# Patient Record
Sex: Female | Born: 1950 | Race: White | Marital: Married | Smoking: Never smoker
Health system: Southern US, Community
[De-identification: ages and names within clinical notes are randomized; demographics above are authoritative.]

## PROBLEM LIST (undated history)

## (undated) DIAGNOSIS — K5790 Diverticulosis of intestine, part unspecified, without perforation or abscess without bleeding: Secondary | ICD-10-CM

## (undated) DIAGNOSIS — E782 Mixed hyperlipidemia: Secondary | ICD-10-CM

## (undated) DIAGNOSIS — M810 Age-related osteoporosis without current pathological fracture: Secondary | ICD-10-CM

## (undated) DIAGNOSIS — F419 Anxiety disorder, unspecified: Secondary | ICD-10-CM

## (undated) DIAGNOSIS — K219 Gastro-esophageal reflux disease without esophagitis: Secondary | ICD-10-CM

## (undated) DIAGNOSIS — E559 Vitamin D deficiency, unspecified: Secondary | ICD-10-CM

## (undated) DIAGNOSIS — T7840XA Allergy, unspecified, initial encounter: Secondary | ICD-10-CM

## (undated) DIAGNOSIS — E039 Hypothyroidism, unspecified: Secondary | ICD-10-CM

## (undated) DIAGNOSIS — R011 Cardiac murmur, unspecified: Secondary | ICD-10-CM

## (undated) DIAGNOSIS — Z8719 Personal history of other diseases of the digestive system: Secondary | ICD-10-CM

## (undated) HISTORY — DX: Vitamin D deficiency, unspecified: E55.9

## (undated) HISTORY — DX: Gastro-esophageal reflux disease without esophagitis: K21.9

## (undated) HISTORY — DX: Cardiac murmur, unspecified: R01.1

## (undated) HISTORY — DX: Age-related osteoporosis without current pathological fracture: M81.0

## (undated) HISTORY — PX: TONSILLECTOMY: SUR1361

## (undated) HISTORY — DX: Mixed hyperlipidemia: E78.2

## (undated) HISTORY — DX: Anxiety disorder, unspecified: F41.9

## (undated) HISTORY — DX: Diverticulosis of intestine, part unspecified, without perforation or abscess without bleeding: K57.90

## (undated) HISTORY — DX: Allergy, unspecified, initial encounter: T78.40XA

## (undated) HISTORY — PX: EYE SURGERY: SHX253

## (undated) HISTORY — PX: BREAST BIOPSY: SHX20

## (undated) HISTORY — DX: Hypothyroidism, unspecified: E03.9

## (undated) HISTORY — DX: Personal history of other diseases of the digestive system: Z87.19

---
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
## 1998-08-28 DIAGNOSIS — E039 Hypothyroidism, unspecified: Secondary | ICD-10-CM

## 1998-08-28 HISTORY — DX: Hypothyroidism, unspecified: E03.9

## 2016-07-10 ENCOUNTER — Ambulatory Visit (INDEPENDENT_AMBULATORY_CARE_PROVIDER_SITE_OTHER): Payer: 59 | Admitting: Internal Medicine

## 2016-07-10 ENCOUNTER — Encounter: Payer: Self-pay | Admitting: Internal Medicine

## 2016-07-10 VITALS — BP 110/68 | HR 71 | Temp 97.5°F | Resp 16 | Ht 60.5 in | Wt 107.8 lb

## 2016-07-10 DIAGNOSIS — E559 Vitamin D deficiency, unspecified: Secondary | ICD-10-CM | POA: Diagnosis not present

## 2016-07-10 DIAGNOSIS — Z79899 Other long term (current) drug therapy: Secondary | ICD-10-CM

## 2016-07-10 DIAGNOSIS — R5383 Other fatigue: Secondary | ICD-10-CM

## 2016-07-10 DIAGNOSIS — E782 Mixed hyperlipidemia: Secondary | ICD-10-CM | POA: Diagnosis not present

## 2016-07-10 DIAGNOSIS — Z131 Encounter for screening for diabetes mellitus: Secondary | ICD-10-CM

## 2016-07-10 DIAGNOSIS — R030 Elevated blood-pressure reading, without diagnosis of hypertension: Secondary | ICD-10-CM

## 2016-07-10 DIAGNOSIS — E039 Hypothyroidism, unspecified: Secondary | ICD-10-CM | POA: Diagnosis not present

## 2016-07-10 DIAGNOSIS — Z0001 Encounter for general adult medical examination with abnormal findings: Secondary | ICD-10-CM

## 2016-07-10 NOTE — Patient Instructions (Signed)

## 2016-07-10 NOTE — Progress Notes (Signed)
[facility] Unk Pinto, M.D.    Isabel Tyler. Silverio Lay, P.A.-C      Starlyn Skeans, P.A.-C  [facility]                [address] [place], [place] [id number] Telephone [phone] Telefax [phone]  Preventative Visit & Comprehensive Evaluation &  Examination     This very nice 65 y.o. MWF presents to establish for evaluation & management of multiple medical co-morbidities. Patient has recently move her from [place] to be close to a daughter and new granddaughter. She has been on Thyroid replacement since circa 2000. She also relates she has been on Lexapro about 15 years and desires to continue it. She also relates hx/o treatment several times for Diverticulitis since 2012 with the last episode 6-12 months ago.      Patient denies k/o elevated BP and patient denies any cardiac symptoms as chest pain, palpitations, shortness of breath, dizziness or ankle swelling. She relates dx/o "Mitral Valve Prolapse" since 1981 and has had 2 nuclear stress tests negative in the past with the most recent in 2015. Today's BP is 110/68 at goal.     Patient does relate hx/o elevated lipid parameters, but reports no treatment was recommended other than diet.      Patient denies k/o elevated blood sugar and patient denies reactive hypoglycemic symptoms, visual blurring, diabetic polys, or paresthesias.        Finally, patient denies k/o ever having her Vitamin D level checked. Outpatient Encounter Prescriptions as of 07/10/2016  Medication Sig  . aspirin 81 MG Chew by mouth daily.  Marland Kitchen LEXAPRO 20 MG tablet Take 20 mg by mouth daily.  Marland Kitchen FLAXSEED OIL  Take 1,000 mg by mouth.  . levothyroxine  50 MCG tablet Take 50 mcg by mouth daily before breakfast.  . Omega-3  FISH OIL 1000 MG  Take by mouth.   Allergies - none PMH (+) Hypothyroidism, Hyperlipidemia, Seasonal Allergic Rhinitis  No past surgical history on file.    Social History  Substance Use Topics  . Smoking status: Never Smoker  . Smokeless tobacco: Not on file  . Alcohol use Not on file    ROS Constitutional: Denies fever, chills, weight loss/gain, headaches, insomnia,  night sweats, and change in appetite. Does c/o fatigue. Eyes: Denies redness, blurred vision, diplopia, discharge, itchy, watery eyes.  ENT: Denies discharge, congestion, post nasal drip, epistaxis, sore throat, earache, hearing loss, dental pain, Tinnitus, Vertigo, Sinus pain, snoring.  Cardio: Denies chest pain, palpitations, irregular heartbeat, syncope, dyspnea, diaphoresis, orthopnea, PND, claudication, edema Respiratory: denies cough, dyspnea, DOE, pleurisy, hoarseness, laryngitis, wheezing.  Gastrointestinal: Denies dysphagia, heartburn, reflux, water brash, pain, cramps, nausea, vomiting, bloating, diarrhea, constipation, hematemesis, melena, hematochezia, jaundice, hemorrhoids Genitourinary: Denies dysuria, frequency, urgency, nocturia, hesitancy, discharge, hematuria, flank pain Breast: Breast lumps, nipple discharge, bleeding.  Musculoskeletal: Denies arthralgia, myalgia, stiffness, Jt. Swelling, pain, limp, and strain/sprain. Denies falls. Skin: Denies puritis, rash, hives, warts, acne, eczema, changing in skin lesion Neuro: No weakness, tremor, incoordination, spasms, paresthesia, pain Psychiatric: Denies confusion, memory loss, sensory loss. Denies Depression. Endocrine: Denies change in weight, skin, hair change, nocturia, and paresthesia, diabetic polys, visual blurring, hyper / hypo glycemic episodes.  Heme/Lymph: No excessive bleeding, bruising, enlarged lymph nodes.  Physical Exam  BP 110/68   Pulse 71   Temp 97.5 F (36.4 C)   Resp  16   Ht 5' 0.5" (1.537 m)   Wt 107 lb 12.8 oz (48.9 kg)   SpO2 95%   BMI 20.71 kg/m   General Appearance: Well nourished and in no apparent distress.  Eyes: PERRLA, EOMs, conjunctiva no swelling or erythema, normal  fundi and vessels. Sinuses: No frontal/maxillary tenderness ENT/Mouth: EACs patent / TMs  nl. Nares clear without erythema, swelling, mucoid exudates. Oral hygiene is good. No erythema, swelling, or exudate. Tongue normal, non-obstructing. Tonsils not swollen or erythematous. Hearing normal.  Neck: Supple, thyroid normal. No bruits, nodes or JVD. Respiratory: Respiratory effort normal.  BS equal and clear bilateral without rales, rhonci, wheezing or stridor. Cardio: Heart sounds are normal with regular rate and rhythm and no murmurs, rubs or gallops. Peripheral pulses are normal and equal bilaterally without edema. No aortic or femoral bruits. Chest: symmetric with normal excursions and percussion. Lymphatics: Non tender without lymphadenopathy.  Musculoskeletal: Full ROM all peripheral extremities, joint stability, 5/5 strength, and normal gait. Skin: Warm and dry without rashes, lesions, cyanosis, clubbing or  ecchymosis.  Neuro: Cranial nerves intact, reflexes equal bilaterally. Normal muscle tone, no cerebellar symptoms. Sensation intact.  Pysch: Alert and oriented X 3, normal affect, Insight and Judgment appropriate.   Assessment and Plan  1.  Preventative Screening Examination  - Urinalysis, Routine w reflex microscopic (not at [facility]) - Iron and TIBC - Vitamin B12 - CBC with Differential/Platelet - BASIC METABOLIC PANEL WITH GFR - Hepatic function panel - Magnesium - Lipid panel - TSH - Hemoglobin A1c - Insulin, random - VITAMIN D 25 Hydroxy   2. Elevated BP, screening  - TSH  3. Mixed hyperlipidemia  - Lipid panel - TSH  4. Diabetes mellitus screening  - Hemoglobin A1c - Insulin, random  5. Vitamin D deficiency  - VITAMIN D 25 Hydroxy   6. Medication management  - Urinalysis, Routine w reflex microscopic - CBC with Differential/Platelet - BASIC METABOLIC PANEL WITH GFR - Hepatic function panel - Magnesium  7. Fatigue  - Iron and TIBC - Vitamin B12 -  CBC with Differential/Platelet  8. Acquired hypothyroidism  - TSH - ROV 3 months       Continue prudent diet as discussed, weight control, BP monitoring, regular exercise, and medications. Discussed med's effects and SE's. Screening labs and tests as requested with regular follow-up as recommended. Over 40 minutes of exam, counseling, chart review and high complex critical decision making was performed.

## 2016-07-11 ENCOUNTER — Other Ambulatory Visit: Payer: Self-pay | Admitting: Internal Medicine

## 2016-07-11 DIAGNOSIS — N3000 Acute cystitis without hematuria: Secondary | ICD-10-CM

## 2016-07-11 LAB — BASIC METABOLIC PANEL WITH GFR
BUN: 21 mg/dL (ref 7–25)
CALCIUM: 9.6 mg/dL (ref 8.6–10.4)
CO2: 28 mmol/L (ref 20–31)
Chloride: 99 mmol/L (ref 98–110)
Creat: 0.78 mg/dL (ref 0.50–0.99)
GFR, EST NON AFRICAN AMERICAN: 81 mL/min (ref >=60)
Glucose, Bld: 85 mg/dL (ref 65–99)
POTASSIUM: 4.5 mmol/L (ref 3.5–5.3)
Sodium: 136 mmol/L (ref 135–146)

## 2016-07-11 LAB — URINALYSIS, MICROSCOPIC ONLY
Bacteria, UA: NONE SEEN [HPF]
CRYSTALS: NONE SEEN [HPF]
Casts: NONE SEEN [LPF]
Yeast: NONE SEEN [HPF]

## 2016-07-11 LAB — URINALYSIS, ROUTINE W REFLEX MICROSCOPIC
Bilirubin Urine: NEGATIVE
GLUCOSE, UA: NEGATIVE
Nitrite: NEGATIVE
PH: 6 (ref 5.0–8.0)
Protein, ur: NEGATIVE
SPECIFIC GRAVITY, URINE: 1.019 (ref 1.001–1.035)

## 2016-07-11 LAB — CBC WITH DIFFERENTIAL/PLATELET
BASOS PCT: 1 %
Basophils Absolute: 69 cells/uL (ref 0–200)
Eosinophils Absolute: 69 cells/uL (ref 15–500)
Eosinophils Relative: 1 %
HCT: 38.7 % (ref 35.0–45.0)
Hemoglobin: 12.9 g/dL (ref 11.7–15.5)
Lymphocytes Relative: 35 %
Lymphs Abs: 2415 cells/uL (ref 850–3900)
MCH: 32.3 pg (ref 27.0–33.0)
MCHC: 33.3 g/dL (ref 32.0–36.0)
MCV: 96.8 fL (ref 80.0–100.0)
MONOS PCT: 5 %
MPV: 9.1 fL (ref 7.5–12.5)
Monocytes Absolute: 345 cells/uL (ref 200–950)
NEUTROS PCT: 58 %
Neutro Abs: 4002 cells/uL (ref 1500–7800)
PLATELETS: 259 10*3/uL (ref 140–400)
RBC: 4 MIL/uL (ref 3.80–5.10)
RDW: 13 % (ref 11.0–15.0)
WBC: 6.9 10*3/uL (ref 3.8–10.8)

## 2016-07-11 LAB — HEPATIC FUNCTION PANEL
ALBUMIN: 4.3 g/dL (ref 3.6–5.1)
ALT: 18 U/L (ref 6–29)
AST: 22 U/L (ref 10–35)
Alkaline Phosphatase: 62 U/L (ref 33–130)
BILIRUBIN INDIRECT: 0.4 mg/dL (ref 0.2–1.2)
BILIRUBIN TOTAL: 0.5 mg/dL (ref 0.2–1.2)
Bilirubin, Direct: 0.1 mg/dL (ref <=0.2)
TOTAL PROTEIN: 7.1 g/dL (ref 6.1–8.1)

## 2016-07-11 LAB — VITAMIN B12: VITAMIN B 12: 1209 pg/mL — AB (ref 200–1100)

## 2016-07-11 LAB — HEMOGLOBIN A1C
HEMOGLOBIN A1C: 5.1 % (ref <5.7)
MEAN PLASMA GLUCOSE: 100 mg/dL

## 2016-07-11 LAB — IRON AND TIBC
%SAT: 34 % (ref 11–50)
Iron: 101 ug/dL (ref 45–160)
TIBC: 300 ug/dL (ref 250–450)
UIBC: 199 ug/dL (ref 125–400)

## 2016-07-11 LAB — LIPID PANEL
CHOLESTEROL: 241 mg/dL — AB (ref <200)
HDL: 83 mg/dL (ref >50)
LDL Cholesterol: 143 mg/dL — ABNORMAL HIGH (ref <100)
TRIGLYCERIDES: 74 mg/dL (ref <150)
Total CHOL/HDL Ratio: 2.9 Ratio (ref <5.0)
VLDL: 15 mg/dL (ref <30)

## 2016-07-11 LAB — TSH: TSH: 2.41 mIU/L

## 2016-07-11 LAB — INSULIN, RANDOM: INSULIN: 1.8 u[IU]/mL — AB (ref 2.0–19.6)

## 2016-07-11 LAB — VITAMIN D 25 HYDROXY (VIT D DEFICIENCY, FRACTURES): Vit D, 25-Hydroxy: 25 ng/mL — ABNORMAL LOW (ref 30–100)

## 2016-07-11 LAB — MAGNESIUM: MAGNESIUM: 2.1 mg/dL (ref 1.5–2.5)

## 2016-07-11 MED ORDER — CIPROFLOXACIN HCL 250 MG PO TABS: ORAL_TABLET | ORAL | 0 refills | Status: DC

## 2016-07-17 ENCOUNTER — Other Ambulatory Visit: Payer: Self-pay | Admitting: Internal Medicine

## 2016-07-17 MED ORDER — SULFAMETHOXAZOLE-TRIMETHOPRIM 800-160 MG PO TABS: 1.0000 | ORAL_TABLET | Freq: Two times a day (BID) | ORAL | 0 refills | Status: DC

## 2016-08-11 ENCOUNTER — Other Ambulatory Visit: Payer: 59

## 2016-08-11 DIAGNOSIS — N3000 Acute cystitis without hematuria: Secondary | ICD-10-CM

## 2016-08-11 LAB — URINALYSIS, ROUTINE W REFLEX MICROSCOPIC
Bilirubin Urine: NEGATIVE
Glucose, UA: NEGATIVE
Hgb urine dipstick: NEGATIVE
KETONES UR: NEGATIVE
LEUKOCYTES UA: NEGATIVE
NITRITE: NEGATIVE
PH: 7.5 (ref 5.0–8.0)
Protein, ur: NEGATIVE
SPECIFIC GRAVITY, URINE: 1.013 (ref 1.001–1.035)

## 2016-08-12 LAB — URINE CULTURE: ORGANISM ID, BACTERIA: NO GROWTH

## 2016-09-04 ENCOUNTER — Telehealth: Payer: Self-pay | Admitting: *Deleted

## 2016-09-04 ENCOUNTER — Other Ambulatory Visit: Payer: Self-pay | Admitting: Internal Medicine

## 2016-09-04 ENCOUNTER — Other Ambulatory Visit: Payer: Self-pay | Admitting: *Deleted

## 2016-09-04 MED ORDER — AMOXICILLIN 250 MG PO CAPS: ORAL_CAPSULE | ORAL | 0 refills | Status: DC

## 2016-09-04 MED ORDER — LEVOTHYROXINE SODIUM 50 MCG PO TABS: 50.0000 ug | ORAL_TABLET | Freq: Every day | ORAL | 1 refills | Status: DC

## 2016-09-04 NOTE — Telephone Encounter (Signed)
Patient called and reported she is having blood in her urine since last PM and has painful urination. Patient requested an antibiotic.  Per Dr Melford Aase, an RX for Amoxil has been sent to [facility] and patient was advised to take Vitamin C 1000 mg BID to acidify her urine.  Patiwnt was advised to call back to schedule a NV for a UA and C/s urine, if symptoms do not improve.

## 2016-09-08 ENCOUNTER — Other Ambulatory Visit: Payer: Self-pay | Admitting: *Deleted

## 2016-09-08 ENCOUNTER — Other Ambulatory Visit: Payer: No Typology Code available for payment source

## 2016-09-08 DIAGNOSIS — R300 Dysuria: Secondary | ICD-10-CM

## 2016-09-08 MED ORDER — LEVOTHYROXINE SODIUM 75 MCG PO TABS: 75.0000 ug | ORAL_TABLET | Freq: Every day | ORAL | 0 refills | Status: DC

## 2016-09-09 LAB — URINALYSIS, ROUTINE W REFLEX MICROSCOPIC
Bilirubin Urine: NEGATIVE
Glucose, UA: NEGATIVE
Ketones, ur: NEGATIVE
NITRITE: NEGATIVE
PH: 7 (ref 5.0–8.0)
Protein, ur: NEGATIVE
SPECIFIC GRAVITY, URINE: 1.006 (ref 1.001–1.035)

## 2016-09-09 LAB — URINALYSIS, MICROSCOPIC ONLY
Bacteria, UA: NONE SEEN [HPF]
CASTS: NONE SEEN [LPF]
Crystals: NONE SEEN [HPF]
Yeast: NONE SEEN [HPF]

## 2016-09-10 LAB — URINE CULTURE

## 2016-09-11 ENCOUNTER — Other Ambulatory Visit: Payer: Self-pay | Admitting: Internal Medicine

## 2016-09-11 DIAGNOSIS — R3982 Chronic bladder pain: Secondary | ICD-10-CM

## 2016-10-23 ENCOUNTER — Ambulatory Visit (INDEPENDENT_AMBULATORY_CARE_PROVIDER_SITE_OTHER): Payer: 59 | Admitting: Internal Medicine

## 2016-10-23 ENCOUNTER — Encounter: Payer: Self-pay | Admitting: Internal Medicine

## 2016-10-23 VITALS — BP 122/78 | HR 70 | Temp 97.9°F | Wt 113.0 lb

## 2016-10-23 DIAGNOSIS — F329 Major depressive disorder, single episode, unspecified: Secondary | ICD-10-CM

## 2016-10-23 DIAGNOSIS — E039 Hypothyroidism, unspecified: Secondary | ICD-10-CM | POA: Insufficient documentation

## 2016-10-23 DIAGNOSIS — F3341 Major depressive disorder, recurrent, in partial remission: Secondary | ICD-10-CM | POA: Insufficient documentation

## 2016-10-23 DIAGNOSIS — K5730 Diverticulosis of large intestine without perforation or abscess without bleeding: Secondary | ICD-10-CM | POA: Insufficient documentation

## 2016-10-23 DIAGNOSIS — E782 Mixed hyperlipidemia: Secondary | ICD-10-CM | POA: Insufficient documentation

## 2016-10-23 DIAGNOSIS — N3090 Cystitis, unspecified without hematuria: Secondary | ICD-10-CM

## 2016-10-23 DIAGNOSIS — Z79899 Other long term (current) drug therapy: Secondary | ICD-10-CM | POA: Diagnosis not present

## 2016-10-23 DIAGNOSIS — F3342 Major depressive disorder, recurrent, in full remission: Secondary | ICD-10-CM | POA: Insufficient documentation

## 2016-10-23 DIAGNOSIS — F32A Depression, unspecified: Secondary | ICD-10-CM

## 2016-10-23 LAB — BASIC METABOLIC PANEL WITH GFR
BUN: 14 mg/dL (ref 7–25)
CHLORIDE: 103 mmol/L (ref 98–110)
CO2: 28 mmol/L (ref 20–31)
Calcium: 9.5 mg/dL (ref 8.6–10.4)
Creat: 0.73 mg/dL (ref 0.50–0.99)
GFR, EST NON AFRICAN AMERICAN: 88 mL/min (ref >=60)
Glucose, Bld: 62 mg/dL — ABNORMAL LOW (ref 65–99)
POTASSIUM: 4.5 mmol/L (ref 3.5–5.3)
Sodium: 139 mmol/L (ref 135–146)

## 2016-10-23 LAB — LIPID PANEL
Cholesterol: 222 mg/dL — ABNORMAL HIGH (ref <200)
HDL: 77 mg/dL (ref >50)
LDL CALC: 133 mg/dL — AB (ref <100)
TRIGLYCERIDES: 59 mg/dL (ref <150)
Total CHOL/HDL Ratio: 2.9 Ratio (ref <5.0)
VLDL: 12 mg/dL (ref <30)

## 2016-10-23 LAB — CBC WITH DIFFERENTIAL/PLATELET
BASOS PCT: 1 %
Basophils Absolute: 43 cells/uL (ref 0–200)
EOS ABS: 86 {cells}/uL (ref 15–500)
Eosinophils Relative: 2 %
HCT: 40.5 % (ref 35.0–45.0)
Hemoglobin: 13.5 g/dL (ref 11.7–15.5)
LYMPHS PCT: 35 %
Lymphs Abs: 1505 cells/uL (ref 850–3900)
MCH: 31.8 pg (ref 27.0–33.0)
MCHC: 33.3 g/dL (ref 32.0–36.0)
MCV: 95.5 fL (ref 80.0–100.0)
MONOS PCT: 8 %
MPV: 9.5 fL (ref 7.5–12.5)
Monocytes Absolute: 344 cells/uL (ref 200–950)
NEUTROS ABS: 2322 {cells}/uL (ref 1500–7800)
Neutrophils Relative %: 54 %
PLATELETS: 256 10*3/uL (ref 140–400)
RBC: 4.24 MIL/uL (ref 3.80–5.10)
RDW: 13.2 % (ref 11.0–15.0)
WBC: 4.3 10*3/uL (ref 3.8–10.8)

## 2016-10-23 LAB — HEPATIC FUNCTION PANEL
ALBUMIN: 4.2 g/dL (ref 3.6–5.1)
ALK PHOS: 62 U/L (ref 33–130)
ALT: 19 U/L (ref 6–29)
AST: 21 U/L (ref 10–35)
Bilirubin, Direct: 0.1 mg/dL (ref <=0.2)
Indirect Bilirubin: 0.4 mg/dL (ref 0.2–1.2)
TOTAL PROTEIN: 7.2 g/dL (ref 6.1–8.1)
Total Bilirubin: 0.5 mg/dL (ref 0.2–1.2)

## 2016-10-23 LAB — TSH: TSH: 1.92 mIU/L

## 2016-10-23 NOTE — Progress Notes (Signed)
Assessment and Plan:  Hyperlipidemia -encouraged high fiber diet -cont exercise as tolerated  Recurrent Cystitis -seeing Dr. Mechele Collin -recheck for UTI -if no UTI can try 25 mg myrbetriq samples  Diverticulosis -not currently active  Hypothyroidism -TSH -cont levothyroxine  Continue diet and meds as discussed. Further disposition pending results of labs.  HPI 66 y.o. female  presents for 3 month follow up with hypertension, hyperlipidemia, prediabetes and vitamin D.   Her blood pressure has been controlled at home, today their BP is BP: 122/78.   She does workout. She denies chest pain, shortness of breath, dizziness.  She walks on a regular basis and she is taking care of her granddaughter.     She is not on cholesterol medication and denies myalgias. Her cholesterol is at goal. The cholesterol last visit was:   Lab Results  Component Value Date   CHOL 241 (H) 07/10/2016   HDL 83 07/10/2016   LDLCALC 143 (H) 07/10/2016   TRIG 74 07/10/2016   CHOLHDL 2.9 07/10/2016     She has been working on diet and exercise for prediabetes, and denies foot ulcerations, hyperglycemia, hypoglycemia , increased appetite, nausea, paresthesia of the feet, polydipsia, polyuria, visual disturbances, vomiting and weight loss. Last A1C in the office was:  Lab Results  Component Value Date   HGBA1C 5.1 07/10/2016    Patient is on Vitamin D supplement.  Lab Results  Component Value Date   VD25OH 25 (L) 07/10/2016     She reports that she has been having some pain with urination in January.  She reports that she was started on 1 antibiotic and she was switched to another antibiotic.  She reports that she did have some worsening and she started passing blood.  She reports that she was in agony.  She was eventually referred to Dr. Mechele Collin.  She did have a cystoscopy.  She was put on another antibioitic and then she had a recheck and she was told she still had a UTI.  She has not been checked since  then.  He told her that if she continues to have UTIs than she would likely need a prophylaxis daily.  She does not want to be on medications.  She reports that she has seen a urologist at [place] in the spring.  She had a normal workup for hematuria.   She reports that she feels tired a lot.  She is not sure whether this is due to her thyroid being off or whether it is due to lifestyle changes watching her granddaughter.  She has also recently moved down here.  She feels like she is sleeping okay.  She does sometimes have trouble falling asleep.  She has tried melatonin or valyrian root.  They did not help at all.  She does get hyper with benadryl.  She is trying to walk every day.  She feels like this helps a little bit.    She reports that she does get diverticulitis a lot.  She had a span of 2 years where she had 6 flares.  She reports that she did even go to [facility] without much help.  She has made dietary changes and has not had any flares recently.    Current Medications:  Current Outpatient Prescriptions on File Prior to Visit  Medication Sig Dispense Refill  . aspirin 81 MG chewable tablet Chew by mouth daily.    Marland Kitchen escitalopram (LEXAPRO) 20 MG tablet Take 20 mg by mouth daily.    Marland Kitchen  Flaxseed, Linseed, (FLAXSEED OIL PO) Take 1,000 mg by mouth.    . levothyroxine (SYNTHROID, LEVOTHROID) 50 MCG tablet Take 1 tablet (50 mcg total) by mouth daily before breakfast. 90 tablet 1  . levothyroxine (SYNTHROID, LEVOTHROID) 75 MCG tablet Take 1 tablet (75 mcg total) by mouth daily before breakfast. 90 tablet 0  . Omega-3 Fatty Acids (FISH OIL) 1000 MG CAPS Take by mouth.     No current facility-administered medications on file prior to visit.     Medical History:  Past Medical History:  Diagnosis Date  . Anxiety   . Diverticular disease   . Hyperlipidemia, mixed   . Hypothyroidism 2000  . Vitamin D deficiency     Allergies:  Allergies  Allergen Reactions  . Ciprofloxacin     Alleged  tendonitis     Review of Systems:  Review of Systems  Constitutional: Positive for malaise/fatigue. Negative for chills and fever.  HENT: Negative for congestion, ear pain and sore throat.   Eyes: Negative.   Respiratory: Negative for cough, shortness of breath and wheezing.   Cardiovascular: Negative for chest pain, palpitations and leg swelling.  Gastrointestinal: Negative for abdominal pain, blood in stool, constipation, diarrhea, heartburn and melena.  Genitourinary: Negative.   Musculoskeletal: Negative.   Skin: Negative.   Neurological: Negative for dizziness, sensory change, loss of consciousness and headaches.  Psychiatric/Behavioral: Negative for depression. The patient is nervous/anxious. The patient does not have insomnia.     Family history- Review and unchanged  Social history- Review and unchanged  Physical Exam: BP 122/78   Pulse 70   Temp 97.9 F (36.6 C)   Wt 113 lb (51.3 kg)   SpO2 99%   BMI 21.71 kg/m  Wt Readings from Last 3 Encounters:  10/23/16 113 lb (51.3 kg)  07/10/16 107 lb 12.8 oz (48.9 kg)    General Appearance: Well nourished well developed, in no apparent distress. Eyes: PERRLA, EOMs, conjunctiva no swelling or erythema ENT/Mouth: Ear canals normal without obstruction, swelling, erythma, discharge.  TMs normal bilaterally.  Oropharynx moist, clear, without exudate, or postoropharyngeal swelling. Neck: Supple, thyroid normal,no cervical adenopathy  Respiratory: Respiratory effort normal, Breath sounds clear A&P without rhonchi, wheeze, or rale.  No retractions, no accessory usage. Cardio: RRR with no MRGs. Brisk peripheral pulses without edema.  Abdomen: Soft, + BS,  Non tender, no guarding, rebound, hernias, masses. Musculoskeletal: Full ROM, 5/5 strength, Normal gait Skin: Warm, dry without rashes, lesions, ecchymosis.  Neuro: Awake and oriented X 3, Cranial nerves intact. Normal muscle tone, no cerebellar symptoms. Psych: Normal affect,  Insight and Judgment appropriate.    Starlyn Skeans, PA-C 1:00 PM [facility]

## 2016-10-23 NOTE — Patient Instructions (Signed)
Please try taking 1 tablet of myrbetriq daily with food to see if this will decrease your urinary urgency and frequency.  Please wait to start this until you hear back from [place] about your culture.  Please try to avoid caffeine and alcohol as this will increase bladder irritation.  Please try taking 1,000 mcg daily of vitamin B12 to see if this will help your energy levels.  You can take 1,000 mg of Vitamin C up to 3 times daily to help prevent UTIs.  Please increase leafy green veggies and fruits to help you lower cholesterol.

## 2016-10-24 LAB — URINE CULTURE

## 2016-10-24 LAB — URINALYSIS, ROUTINE W REFLEX MICROSCOPIC
BILIRUBIN URINE: NEGATIVE
Glucose, UA: NEGATIVE
HGB URINE DIPSTICK: NEGATIVE
KETONES UR: NEGATIVE
Leukocytes, UA: NEGATIVE
NITRITE: NEGATIVE
PH: 7 (ref 5.0–8.0)
Protein, ur: NEGATIVE
Specific Gravity, Urine: 1.009 (ref 1.001–1.035)

## 2016-10-25 ENCOUNTER — Ambulatory Visit: Payer: Self-pay | Admitting: Internal Medicine

## 2016-11-01 ENCOUNTER — Telehealth: Payer: Self-pay | Admitting: *Deleted

## 2016-11-01 MED ORDER — MIRABEGRON ER 50 MG PO TB24
50.0000 mg | ORAL_TABLET | Freq: Every day | ORAL | 0 refills | Status: DC
Start: 2016-11-01 — End: 2016-11-13

## 2016-11-01 NOTE — Telephone Encounter (Signed)
Patient called stating she has completed 9 days worth of the Myrbetriq Rx and it does not seem to be working very effectively.  Per Starlyn Skeans, PA-C, Pt was given 25 mg samples to start and since not working very effectively we will place 50 mg samples up front for pt to pick up and try.  Patient expressed understanding and I advised her to call us to let us know if the 50 mg are working well, or if we may need to try another therapy for symptoms.

## 2016-11-13 ENCOUNTER — Other Ambulatory Visit: Payer: Self-pay | Admitting: *Deleted

## 2016-11-13 MED ORDER — MIRABEGRON ER 50 MG PO TB24: 50.0000 mg | ORAL_TABLET | Freq: Every day | ORAL | 3 refills | Status: DC

## 2016-11-21 ENCOUNTER — Other Ambulatory Visit: Payer: Self-pay | Admitting: Internal Medicine

## 2016-11-21 DIAGNOSIS — Z1231 Encounter for screening mammogram for malignant neoplasm of breast: Secondary | ICD-10-CM

## 2016-12-05 ENCOUNTER — Emergency Department (HOSPITAL_COMMUNITY): Payer: BLUE CROSS/BLUE SHIELD

## 2016-12-05 ENCOUNTER — Encounter (HOSPITAL_COMMUNITY): Payer: Self-pay | Admitting: Emergency Medicine

## 2016-12-05 ENCOUNTER — Emergency Department (HOSPITAL_COMMUNITY)
Admission: EM | Admit: 2016-12-05 | Discharge: 2016-12-05 | Disposition: A | Discharge status: Home / Self Care | Payer: BLUE CROSS/BLUE SHIELD | Attending: Emergency Medicine | Admitting: Emergency Medicine

## 2016-12-05 DIAGNOSIS — Z7982 Long term (current) use of aspirin: Secondary | ICD-10-CM | POA: Insufficient documentation

## 2016-12-05 DIAGNOSIS — R072 Precordial pain: Secondary | ICD-10-CM | POA: Diagnosis not present

## 2016-12-05 DIAGNOSIS — Z79899 Other long term (current) drug therapy: Secondary | ICD-10-CM | POA: Diagnosis not present

## 2016-12-05 DIAGNOSIS — R1013 Epigastric pain: Secondary | ICD-10-CM

## 2016-12-05 DIAGNOSIS — E039 Hypothyroidism, unspecified: Secondary | ICD-10-CM | POA: Insufficient documentation

## 2016-12-05 LAB — URINALYSIS, ROUTINE W REFLEX MICROSCOPIC
BACTERIA UA: NONE SEEN
BILIRUBIN URINE: NEGATIVE
Glucose, UA: NEGATIVE mg/dL
KETONES UR: NEGATIVE mg/dL
Nitrite: NEGATIVE
Protein, ur: NEGATIVE mg/dL
SPECIFIC GRAVITY, URINE: 1.004 — AB (ref 1.005–1.030)
SQUAMOUS EPITHELIAL / LPF: NONE SEEN
pH: 8 (ref 5.0–8.0)

## 2016-12-05 LAB — CBC
HCT: 39.6 % (ref 36.0–46.0)
HEMOGLOBIN: 13.4 g/dL (ref 12.0–15.0)
MCH: 31.5 pg (ref 26.0–34.0)
MCHC: 33.8 g/dL (ref 30.0–36.0)
MCV: 93 fL (ref 78.0–100.0)
Platelets: 234 10*3/uL (ref 150–400)
RBC: 4.26 MIL/uL (ref 3.87–5.11)
RDW: 12.7 % (ref 11.5–15.5)
WBC: 3.9 10*3/uL — ABNORMAL LOW (ref 4.0–10.5)

## 2016-12-05 LAB — HEPATIC FUNCTION PANEL
ALT: 27 U/L (ref 14–54)
AST: 28 U/L (ref 15–41)
Albumin: 4.2 g/dL (ref 3.5–5.0)
Alkaline Phosphatase: 64 U/L (ref 38–126)
Bilirubin, Direct: <0.1 mg/dL — ABNORMAL LOW (ref 0.1–0.5)
Total Bilirubin: 0.9 mg/dL (ref 0.3–1.2)
Total Protein: 7.3 g/dL (ref 6.5–8.1)

## 2016-12-05 LAB — BASIC METABOLIC PANEL
ANION GAP: 5 (ref 5–15)
BUN: 17 mg/dL (ref 6–20)
CALCIUM: 9.3 mg/dL (ref 8.9–10.3)
CO2: 30 mmol/L (ref 22–32)
CREATININE: 0.68 mg/dL (ref 0.44–1.00)
Chloride: 102 mmol/L (ref 101–111)
GFR calc Af Amer: >60 mL/min (ref >60)
GFR calc non Af Amer: >60 mL/min (ref >60)
GLUCOSE: 77 mg/dL (ref 65–99)
Potassium: 4.2 mmol/L (ref 3.5–5.1)
Sodium: 137 mmol/L (ref 135–145)

## 2016-12-05 LAB — I-STAT TROPONIN, ED
TROPONIN I, POC: 0 ng/mL (ref 0.00–0.08)
Troponin i, poc: 0 ng/mL (ref 0.00–0.08)

## 2016-12-05 LAB — LIPASE, BLOOD: Lipase: 22 U/L (ref 11–51)

## 2016-12-05 MED ORDER — PANTOPRAZOLE SODIUM 20 MG PO TBEC
20.0000 mg | DELAYED_RELEASE_TABLET | Freq: Once | ORAL | Status: AC
  Administered 2016-12-05: 20 mg via ORAL
  Filled 2016-12-05: qty 1

## 2016-12-05 MED ORDER — GI COCKTAIL ~~LOC~~
30.0000 mL | Freq: Once | ORAL | Status: AC
Start: 2016-12-05 — End: 2016-12-05
  Administered 2016-12-05: 30 mL via ORAL
  Filled 2016-12-05: qty 30

## 2016-12-05 MED ORDER — OMEPRAZOLE 20 MG PO CPDR: 20.0000 mg | DELAYED_RELEASE_CAPSULE | Freq: Every day | ORAL | 0 refills | Status: DC

## 2016-12-05 NOTE — ED Triage Notes (Signed)
Patient c/o lower chest pain that started this morning when she woke up. Patient reports lots of belching then later on pain radiated down into abd.

## 2016-12-05 NOTE — ED Provider Notes (Signed)
[facility] DEPT Provider Note   CSN: [id number] Arrival date & time: 12/05/16  0908     History   Chief Complaint Chief Complaint  Patient presents with  . Chest Pain  . Abdominal Pain    HPI Isabel Tyler is a 66 y.o. female.  HPI Patient reports that she was awakened during the night with pain in her epigastrium. She reports it was an extreme tightness. She also felt some associated pain radiating into her abdomen. She thought perhaps it was gas and bloating and tried some simethicone and Coca-Cola. That did precipitate some belching. The quality and severity of epigastric pain has alleviated some but patient still has some residual abdominal pain. The patient reports that she has had 2 stress tests in the past. Last one she estimates to be about 2 years ago. Patient took a baby aspirin prior to arrival. No lower extremity swelling or pain. No recent fever, cough or mucus production. Past Medical History:  Diagnosis Date  . Anxiety   . Diverticular disease   . Hyperlipidemia, mixed   . Hypothyroidism 2000  . Vitamin D deficiency     Patient Active Problem List   Diagnosis Date Noted  . Hyperlipidemia LDL goal <130 10/23/2016  . Hypothyroidism 10/23/2016  . Depression 10/23/2016  . Diverticulosis of colon without hemorrhage 10/23/2016    History reviewed. No pertinent surgical history.  OB History    No data available       Home Medications    Prior to Admission medications   Medication Sig Start Date End Date Taking? Authorizing Provider  aspirin EC 81 MG tablet Take 81 mg by mouth daily.   Yes Historical Provider, MD  escitalopram (LEXAPRO) 20 MG tablet Take 20 mg by mouth daily.   Yes Historical Provider, MD  Flaxseed, Linseed, (FLAXSEED OIL) 1200 MG CAPS Take 1 capsule by mouth daily.   Yes Historical Provider, MD  levothyroxine (SYNTHROID, LEVOTHROID) 50 MCG tablet Take 1 tablet (50 mcg total) by mouth daily before breakfast. Patient taking  differently: Take 50 mcg by mouth every other day. Alternating days with 75 mcg 09/04/16  Yes Unk Pinto, MD  levothyroxine (SYNTHROID, LEVOTHROID) 75 MCG tablet Take 1 tablet (75 mcg total) by mouth daily before breakfast. Patient taking differently: Take 75 mcg by mouth every other day. Alternating days with 50 mcg 09/08/16  Yes Unk Pinto, MD  loratadine (CLARITIN) 10 MG tablet Take 10 mg by mouth daily as needed for allergies.   Yes Historical Provider, MD  mirabegron ER (MYRBETRIQ) 50 MG TB24 tablet Take 1 tablet (50 mg total) by mouth daily. 11/13/16  Yes Unk Pinto, MD  Omega-3 Fatty Acids (FISH OIL) 1200 MG CAPS Take 1 capsule by mouth daily.   Yes Historical Provider, MD  Probiotic Product (PROBIOTIC-10) CAPS Take 1 capsule by mouth daily.   Yes Historical Provider, MD  Simethicone 250 MG CAPS Take 250 mg by mouth daily as needed (for gas).   Yes Historical Provider, MD  Specialty Vitamins Products (ONE-A-DAY BONE STRENGTH PO) Take 1 tablet by mouth daily.   Yes Historical Provider, MD  omeprazole (PRILOSEC) 20 MG capsule Take 1 capsule (20 mg total) by mouth daily. 12/05/16   Charlesetta Shanks, MD    Family History Family History  Problem Relation Age of Onset  . Heart disease Mother   . Heart disease Father     Social History Social History  Substance Use Topics  . Smoking status: Never Smoker  . Smokeless tobacco:  Never Used  . Alcohol use Yes     Comment: occasional      Allergies   Ciprofloxacin   Review of Systems Review of Systems 10 Systems reviewed and are negative for acute change except as noted in the HPI.   Physical Exam Updated Vital Signs BP 105/66 (BP Location: Left Arm)   Pulse 65   Temp 97.5 F (36.4 C) (Oral)   Resp 16   Ht 5\' 1"  (1.549 m)   Wt 115 lb (52.2 kg)   SpO2 100%   BMI 21.73 kg/m   Physical Exam  Constitutional: She is oriented to person, place, and time. She appears well-developed and well-nourished. No distress.    HENT:  Head: Normocephalic and atraumatic.  Mouth/Throat: Oropharynx is clear and moist.  Eyes: Conjunctivae are normal.  Neck: Neck supple.  Cardiovascular: Normal rate and regular rhythm.   No murmur heard. Pulmonary/Chest: Effort normal and breath sounds normal. No respiratory distress.  Abdominal: Soft. She exhibits no distension. There is tenderness.  Mild epigastric tenderness to palpation. No guarding.  Musculoskeletal: She exhibits no edema or tenderness.  Neurological: She is alert and oriented to person, place, and time. No cranial nerve deficit. She exhibits normal muscle tone. Coordination normal.  Skin: Skin is warm and dry.  Psychiatric: She has a normal mood and affect.  Nursing note and vitals reviewed.    ED Treatments / Results  Labs (all labs ordered are listed, but only abnormal results are displayed) Labs Reviewed  CBC - Abnormal; Notable for the following:       Result Value   WBC 3.9 (*)    All other components within normal limits  URINALYSIS, ROUTINE W REFLEX MICROSCOPIC - Abnormal; Notable for the following:    Color, Urine COLORLESS (*)    Specific Gravity, Urine 1.004 (*)    Hgb urine dipstick SMALL (*)    Leukocytes, UA TRACE (*)    All other components within normal limits  HEPATIC FUNCTION PANEL - Abnormal; Notable for the following:    Bilirubin, Direct <0.1 (*)    All other components within normal limits  BASIC METABOLIC PANEL  LIPASE, BLOOD  I-STAT TROPOININ, ED  I-STAT TROPOININ, ED    EKG  EKG Interpretation  Date/Time:  Tuesday December 05 2016 09:16:38 EDT Ventricular Rate:  62 PR Interval:    QRS Duration: 90 QT Interval:  550 QTC Calculation: 559 R Axis:   -33 Text Interpretation:  Sinus rhythm Left axis deviation Abnormal R-wave progression, early transition Borderline T wave abnormalities Prolonged QT interval Baseline wander in lead(s) V5 agree. no acute ischemic appearance. no old comparison Confirmed by Johnney Killian, MD,  Jeannie Done [phone]) on 12/05/2016 10:38:20 AM       Radiology Dg Chest 2 View  Result Date: 12/05/2016 CLINICAL DATA:  Mid chest and upper abdominal pain pain beginning this morning. Nonsmoker. EXAM: CHEST  2 VIEW COMPARISON:  None in PACs FINDINGS: The lungs are hyperinflated with hemidiaphragm flattening. The heart and pulmonary vascularity are normal. The mediastinum is normal in width. There is no pleural effusion, pneumothorax, or pneumomediastinum. There is gentle S-shaped thoracolumbar curvature. IMPRESSION: Hyperinflation consistent with COPD or reactive airway disease. No pneumonia, CHF, nor other acute cardiopulmonary abnormality Electronically Signed   By: David  Martinique M.D.   On: 12/05/2016 09:31    Procedures Procedures (including critical care time)  Medications Ordered in ED Medications  pantoprazole (PROTONIX) EC tablet 20 mg (20 mg Oral Given 12/05/16 1115)  gi cocktail (  Maalox,Lidocaine,Donnatal) (30 mLs Oral Given 12/05/16 1115)     Initial Impression / Assessment and Plan / ED Course  I have reviewed the triage vital signs and the nursing notes.  Pertinent labs & imaging results that were available during my care of the patient were reviewed by me and considered in my medical decision making (see chart for details).      Final Clinical Impressions(s) / ED Diagnoses   Final diagnoses:  Precordial pain  Epigastric abdominal pain  Patient experienced epigastric pain and lower chest pain during the night. Symptoms began to resolve and patient had some residual central abdominal discomfort. At this time, 2 sets of cardiac enzymes are negative and patient's EKG does not have ischemic appearance. Pain sounds atypical for ischemia and this time the patient is safe for continued outpatient follow-up and repeat stress testing if indicated. Patient will be started on Prilosec. She does describe prior symptoms of GERD. She is counseled on signs and symptoms were strict return.  New  Prescriptions New Prescriptions   OMEPRAZOLE (PRILOSEC) 20 MG CAPSULE    Take 1 capsule (20 mg total) by mouth daily.     Charlesetta Shanks, MD 12/05/16 1311

## 2016-12-07 ENCOUNTER — Other Ambulatory Visit: Payer: Self-pay | Admitting: Internal Medicine

## 2016-12-12 ENCOUNTER — Ambulatory Visit (INDEPENDENT_AMBULATORY_CARE_PROVIDER_SITE_OTHER): Payer: 59 | Admitting: Internal Medicine

## 2016-12-12 ENCOUNTER — Encounter: Payer: Self-pay | Admitting: Internal Medicine

## 2016-12-12 VITALS — BP 110/68 | HR 72 | Temp 97.5°F | Resp 16 | Ht 60.05 in | Wt 113.4 lb

## 2016-12-12 DIAGNOSIS — R1011 Right upper quadrant pain: Secondary | ICD-10-CM

## 2016-12-12 DIAGNOSIS — N390 Urinary tract infection, site not specified: Secondary | ICD-10-CM | POA: Diagnosis not present

## 2016-12-12 DIAGNOSIS — N343 Urethral syndrome, unspecified: Secondary | ICD-10-CM | POA: Diagnosis not present

## 2016-12-12 DIAGNOSIS — K219 Gastro-esophageal reflux disease without esophagitis: Secondary | ICD-10-CM

## 2016-12-12 DIAGNOSIS — D1803 Hemangioma of intra-abdominal structures: Secondary | ICD-10-CM | POA: Diagnosis not present

## 2016-12-12 MED ORDER — DOXYCYCLINE HYCLATE 100 MG PO CAPS: ORAL_CAPSULE | ORAL | 0 refills | Status: DC

## 2016-12-12 NOTE — Progress Notes (Signed)
Subjective:    Patient ID: Isabel Tyler, female    DOB: 28-May-1951, 66 y.o.   MRN: [id number]  HPI  Patient is a very nice 66 yo MWF with essential neg PMH who was evaluated in the ER 12/05/2016 for CP & EG pain with a negative cardiac w/u and final dx/o GERD and Rx'd Omeprazole. Today she reports nor real sx's of heartburn, waterbrash or reflux, but describes more sx's of bloating and EG /upper abdominal bloating sensation. Review of records finds an CT abd/pelvis in Jan 2017 showing a 7 cm hemangioma of the R lobe of the liver and a small gallstone. She has has negative EGD's in the past and reports prior testing for H.pylori has been negative. She has taken and is still on Probiotics w/o any significant improvement. Also she has hx/o recurrent UTI and has had recent Cystoscopy by Dr Matilde Sprang and reports inflammation of her bladder, but she notes intermittent and frequent discomfort from her urethral area.  Medication Sig  . aspirin EC 81 MG tablet Take  daily.  Marland Kitchen escitalopram ( 20 MG tablet Take  daily.  Marland Kitchen FLAXSEED OIL 1200 MG  Take 1 capsule daily.  Marland Kitchen levothyroxine  50 MCG tablet Take every other day Alternating days with 75 mcg  . levothyroxine  75 MCG tablet TAKE 1 TABLET (75 MCG TOTAL) BY MOUTH DAILY  . loratadine  10 MG tablet Take 10 mg by mouth daily as needed for allergies.  Marland Kitchen MYRBETRIQ 50 MG  Take 1 tablet (50 mg total) by mouth daily.  . Omega-3 FISH OIL 1200 MG Take 1 capsule by mouth daily.  Marland Kitchen omeprazole  20 MG capsule Take 1 capsule (20 mg total) by mouth daily.  Marland Kitchen PROBIOTIC-10  Take 1 capsule by mouth daily.  . Simethicone 250 MG CAPS Take 250 mg by mouth daily as needed (for gas).  . ONE-A-DAY BONE STRENGTH  Take 1 tablet by mouth daily.   Allergies  Allergen Reactions  . Ciprofloxacin Other (See Comments)    Alleged tendonitis   Past Medical History:  Diagnosis Date  . Anxiety   . Diverticular disease   . Hyperlipidemia, mixed   . Hypothyroidism 2000  . Vitamin D  deficiency    Review of Systems  10 point systems review negative except as above.    Objective:   Physical Exam  BP 110/68   Pulse 72   Temp 97.5 F (36.4 C)   Resp 16   Ht 5' 0.05" (1.525 m)   Wt 113 lb 6.4 oz (51.4 kg)   BMI 22.11 kg/m   HEENT - Eac's patent. TM's Nl. EOM's full. PERRLA. NasoOroPharynx clear. Neck - supple. Nl Thyroid. Carotids 2+ & No bruits, nodes, JVD Chest - Clear BS. Cor - Nl HS. RRR w/o sig MGR.  Abd - No palpable organomegaly, masses or tenderness. BS nl. MS- FROM w/o deformities.Gait Nl. Neuro - Nl w/o focal abnormalities.    Assessment & Plan:   1. Abdominal pain, RUQ  - NM Hepato W/Eject Fract; Future - if negative then GI referral for consideration of SI Bacterial Overgrowth Syndrome  2. Gastroesophageal reflux disease, suspect  - discussed GERD /DASH diet   - hold Omeprazole for now pending further justification for use.   3. 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

## 2016-12-12 NOTE — Patient Instructions (Addendum)
++++++++++++++++++++++++++++ Urethritis, Adult Urethritis is an inflammation of the tube through which urine exits your bladder (urethra). What are the causes?  Urethritis is often caused by an infection in your urethra. The infection can be viral. The infection can also be bacterial. What are the signs or symptoms? Symptoms of urethritis are less noticeable in women than in men. These symptoms include:  Burning feeling when you urinate (dysuria).  Discharge from your urethra.  Blood in your urine (hematuria).  Urinating more than usual. How is this diagnosed? To confirm a diagnosis of urethritis, your health care provider will do the following:  Perform a physical exam.  Have you provide a sample of your urine for lab testing.   How is this treated? It is important to treat urethritis. Depending on the cause, untreated urethritis may lead to serious genital infections and possibly infertility. Urethritis caused by a bacterial infection is treated with antibiotic medicine.   ++++++++++++++++++++++ Heartburn Heartburn is a type of pain or discomfort that can happen in the throat or chest. It is often described as a burning pain. It may also cause a bad taste in the mouth. Heartburn may feel worse when you lie down or bend over, and it is often worse at night. Heartburn may be caused by stomach contents that move back up into the esophagus (reflux). Follow these instructions at home: Take these actions to decrease your discomfort and to help avoid complications. Diet   Follow a diet as recommended by your health care provider. This may involve avoiding foods and drinks such as:  Coffee and tea (with or without caffeine).  Drinks that contain alcohol.  Energy drinks and sports drinks.  Carbonated drinks or sodas.  Chocolate and cocoa.  Peppermint and mint flavorings.  Garlic and onions.    BANANAS    Spicy and acidic foods, including peppers, chili powder, curry  powder, vinegar, hot sauces, and barbecue sauce.  Citrus fruit juices and citrus fruits, such as oranges, lemons, and limes.  Tomato-based foods, such as red sauce, chili, salsa, and pizza with red sauce.  Fried and fatty foods, such as donuts, french fries, potato chips, and high-fat dressings.  High-fat meats, such as hot dogs and fatty cuts of red and white meats, such as rib eye steak, sausage, ham, and bacon.  High-fat dairy items, such as whole milk, butter, and cream cheese.  Eat small, frequent meals instead of large meals.  Avoid drinking large amounts of liquid with your meals.  Avoid eating meals during the 2-3 hours before bedtime.  Avoid lying down right after you eat.  Do not exercise right after you eat. General instructions   Pay attention to any changes in your symptoms.  Take over-the-counter and prescription medicines only as told by your health care provider. Do not take aspirin, ibuprofen, or other NSAIDs unless your health care provider told you to do so.  Do not use any tobacco products, including cigarettes, chewing tobacco, and e-cigarettes. If you need help quitting, ask your health care provider.  Wear loose-fitting clothing. Do not wear anything tight around your waist that causes pressure on your abdomen.  Raise (elevate) the head of your bed about 6 inches (15 cm).  Try to reduce your stress, such as with yoga or meditation. If you need help reducing stress, ask your health care provider.  If you are overweight, reduce your weight to an amount that is healthy for you. Ask your health care provider for guidance about a safe  weight loss goal.  Keep all follow-up visits as told by your health care provider. This is important. Contact a health care provider if:  You have new symptoms.  You have unexplained weight loss.  You have difficulty swallowing, or it hurts to swallow.  You have wheezing or a persistent cough.  Your symptoms do not  improve with treatment.  You have frequent heartburn for more than two weeks. Get help right away if:  You have pain in your arms, neck, jaw, teeth, or back.  You feel sweaty, dizzy, or light-headed.  You have chest pain or shortness of breath.  You vomit and your vomit looks like blood or coffee grounds.  Your stool is bloody or black. ++++++++++++++++++++++++++++++++  Food Choices for Gastroesophageal Reflux Disease, Adult When you have gastroesophageal reflux disease (GERD), the foods you eat and your eating habits are very important. Choosing the right foods can help ease the discomfort of GERD.   What general guidelines should I follow? Eating plan   Choose healthy foods low in fat, such as fruits, vegetables, whole grains, low-fat dairy products, and lean meat, fish, and poultry.  Eat frequent, small meals instead of three large meals each day. Eat your meals slowly, in a relaxed setting. Avoid bending over or lying down until 2-3 hours after eating.  Limit high-fat foods such as fatty meats or fried foods.  Limit your intake of oils, butter, and shortening to less than 8 teaspoons each day.  Avoid the following:  Foods that cause symptoms. These may be different for different people. Keep a food diary to keep track of foods that cause symptoms.  Alcohol.  Drinking large amounts of liquid with meals.  Eating meals during the 2-3 hours before bed.  Cook foods using methods other than frying. This may include baking, grilling, or broiling.   Lifestyle    Maintain a healthy weight. Ask your health care provider what weight is healthy for you. If you need to lose weight, work with your health care provider to do so safely.  Exercise for at least 30 minutes on 5 or more days each week, or as told by your health care provider.  Avoid wearing clothes that fit tightly around your waist and chest.  Do not use any products that contain nicotine or tobacco, such as  cigarettes and e-cigarettes. If you need help quitting, ask your health care provider.  Sleep with the head of your bed raised. Use a wedge under the mattress or blocks under the bed frame to raise the head of the bed. What foods are not recommended? The items listed may not be a complete list. Talk with your dietitian about what dietary choices are best for you.  Grains  Pastries or quick breads with added fat.  Vegetables  Deep fried vegetables. [place] fries. Any vegetables prepared with added fat. Any vegetables that cause symptoms. For some people this may include tomatoes and tomato products, chili peppers, onions and garlic.  Fruits  BANANAS.  For some people this may include citrus fruits, such as oranges, grapefruit, pineapple, and lemons.  Meats and other protein foods  High-fat meats, such as fatty beef or pork, hot dogs, ribs, ham, sausage, salami and bacon. Fried meat or protein, including fried fish and fried chicken. Nuts and nut butters.  Dairy  Whole milk and chocolate milk. Sour cream. Cream. Ice cream. Cream cheese. Milk shakes.  Beverages  Coffee and tea, with or without caffeine. Carbonated beverages. Sodas. Energy drinks.  Fruit juice made with acidic fruits (such as orange or grapefruit). Tomato juice. Alcoholic drinks.  Fats and oils  Butter. Margarine. Shortening.    Sweets and desserts  Chocolate and cocoa. Donuts.  Seasoning and other foods  Pepper. Peppermint and spearmint. Any condiments, herbs, or seasonings that cause symptoms. For some people, this may include curry, hot sauce, or vinegar-based salad dressings.  Summary  When you have gastroesophageal reflux disease (GERD), food and lifestyle choices are very important to help ease the discomfort of GERD.  Eat frequent, small meals instead of three large meals each day. Eat your meals slowly, in a relaxed setting. Avoid bending over or lying down until 2-3 hours after eating.  Limit high-fat foods  such as fatty meat or fried foods. This information is not intended to replace advice given to you by your health care provider. Make sure you discuss any questions you have with your health care provider. Document Released: 08/14/2005 Document Revised: 08/15/2016 Document Reviewed: 08/15/2016 Elsevier Interactive Patient Education  2017 [employer].

## 2016-12-18 ENCOUNTER — Ambulatory Visit
Admission: RE | Admit: 2016-12-18 | Discharge: 2016-12-18 | Disposition: A | Discharge status: Home / Self Care | Payer: 59 | Source: Ambulatory Visit | Attending: Internal Medicine | Admitting: Internal Medicine

## 2016-12-18 DIAGNOSIS — Z1231 Encounter for screening mammogram for malignant neoplasm of breast: Secondary | ICD-10-CM

## 2016-12-22 ENCOUNTER — Ambulatory Visit (HOSPITAL_COMMUNITY)
Admission: RE | Admit: 2016-12-22 | Discharge: 2016-12-22 | Disposition: A | Discharge status: Home / Self Care | Payer: BLUE CROSS/BLUE SHIELD | Source: Ambulatory Visit | Attending: Internal Medicine | Admitting: Internal Medicine

## 2016-12-22 DIAGNOSIS — R1011 Right upper quadrant pain: Secondary | ICD-10-CM | POA: Insufficient documentation

## 2016-12-22 MED ORDER — TECHNETIUM TC 99M MEBROFENIN IV KIT
5.0500 | PACK | Freq: Once | INTRAVENOUS | Status: AC | PRN
  Administered 2016-12-22: 5.05 via INTRAVENOUS

## 2016-12-25 ENCOUNTER — Other Ambulatory Visit: Payer: Self-pay | Admitting: Internal Medicine

## 2016-12-25 DIAGNOSIS — K811 Chronic cholecystitis: Secondary | ICD-10-CM

## 2017-01-02 LAB — HM DEXA SCAN

## 2017-01-17 ENCOUNTER — Ambulatory Visit: Payer: Self-pay | Admitting: Surgery

## 2017-01-17 NOTE — H&P (Signed)
History of Present Illness Isabel Tyler. Mann Skaggs MD; 01/17/2017 12:31 PM) The patient is a 66 year old female who presents for evaluation of gall stones. Referred by Dr. Unk Pinto for symptomatic gallbladder disease.  This is a 66 year old female who moved to [place] within the last year. She has had a 30 year history of bloating with excessive belching and flatulence. She has had an extensive workup including multiple colonoscopies and EGD. Her symptoms are often accompanied by some epigastric discomfort and mild nausea. She also reports frequent bowel movements that are occasionally diarrhea. Her symptoms seem to be exacerbated by eating fried foods. The patient has had 6 previous episodes of diverticulitis that have always occurred in her left lower quadrant. She has never required any surgery. There have been no discussions about elective sigmoid resection. Her last colonoscopy she thinks was in 2016. Reportedly, she had a CT scan in 2016 that had incidental finding of cholelithiasis with no sign of cholecystitis.  Recently the patient woke up from sleep with severe epigastric pain. She felt a tight band of pressure wrapping around her lower chest and upper abdomen with pain radiating through to her back. She was fairly distended. She had some mild nausea. She was evaluated in the emergency department and was ruled out for cardiac event. She later saw Dr. Melford Aase who ordered a HIDA scan. This showed a decreased gallbladder ejection fraction at 11%. She is now referred for surgical evaluation.  CLINICAL DATA: RIGHT upper quadrant pain and bloating for more than 5 years  EXAM: NUCLEAR MEDICINE HEPATOBILIARY IMAGING WITH GALLBLADDER EF  TECHNIQUE: Sequential images of the abdomen were obtained out to 60 minutes following intravenous administration of radiopharmaceutical. After oral ingestion of Ensure, gallbladder ejection fraction was determined. At 60 min, normal  ejection fraction is greater than 33%.  RADIOPHARMACEUTICALS: 5.05 mCi Tc-26m Choletec IV  COMPARISON: None  FINDINGS: Normal tracer extraction from bloodstream indicating normal hepatocellular function.  Normal excretion of tracer into biliary tree.  Gallbladder visualized at 19 min.  Small bowel visualized at 31 min.  No hepatic retention of tracer.  Subjectively decreased emptying of tracer from gallbladder following fatty meal stimulation.  Calculated gallbladder ejection fraction is 11%, abnormally low.  Patient reported no symptoms following Ensure ingestion.  Normal gallbladder ejection fraction following Ensure ingestion is greater than 33% at 1 hour.  IMPRESSION: Patent biliary tree.  Abnormal gallbladder response to fatty meal stimulation with a decreased gallbladder ejection fraction of 11%.   Electronically Signed By: Lavonia Dana M.D. On: 12/22/2016 13:21    Past Surgical History Dalbert Mayotte, [place]; 01/17/2017 11:46 AM) Breast Biopsy Left.  Diagnostic Studies History Dalbert Mayotte, [place]; 01/17/2017 11:46 AM) Colonoscopy 1-5 years ago Mammogram within last year  Allergies Dalbert Mayotte, CMA; 01/17/2017 11:48 AM) Cipro *Fluoroquinolones Allergies Reconciled  Medication History Dalbert Mayotte, CMA; 01/17/2017 11:52 AM) Fish Oil (500MG  Capsule, Oral) Active. flex seed Active. Mirabegron ER (50MG  Tablet ER 24HR, Oral) Active. Levothyroxine Sodium (75MCG Tablet, Oral) Active. Escitalopram Oxalate (20MG  Tablet, Oral) Active. Lexapro (20MG  Tablet, Oral) Active. Probiotic Product (Oral) Active. Vitamin D (1000UNIT Capsule, Oral) Active.  Social History Dalbert Mayotte, [place]; 01/17/2017 11:46 AM) Alcohol use Occasional alcohol use. Caffeine use Coffee, Tea. No drug use Tobacco use Never smoker.  Family History Dalbert Mayotte, [place]; 01/17/2017 11:46 AM) Alcohol Abuse Sister. Arthritis Father, Sister. Breast Cancer  Sister. Depression Sister. Heart Disease Father. Hypertension Father. Melanoma Father.  Pregnancy / Birth History Dalbert Mayotte, [place]; 01/17/2017 11:46 AM) Age at  menarche 28 years. Age of menopause 76-50 Gravida 2 Length (months) of breastfeeding 7-12 Maternal age 6-30 Para 2  Other Problems Dalbert Mayotte, [place]; 01/17/2017 11:46 AM) Anxiety Disorder Diverticulosis Hemorrhoids Thyroid Disease     Review of Systems Dalbert Mayotte CMA; 01/17/2017 11:46 AM) General Present- Fatigue. Not Present- Appetite Loss, Chills, Fever, Night Sweats, Weight Gain and Weight Loss. Skin Present- Dryness. Not Present- Change in Wart/Mole, Hives, Jaundice, New Lesions, Non-Healing Wounds, Rash and Ulcer. HEENT Present- Seasonal Allergies and Wears glasses/contact lenses. Not Present- Earache, Hearing Loss, Hoarseness, Nose Bleed, Oral Ulcers, Ringing in the Ears, Sinus Pain, Sore Throat, Visual Disturbances and Yellow Eyes. Respiratory Not Present- Bloody sputum, Chronic Cough, Difficulty Breathing, Snoring and Wheezing. Breast Not Present- Breast Mass, Breast Pain, Nipple Discharge and Skin Changes. Cardiovascular Not Present- Chest Pain, Difficulty Breathing Lying Down, Leg Cramps, Palpitations, Rapid Heart Rate, Shortness of Breath and Swelling of Extremities. Gastrointestinal Present- Excessive gas and Hemorrhoids. Not Present- Abdominal Pain, Bloating, Bloody Stool, Change in Bowel Habits, Chronic diarrhea, Constipation, Difficulty Swallowing, Gets full quickly at meals, Indigestion, Nausea, Rectal Pain and Vomiting. Female Genitourinary Present- Frequency. Not Present- Nocturia, Painful Urination, Pelvic Pain and Urgency. Musculoskeletal Not Present- Back Pain, Joint Pain, Joint Stiffness, Muscle Pain, Muscle Weakness and Swelling of Extremities. Neurological Not Present- Decreased Memory, Fainting, Headaches, Numbness, Seizures, Tingling, Tremor, Trouble walking and  Weakness. Psychiatric Not Present- Anxiety, Bipolar, Change in Sleep Pattern, Depression, Fearful and Frequent crying. Endocrine Present- Cold Intolerance. Not Present- Excessive Hunger, Hair Changes, Heat Intolerance, Hot flashes and New Diabetes. Hematology Not Present- Blood Thinners, Easy Bruising, Excessive bleeding, Gland problems, HIV and Persistent Infections.  Vitals Dalbert Mayotte CMA; 01/17/2017 11:53 AM) 01/17/2017 11:52 AM Weight: 112.2 lb Height: 60in Body Surface Area: 1.46 m Body Mass Index: 21.91 kg/m  Temp.: 97.97F  Pulse: 63 (Regular)  BP: 110/54 (Sitting, Left Arm, Standard)      Physical Exam Rodman Key K. Jadden Yim MD; 01/17/2017 12:31 PM)  The physical exam findings are as follows: Note:WDWN in NAD Eyes: Pupils equal, round; sclera anicteric HENT: Oral mucosa moist; good dentition Neck: No masses palpated, no thyromegaly Lungs: CTA bilaterally; normal respiratory effort CV: Regular rate and rhythm; no murmurs; extremities well-perfused with no edema Abd: +bowel sounds, soft, non-tender, no palpable organomegaly; no palpable hernias Skin: Warm, dry; no sign of jaundice Psychiatric - alert and oriented x 4; calm mood and affect    Assessment & Plan Rodman Key K. Lania Zawistowski MD; 01/17/2017 12:17 PM)  CHRONIC CHOLECYSTITIS WITH CALCULUS (K80.10)  Current Plans Schedule for Surgery - Laparoscopic cholecystectomy with intraoperative cholangiogram. The surgical procedure has been discussed with the patient. Potential risks, benefits, alternative treatments, and expected outcomes have been explained. All of the patient's questions at this time have been answered. The likelihood of reaching the patient's treatment goal is good. The patient understand the proposed surgical procedure and wishes to proceed. Pt Education - Pamphlet Given - Laparoscopic Gallbladder Surgery: discussed with patient and provided information.  Isabel Tyler. Georgette Dover, MD, [facility]  General/ Trauma Surgery  01/17/2017 12:32 PM

## 2017-01-26 NOTE — Patient Instructions (Addendum)
Isabel Tyler  01/26/2017   Your procedure is scheduled on: 02/08/2017   Report to [facility] Main  Entrance Take [place] Elevators to 3rd floor to  [place] at  09:00 AM.    Call this number if you have problems the morning of surgery [phone]    Remember: ONLY 1 PERSON MAY GO WITH YOU TO SHORT STAY TO GET  READY MORNING OF [place].  Do not eat food or drink liquids :After Midnight.     Take these medicines the morning of surgery with A SIP OF WATER: Lexapro, and Synthroid,                                You may not have any metal on your body including hair pins and              piercings  Do not wear jewelry, make-up, lotions, powders or perfumes, deodorant             Do not wear nail polish.  Do not shave  48 hours prior to surgery.     Do not bring valuables to the hospital. [place].  Contacts, dentures or bridgework may not be worn into surgery. .     Patients discharged the day of surgery will not be allowed to drive home.  Name and phone number of your driver:  Shanon Brow [phone]               Please read over the following fact sheets you were given: _____________________________________________________________________             [facility] - Preparing for Surgery Before surgery, you can play an important role.  Because skin is not sterile, your skin needs to be as free of germs as possible.  You can reduce the number of germs on your skin by washing with CHG (chlorahexidine gluconate) soap before surgery.  CHG is an antiseptic cleaner which kills germs and bonds with the skin to continue killing germs even after washing. Please DO NOT use if you have an allergy to CHG or antibacterial soaps.  If your skin becomes reddened/irritated stop using the CHG and inform your nurse when you arrive at Short Stay. Do not shave (including legs and underarms) for at least 48 hours prior  to the first CHG shower.  You may shave your face/neck. Please follow these instructions carefully:  1.  Shower with CHG Soap the night before surgery and the  morning of Surgery.  2.  If you choose to wash your hair, wash your hair first as usual with your  normal  shampoo.  3.  After you shampoo, rinse your hair and body thoroughly to remove the  shampoo.                           4.  Use CHG as you would any other liquid soap.  You can apply chg directly  to the skin and wash                       Gently with a scrungie or clean washcloth.  5.  Apply the CHG Soap to  your body ONLY FROM THE NECK DOWN.   Do not use on face/ open                           Wound or open sores. Avoid contact with eyes, ears mouth and genitals (private parts).                       Wash face,  Genitals (private parts) with your normal soap.             6.  Wash thoroughly, paying special attention to the area where your surgery  will be performed.  7.  Thoroughly rinse your body with warm water from the neck down.  8.  DO NOT shower/wash with your normal soap after using and rinsing off  the CHG Soap.                9.  Pat yourself dry with a clean towel.            10.  Wear clean pajamas.            11.  Place clean sheets on your bed the night of your first shower and do not  sleep with pets. Day of Surgery : Do not apply any lotions/deodorants the morning of surgery.  Please wear clean clothes to the hospital/surgery center.  FAILURE TO FOLLOW THESE INSTRUCTIONS MAY RESULT IN THE CANCELLATION OF YOUR SURGERY PATIENT SIGNATURE_________________________________  NURSE SIGNATURE__________________________________  ________________________________________________________________________

## 2017-01-26 NOTE — Progress Notes (Signed)
12/06/16-EKG-epic  11/1016-CXR-epic

## 2017-01-29 ENCOUNTER — Encounter (INDEPENDENT_AMBULATORY_CARE_PROVIDER_SITE_OTHER): Payer: Self-pay

## 2017-01-29 ENCOUNTER — Encounter (HOSPITAL_COMMUNITY): Payer: Self-pay

## 2017-01-29 ENCOUNTER — Encounter (HOSPITAL_COMMUNITY)
Admission: RE | Admit: 2017-01-29 | Discharge: 2017-01-29 | Disposition: A | Discharge status: Home / Self Care | Payer: BLUE CROSS/BLUE SHIELD | Source: Ambulatory Visit | Attending: Surgery | Admitting: Surgery

## 2017-01-29 DIAGNOSIS — Z01818 Encounter for other preprocedural examination: Secondary | ICD-10-CM | POA: Insufficient documentation

## 2017-01-29 DIAGNOSIS — K811 Chronic cholecystitis: Secondary | ICD-10-CM | POA: Diagnosis not present

## 2017-01-29 LAB — CBC
HEMATOCRIT: 39.7 % (ref 36.0–46.0)
Hemoglobin: 13.4 g/dL (ref 12.0–15.0)
MCH: 31.9 pg (ref 26.0–34.0)
MCHC: 33.8 g/dL (ref 30.0–36.0)
MCV: 94.5 fL (ref 78.0–100.0)
Platelets: 252 10*3/uL (ref 150–400)
RBC: 4.2 MIL/uL (ref 3.87–5.11)
RDW: 12.6 % (ref 11.5–15.5)
WBC: 4.4 10*3/uL (ref 4.0–10.5)

## 2017-01-31 ENCOUNTER — Encounter: Payer: Self-pay | Admitting: Internal Medicine

## 2017-02-07 NOTE — Progress Notes (Signed)
Spoke with patient by phone. Patient aware surgery time changed to 930 am arrive 730 am wl short stay, npo after midnight.

## 2017-02-08 ENCOUNTER — Encounter (HOSPITAL_COMMUNITY)
Admission: RE | Disposition: A | Discharge status: Home / Self Care | Payer: Self-pay | Source: Ambulatory Visit | Attending: Surgery

## 2017-02-08 ENCOUNTER — Ambulatory Visit (HOSPITAL_COMMUNITY)
Admission: RE | Admit: 2017-02-08 | Discharge: 2017-02-08 | Disposition: A | Discharge status: Home / Self Care | Payer: BLUE CROSS/BLUE SHIELD | Source: Ambulatory Visit | Attending: Surgery | Admitting: Surgery

## 2017-02-08 ENCOUNTER — Encounter (HOSPITAL_COMMUNITY): Payer: Self-pay | Admitting: *Deleted

## 2017-02-08 ENCOUNTER — Ambulatory Visit (HOSPITAL_COMMUNITY): Payer: BLUE CROSS/BLUE SHIELD | Admitting: Anesthesiology

## 2017-02-08 ENCOUNTER — Ambulatory Visit (HOSPITAL_COMMUNITY): Payer: BLUE CROSS/BLUE SHIELD

## 2017-02-08 DIAGNOSIS — Z7982 Long term (current) use of aspirin: Secondary | ICD-10-CM | POA: Diagnosis not present

## 2017-02-08 DIAGNOSIS — F329 Major depressive disorder, single episode, unspecified: Secondary | ICD-10-CM | POA: Insufficient documentation

## 2017-02-08 DIAGNOSIS — Z79899 Other long term (current) drug therapy: Secondary | ICD-10-CM | POA: Diagnosis not present

## 2017-02-08 DIAGNOSIS — Z8719 Personal history of other diseases of the digestive system: Secondary | ICD-10-CM | POA: Insufficient documentation

## 2017-02-08 DIAGNOSIS — K8010 Calculus of gallbladder with chronic cholecystitis without obstruction: Secondary | ICD-10-CM | POA: Insufficient documentation

## 2017-02-08 DIAGNOSIS — F419 Anxiety disorder, unspecified: Secondary | ICD-10-CM | POA: Insufficient documentation

## 2017-02-08 DIAGNOSIS — K219 Gastro-esophageal reflux disease without esophagitis: Secondary | ICD-10-CM | POA: Insufficient documentation

## 2017-02-08 DIAGNOSIS — Z8261 Family history of arthritis: Secondary | ICD-10-CM | POA: Diagnosis not present

## 2017-02-08 DIAGNOSIS — Z419 Encounter for procedure for purposes other than remedying health state, unspecified: Secondary | ICD-10-CM

## 2017-02-08 DIAGNOSIS — Z8249 Family history of ischemic heart disease and other diseases of the circulatory system: Secondary | ICD-10-CM | POA: Diagnosis not present

## 2017-02-08 DIAGNOSIS — Z881 Allergy status to other antibiotic agents status: Secondary | ICD-10-CM | POA: Diagnosis not present

## 2017-02-08 DIAGNOSIS — Z803 Family history of malignant neoplasm of breast: Secondary | ICD-10-CM | POA: Diagnosis not present

## 2017-02-08 DIAGNOSIS — K811 Chronic cholecystitis: Secondary | ICD-10-CM | POA: Diagnosis present

## 2017-02-08 DIAGNOSIS — E039 Hypothyroidism, unspecified: Secondary | ICD-10-CM | POA: Diagnosis not present

## 2017-02-08 DIAGNOSIS — Z811 Family history of alcohol abuse and dependence: Secondary | ICD-10-CM | POA: Diagnosis not present

## 2017-02-08 HISTORY — PX: CHOLECYSTECTOMY: SHX55

## 2017-02-08 SURGERY — LAPAROSCOPIC CHOLECYSTECTOMY WITH INTRAOPERATIVE CHOLANGIOGRAM
Anesthesia: General | Site: Abdomen

## 2017-02-08 MED ORDER — SUGAMMADEX SODIUM 200 MG/2ML IV SOLN
INTRAVENOUS | Status: AC
  Filled 2017-02-08: qty 2

## 2017-02-08 MED ORDER — LIDOCAINE 2% (20 MG/ML) 5 ML SYRINGE
INTRAMUSCULAR | Status: DC | PRN
  Administered 2017-02-08: 100 mg via INTRAVENOUS

## 2017-02-08 MED ORDER — BUPIVACAINE-EPINEPHRINE 0.25% -1:200000 IJ SOLN
INTRAMUSCULAR | Status: DC | PRN
  Administered 2017-02-08: 15 mL

## 2017-02-08 MED ORDER — MIDAZOLAM HCL 5 MG/5ML IJ SOLN
INTRAMUSCULAR | Status: DC | PRN
  Administered 2017-02-08: 2 mg via INTRAVENOUS

## 2017-02-08 MED ORDER — CHLORHEXIDINE GLUCONATE CLOTH 2 % EX PADS: 6.0000 | MEDICATED_PAD | Freq: Once | CUTANEOUS | Status: DC

## 2017-02-08 MED ORDER — SUGAMMADEX SODIUM 200 MG/2ML IV SOLN
INTRAVENOUS | Status: DC | PRN
  Administered 2017-02-08: 150 mg via INTRAVENOUS

## 2017-02-08 MED ORDER — ROCURONIUM BROMIDE 50 MG/5ML IV SOSY
PREFILLED_SYRINGE | INTRAVENOUS | Status: AC
  Filled 2017-02-08: qty 5

## 2017-02-08 MED ORDER — CEFAZOLIN SODIUM-DEXTROSE 2-4 GM/100ML-% IV SOLN
2.0000 g | INTRAVENOUS | Status: AC
  Administered 2017-02-08: 2 g via INTRAVENOUS
  Filled 2017-02-08: qty 100

## 2017-02-08 MED ORDER — MIDAZOLAM HCL 2 MG/2ML IJ SOLN
INTRAMUSCULAR | Status: AC
  Filled 2017-02-08: qty 2

## 2017-02-08 MED ORDER — 0.9 % SODIUM CHLORIDE (POUR BTL) OPTIME
TOPICAL | Status: DC | PRN
  Administered 2017-02-08: 1000 mL

## 2017-02-08 MED ORDER — ONDANSETRON HCL 4 MG/2ML IJ SOLN
INTRAMUSCULAR | Status: DC | PRN
  Administered 2017-02-08: 4 mg via INTRAVENOUS

## 2017-02-08 MED ORDER — FENTANYL CITRATE (PF) 100 MCG/2ML IJ SOLN
INTRAMUSCULAR | Status: DC | PRN
  Administered 2017-02-08: 50 ug via INTRAVENOUS

## 2017-02-08 MED ORDER — LACTATED RINGERS IV SOLN
INTRAVENOUS | Status: DC
  Administered 2017-02-08 (×2): via INTRAVENOUS

## 2017-02-08 MED ORDER — DEXAMETHASONE SODIUM PHOSPHATE 10 MG/ML IJ SOLN
INTRAMUSCULAR | Status: AC
Start: 2017-02-08 — End: 2017-02-08
  Filled 2017-02-08: qty 1

## 2017-02-08 MED ORDER — LIDOCAINE 2% (20 MG/ML) 5 ML SYRINGE
INTRAMUSCULAR | Status: AC
  Filled 2017-02-08: qty 5

## 2017-02-08 MED ORDER — BUPIVACAINE-EPINEPHRINE (PF) 0.25% -1:200000 IJ SOLN
INTRAMUSCULAR | Status: AC
  Filled 2017-02-08: qty 30

## 2017-02-08 MED ORDER — ONDANSETRON HCL 4 MG/2ML IJ SOLN
INTRAMUSCULAR | Status: AC
  Filled 2017-02-08: qty 2

## 2017-02-08 MED ORDER — IOPAMIDOL (ISOVUE-300) INJECTION 61%
INTRAVENOUS | Status: DC | PRN
  Administered 2017-02-08: 2 mL

## 2017-02-08 MED ORDER — PROPOFOL 10 MG/ML IV BOLUS
INTRAVENOUS | Status: AC
  Filled 2017-02-08: qty 20

## 2017-02-08 MED ORDER — HYDROCODONE-ACETAMINOPHEN 5-325 MG PO TABS: 1.0000 | ORAL_TABLET | Freq: Four times a day (QID) | ORAL | 0 refills | Status: DC | PRN

## 2017-02-08 MED ORDER — SUCCINYLCHOLINE CHLORIDE 200 MG/10ML IV SOSY
PREFILLED_SYRINGE | INTRAVENOUS | Status: AC
  Filled 2017-02-08: qty 10

## 2017-02-08 MED ORDER — FENTANYL CITRATE (PF) 100 MCG/2ML IJ SOLN
INTRAMUSCULAR | Status: AC
  Filled 2017-02-08: qty 2

## 2017-02-08 MED ORDER — FENTANYL CITRATE (PF) 100 MCG/2ML IJ SOLN: 25.0000 ug | INTRAMUSCULAR | Status: DC | PRN

## 2017-02-08 MED ORDER — ROCURONIUM BROMIDE 10 MG/ML (PF) SYRINGE
PREFILLED_SYRINGE | INTRAVENOUS | Status: DC | PRN
  Administered 2017-02-08: 30 mg via INTRAVENOUS

## 2017-02-08 MED ORDER — LACTATED RINGERS IR SOLN
Status: DC | PRN
  Administered 2017-02-08: 1000 mL

## 2017-02-08 MED ORDER — ONDANSETRON HCL 4 MG/2ML IJ SOLN: 4.0000 mg | Freq: Once | INTRAMUSCULAR | Status: DC | PRN

## 2017-02-08 MED ORDER — SUCCINYLCHOLINE CHLORIDE 200 MG/10ML IV SOSY
PREFILLED_SYRINGE | INTRAVENOUS | Status: DC | PRN
  Administered 2017-02-08: 100 mg via INTRAVENOUS

## 2017-02-08 MED ORDER — DEXAMETHASONE SODIUM PHOSPHATE 10 MG/ML IJ SOLN
INTRAMUSCULAR | Status: DC | PRN
  Administered 2017-02-08: 10 mg via INTRAVENOUS

## 2017-02-08 MED ORDER — IOPAMIDOL (ISOVUE-300) INJECTION 61%
INTRAVENOUS | Status: AC
Start: 2017-02-08 — End: 2017-02-08
  Filled 2017-02-08: qty 50

## 2017-02-08 MED ORDER — PROPOFOL 10 MG/ML IV BOLUS
INTRAVENOUS | Status: DC | PRN
  Administered 2017-02-08: 150 mg via INTRAVENOUS

## 2017-02-08 SURGICAL SUPPLY — 39 items
APPLIER CLIP ROT 10 11.4 M/L (STAPLE) ×3
BENZOIN TINCTURE PRP APPL 2/3 (GAUZE/BANDAGES/DRESSINGS) ×3 IMPLANT
CHLORAPREP W/TINT 26ML (MISCELLANEOUS) ×3 IMPLANT
CLIP APPLIE ROT 10 11.4 M/L (STAPLE) ×1 IMPLANT
CLOSURE WOUND 1/2 X4 (GAUZE/BANDAGES/DRESSINGS) ×1
COVER MAYO STAND STRL (DRAPES) ×3 IMPLANT
COVER SURGICAL LIGHT HANDLE (MISCELLANEOUS) ×3 IMPLANT
DECANTER SPIKE VIAL GLASS SM (MISCELLANEOUS) ×3 IMPLANT
DRAPE C-ARM 42X120 X-RAY (DRAPES) ×3 IMPLANT
DRAPE UTILITY XL STRL (DRAPES) ×3 IMPLANT
DRSG TEGADERM 2-3/8X2-3/4 SM (GAUZE/BANDAGES/DRESSINGS) ×9 IMPLANT
DRSG TEGADERM 4X4.75 (GAUZE/BANDAGES/DRESSINGS) ×3 IMPLANT
ELECT REM PT RETURN 15FT ADLT (MISCELLANEOUS) ×3 IMPLANT
FILTER SMOKE EVAC LAPAROSHD (FILTER) ×3 IMPLANT
GLOVE BIO SURGEON STRL SZ7 (GLOVE) ×3 IMPLANT
GLOVE BIOGEL PI IND STRL 7.5 (GLOVE) ×1 IMPLANT
GLOVE BIOGEL PI INDICATOR 7.5 (GLOVE) ×2
GOWN STRL REUS W/TWL LRG LVL3 (GOWN DISPOSABLE) ×3 IMPLANT
GOWN STRL REUS W/TWL XL LVL3 (GOWN DISPOSABLE) ×6 IMPLANT
IRRIG SUCT STRYKERFLOW 2 WTIP (MISCELLANEOUS) ×3
IRRIGATION SUCT STRKRFLW 2 WTP (MISCELLANEOUS) ×1 IMPLANT
KIT BASIN OR (CUSTOM PROCEDURE TRAY) ×3 IMPLANT
NS IRRIG 1000ML POUR BTL (IV SOLUTION) ×3 IMPLANT
POSITIONER SURGICAL ARM (MISCELLANEOUS) IMPLANT
POUCH SPECIMEN RETRIEVAL 10MM (ENDOMECHANICALS) ×3 IMPLANT
RINGERS IRRIG 1000ML POUR BTL (IV SOLUTION) ×3 IMPLANT
SCISSORS LAP 5X35 DISP (ENDOMECHANICALS) ×3 IMPLANT
SET CHOLANGIOGRAPH MIX (MISCELLANEOUS) ×3 IMPLANT
STRIP CLOSURE SKIN 1/2X4 (GAUZE/BANDAGES/DRESSINGS) ×2 IMPLANT
SUT MNCRL AB 4-0 PS2 18 (SUTURE) ×3 IMPLANT
SYR 20CC LL (SYRINGE) IMPLANT
TAPE CLOTH 4X10 WHT NS (GAUZE/BANDAGES/DRESSINGS) IMPLANT
TOWEL OR 17X26 10 PK STRL BLUE (TOWEL DISPOSABLE) ×3 IMPLANT
TOWEL OR NON WOVEN STRL DISP B (DISPOSABLE) ×3 IMPLANT
TRAY LAPAROSCOPIC (CUSTOM PROCEDURE TRAY) ×3 IMPLANT
TROCAR BLADELESS OPT 5 100 (ENDOMECHANICALS) ×6 IMPLANT
TROCAR XCEL BLUNT TIP 100MML (ENDOMECHANICALS) ×3 IMPLANT
TROCAR XCEL NON-BLD 11X100MML (ENDOMECHANICALS) ×3 IMPLANT
TUBING INSUF HEATED (TUBING) IMPLANT

## 2017-02-08 NOTE — Transfer of Care (Signed)
Immediate Anesthesia Transfer of Care Note  Patient: Isabel Tyler  Procedure(s) Performed: Procedure(s): LAPAROSCOPIC CHOLECYSTECTOMY WITH INTRAOPERATIVE CHOLANGIOGRAM (N/A)  Patient Location: PACU  Anesthesia Type:General  Level of Consciousness: sedated  Airway & Oxygen Therapy: Patient Spontanous Breathing and non-rebreather face mask  Post-op Assessment: Report given to RN and Post -op Vital signs reviewed and stable  Post vital signs: Reviewed and stable  Last Vitals:  Vitals:   02/08/17 0733  BP: 98/61  Pulse: 63  Resp: 16  Temp: 36.9 C    Last Pain:  Vitals:   02/08/17 0733  TempSrc: Oral      Patients Stated Pain Goal: 5 (32/41/99 1444)  Complications: No apparent anesthesia complications

## 2017-02-08 NOTE — Op Note (Signed)
Laparoscopic Cholecystectomy with IOC Procedure Note  Indications: This patient presents with symptomatic gallbladder disease and will undergo laparoscopic cholecystectomy.  Pre-operative Diagnosis: Chronic cholecystitis  Post-operative Diagnosis: Same  Surgeon: Akyra Bouchie K.   Assistants: none  Anesthesia: General endotracheal anesthesia  ASA Class: 2  Procedure Details  The patient was seen again in the Holding Room. The risks, benefits, complications, treatment options, and expected outcomes were discussed with the patient. The possibilities of reaction to medication, pulmonary aspiration, perforation of viscus, bleeding, recurrent infection, finding a normal gallbladder, the need for additional procedures, failure to diagnose a condition, the possible need to convert to an open procedure, and creating a complication requiring transfusion or operation were discussed with the patient. The likelihood of improving the patient's symptoms with return to their baseline status is good.  The patient and/or family concurred with the proposed plan, giving informed consent. The site of surgery properly noted. The patient was taken to Operating Room, identified as [place] and the procedure verified as Laparoscopic Cholecystectomy with Intraoperative Cholangiogram. A Time Out was held and the above information confirmed.  Prior to the induction of general anesthesia, antibiotic prophylaxis was administered. General endotracheal anesthesia was then administered and tolerated well. After the induction, the abdomen was prepped with Chloraprep and draped in the sterile fashion. The patient was positioned in the supine position.  Local anesthetic agent was injected into the skin below the umbilicus and an incision made. We dissected down to the abdominal fascia with blunt dissection.  The fascia was incised vertically and we entered the peritoneal cavity bluntly.  A pursestring suture of 0-Vicryl  was placed around the fascial opening.  The Hasson cannula was inserted and secured with the stay suture.  Pneumoperitoneum was then created with CO2 and tolerated well without any adverse changes in the patient's vital signs. An 11-mm port was placed in the subxiphoid position.  Two 5-mm ports were placed in the right upper quadrant. All skin incisions were infiltrated with a local anesthetic agent before making the incision and placing the trocars.   We positioned the patient in reverse Trendelenburg, tilted slightly to the patient's left.  The gallbladder was identified, the fundus grasped and retracted cephalad. There were minimal adhesions to the gallbladder. The infundibulum was grasped and retracted laterally, exposing the peritoneum overlying the triangle of Calot. This was then divided and exposed in a blunt fashion. A critical view of the cystic duct and cystic artery was obtained.  The cystic duct was clearly identified and bluntly dissected circumferentially. The cystic duct was ligated with a clip distally.   An incision was made in the cystic duct and the [facility] cholangiogram catheter introduced. The catheter was secured using a clip. A cholangiogram was then obtained which showed good visualization of the distal and proximal biliary tree with no sign of filling defects or obstruction.  Contrast flowed easily into the duodenum. The catheter was then removed.   The cystic duct was then ligated with clips and divided. The cystic artery was identified, dissected free, ligated with clips and divided as well.   The gallbladder was dissected from the liver bed in retrograde fashion with the electrocautery. The gallbladder was removed and placed in an Endocatch sac. The liver bed was irrigated and inspected. Hemostasis was achieved with the electrocautery. Copious irrigation was utilized and was repeatedly aspirated until clear.  The gallbladder and Endocatch sac were then removed through the umbilical  port site.  The pursestring suture was used to close  the umbilical fascia.    We again inspected the right upper quadrant for hemostasis.  Pneumoperitoneum was released as we removed the trocars.  4-0 Monocryl was used to close the skin.   Benzoin, steri-strips, and clean dressings were applied. The patient was then extubated and brought to the recovery room in stable condition. Instrument, sponge, and needle counts were correct at closure and at the conclusion of the case.   Findings: Cholecystitis without Cholelithiasis  Estimated Blood Loss: Minimal         Drains: none         Specimens: Gallbladder           Complications: None; patient tolerated the procedure well.         Disposition: PACU - hemodynamically stable.         Condition: stable   Imogene Burn. Georgette Dover, MD, [facility]  General/ Trauma Surgery  02/08/2017 10:27 AM

## 2017-02-08 NOTE — Discharge Instructions (Signed)
General Anesthesia, Adult, Care After These instructions provide you with information about caring for yourself after your procedure. Your health care provider may also give you more specific instructions. Your treatment has been planned according to current medical practices, but problems sometimes occur. Call your health care provider if you have any problems or questions after your procedure. What can I expect after the procedure? After the procedure, it is common to have:  Vomiting.  A sore throat.  Mental slowness.  It is common to feel:  Nauseous.  Cold or shivery.  Sleepy.  Tired.  Sore or achy, even in parts of your body where you did not have surgery.  Follow these instructions at home: For at least 24 hours after the procedure:  Do not: ? Participate in activities where you could fall or become injured. ? Drive. ? Use heavy machinery. ? Drink alcohol. ? Take sleeping pills or medicines that cause drowsiness. ? Make important decisions or sign legal documents. ? Take care of children on your own.  Rest. Eating and drinking  If you vomit, drink water, juice, or soup when you can drink without vomiting.  Drink enough fluid to keep your urine clear or pale yellow.  Make sure you have little or no nausea before eating solid foods.  Follow the diet recommended by your health care provider. General instructions  Have a responsible adult stay with you until you are awake and alert.  Return to your normal activities as told by your health care provider. Ask your health care provider what activities are safe for you.  Take over-the-counter and prescription medicines only as told by your health care provider.  If you smoke, do not smoke without supervision.  Keep all follow-up visits as told by your health care provider. This is important. Contact a health care provider if:  You continue to have nausea or vomiting at home, and medicines are not helpful.  You  cannot drink fluids or start eating again.  You cannot urinate after 8-12 hours.  You develop a skin rash.  You have fever.  You have increasing redness at the site of your procedure. Get help right away if:  You have difficulty breathing.  You have chest pain.  You have unexpected bleeding.  You feel that you are having a life-threatening or urgent problem. This information is not intended to replace advice given to you by your health care provider. Make sure you discuss any questions you have with your health care provider. Document Released: 11/20/2000 Document Revised: 01/17/2016 Document Reviewed: 07/29/2015 Elsevier Interactive Patient Education  2018 [employer] LAPAROSCOPIC SURGERY: POST OP INSTRUCTIONS Always review your discharge instruction sheet given to you by the facility where your surgery was performed. IF YOU HAVE DISABILITY OR FAMILY LEAVE FORMS, YOU MUST BRING THEM TO THE OFFICE FOR PROCESSING.   DO NOT GIVE THEM TO YOUR DOCTOR.  1. A prescription for pain medication will be given to you upon discharge.  Take your pain medication as prescribed, if needed.  If narcotic pain medicine is not needed, then you may take acetaminophen (Tylenol) or ibuprofen (Advil) as needed. 2. Take your usually prescribed medications unless otherwise directed. 3. If you need a refill on your pain medication, please contact your pharmacy.  They will contact our office to request authorization. Prescriptions will not be filled after 5pm or on week-ends. 4. You should follow a light diet the first few days after arrival home, such  as soup and crackers, etc.  Be sure to include lots of fluids daily. 5. Most patients will experience some swelling and bruising in the area of the incisions.  Ice packs will help.  Swelling and bruising can take several days to resolve.  6. It is common to experience some constipation if taking pain medication after surgery.   Increasing fluid intake and taking a stool softener (such as Colace) will usually help or prevent this problem from occurring.  A mild laxative (Milk of Magnesia or Miralax) should be taken according to package instructions if there are no bowel movements after 48 hours. 7. Unless discharge instructions indicate otherwise, you may remove your bandages 48 hours after surgery, and you may shower at that time.  You will have steri-strips (small skin tapes) in place directly over the incision.  These strips should be left on the skin for 7-10 days.  If your surgeon used skin glue on the incision, you may shower in 24 hours.  The glue will flake off over the next 2-3 weeks.  Any sutures or staples will be removed at the office during your follow-up visit. 8. ACTIVITIES:  You may resume regular (light) daily activities beginning the next day--such as daily self-care, walking, climbing stairs--gradually increasing activities as tolerated.  You may have sexual intercourse when it is comfortable.  Refrain from any heavy lifting or straining until approved by your doctor. a. You may drive when you are no longer taking prescription pain medication, you can comfortably wear a seatbelt, and you can safely maneuver your car and apply brakes. b. RETURN TO WORK:   2-3 weeks 9. You should see your doctor in the office for a follow-up appointment approximately 2-3 weeks after your surgery.  Make sure that you call for this appointment within a day or two after you arrive home to insure a convenient appointment time. 10. OTHER INSTRUCTIONS: ________________________________________________________________________ WHEN TO CALL YOUR DOCTOR: 1. Fever over 101.0 2. Inability to urinate 3. Continued bleeding from incision. 4. Increased pain, redness, or drainage from the incision. 5. Increasing abdominal pain  The clinic staff is available to answer your questions during regular business hours.  Please dont hesitate to call  and ask to speak to one of the nurses for clinical concerns.  If you have a medical emergency, go to the nearest emergency room or call 911.  A surgeon from [facility] is always on call at the hospital. [address] [place], [place], [place]  [address] ? [address], [place], [place]   [address] [phone] ? [phone] ? FAX [phone] Web site: [email or web address]

## 2017-02-08 NOTE — H&P (View-Only) (Signed)
History of Present Illness [place]. Isabel Sorn MD; 01/17/2017 12:31 PM) The patient is a 66 year old female who presents for evaluation of gall stones. Referred by Dr. Unk Tyler for symptomatic gallbladder disease.  This is a 66 year old female who moved to [place] within the last year. She has had a 30 year history of bloating with excessive belching and flatulence. She has had an extensive workup including multiple colonoscopies and EGD. Her symptoms are often accompanied by some epigastric discomfort and mild nausea. She also reports frequent bowel movements that are occasionally diarrhea. Her symptoms seem to be exacerbated by eating fried foods. The patient has had 6 previous episodes of diverticulitis that have always occurred in her left lower quadrant. She has never required any surgery. There have been no discussions about elective sigmoid resection. Her last colonoscopy she thinks was in 2016. Reportedly, she had a CT scan in 2016 that had incidental finding of cholelithiasis with no sign of cholecystitis.  Recently the patient woke up from sleep with severe epigastric pain. She felt a tight band of pressure wrapping around her lower chest and upper abdomen with pain radiating through to her back. She was fairly distended. She had some mild nausea. She was evaluated in the emergency department and was ruled out for cardiac event. She later saw Dr. Melford Tyler who ordered a HIDA scan. This showed a decreased gallbladder ejection fraction at 11%. She is now referred for surgical evaluation.  CLINICAL DATA: RIGHT upper quadrant pain and bloating for more than 5 years  EXAM: NUCLEAR MEDICINE HEPATOBILIARY IMAGING WITH GALLBLADDER EF  TECHNIQUE: Sequential images of the abdomen were obtained out to 60 minutes following intravenous administration of radiopharmaceutical. After oral ingestion of Ensure, gallbladder ejection fraction was determined. At 60 min, normal  ejection fraction is greater than 33%.  RADIOPHARMACEUTICALS: 5.05 mCi Tc-66m Choletec IV  COMPARISON: None  FINDINGS: Normal tracer extraction from bloodstream indicating normal hepatocellular function.  Normal excretion of tracer into biliary tree.  Gallbladder visualized at 19 min.  Small bowel visualized at 31 min.  No hepatic retention of tracer.  Subjectively decreased emptying of tracer from gallbladder following fatty meal stimulation.  Calculated gallbladder ejection fraction is 11%, abnormally low.  Patient reported no symptoms following Ensure ingestion.  Normal gallbladder ejection fraction following Ensure ingestion is greater than 33% at 1 hour.  IMPRESSION: Patent biliary tree.  Abnormal gallbladder response to fatty meal stimulation with a decreased gallbladder ejection fraction of 11%.   Electronically Signed By: [place] M.D. On: 12/22/2016 13:21    Past Surgical History [place], [place]; 01/17/2017 11:46 AM) Breast Biopsy Left.  Diagnostic Studies History [place], Isabel [place]; 01/17/2017 11:46 AM) Colonoscopy 1-5 years ago Mammogram within last year  Allergies [place], CMA; 01/17/2017 11:48 AM) Cipro *Fluoroquinolones Allergies Reconciled  Medication History [place], CMA; 01/17/2017 11:52 AM) Fish Oil (500MG  Capsule, Oral) Active. flex seed Active. Mirabegron ER (50MG  Tablet ER 24HR, Oral) Active. Levothyroxine Sodium (75MCG Tablet, Oral) Active. Escitalopram Oxalate (20MG  Tablet, Oral) Active. Lexapro (20MG  Tablet, Oral) Active. Probiotic Product (Oral) Active. Vitamin D (1000UNIT Capsule, Oral) Active.  Social History [place], [place]; 01/17/2017 11:46 AM) Alcohol use Occasional alcohol use. Caffeine use Coffee, Tea. No drug use Tobacco use Never smoker.  Family History [place], [place]; 01/17/2017 11:46 AM) Alcohol Abuse Sister. Arthritis Father, Sister. Breast Cancer  Sister. Depression Sister. Heart Disease Father. Hypertension Father. Melanoma Father.  Pregnancy / Birth History [place], Isabel [place]; 01/17/2017 11:46 AM) Age at  menarche 47 years. Age of menopause 79-50 Gravida 2 Length (months) of breastfeeding 7-12 Maternal age 89-30 Para 2  Other Problems [place], Isabel [place]; 01/17/2017 11:46 AM) Anxiety Disorder Diverticulosis Hemorrhoids Thyroid Disease     Review of Systems [place] CMA; 01/17/2017 11:46 AM) General Present- Fatigue. Not Present- Appetite Loss, Chills, Fever, Night Sweats, Weight Gain and Weight Loss. Skin Present- Dryness. Not Present- Change in Wart/Mole, Hives, Jaundice, Isabel Lesions, Non-Healing Wounds, Rash and Ulcer. HEENT Present- Seasonal Allergies and Wears glasses/contact lenses. Not Present- Earache, Hearing Loss, Hoarseness, Nose Bleed, Oral Ulcers, Ringing in the Ears, Sinus Pain, Sore Throat, Visual Disturbances and Yellow Eyes. Respiratory Not Present- Bloody sputum, Chronic Cough, Difficulty Breathing, Snoring and Wheezing. Breast Not Present- Breast Mass, Breast Pain, Nipple Discharge and Skin Changes. Cardiovascular Not Present- Chest Pain, Difficulty Breathing Lying Down, Leg Cramps, Palpitations, Rapid Heart Rate, Shortness of Breath and Swelling of Extremities. Gastrointestinal Present- Excessive gas and Hemorrhoids. Not Present- Abdominal Pain, Bloating, Bloody Stool, Change in Bowel Habits, Chronic diarrhea, Constipation, Difficulty Swallowing, Gets full quickly at meals, Indigestion, Nausea, Rectal Pain and Vomiting. Female Genitourinary Present- Frequency. Not Present- Nocturia, Painful Urination, Pelvic Pain and Urgency. Musculoskeletal Not Present- Back Pain, Joint Pain, Joint Stiffness, Muscle Pain, Muscle Weakness and Swelling of Extremities. Neurological Not Present- Decreased Memory, Fainting, Headaches, Numbness, Seizures, Tingling, Tremor, Trouble walking and  Weakness. Psychiatric Not Present- Anxiety, Bipolar, Change in Sleep Pattern, Depression, Fearful and Frequent crying. Endocrine Present- Cold Intolerance. Not Present- Excessive Hunger, Hair Changes, Heat Intolerance, Hot flashes and Isabel Diabetes. Hematology Not Present- Blood Thinners, Easy Bruising, Excessive bleeding, Gland problems, HIV and Persistent Infections.  Vitals [place] CMA; 01/17/2017 11:53 AM) 01/17/2017 11:52 AM Weight: 112.2 lb Height: 60in Body Surface Area: 1.46 m Body Mass Index: 21.91 kg/m  Temp.: 97.3F  Pulse: 63 (Regular)  BP: 110/54 (Sitting, Left Arm, Standard)      Physical Exam Rodman Key K. Jamee Keach MD; 01/17/2017 12:31 PM)  The physical exam findings are as follows: Note:WDWN in NAD Eyes: Pupils equal, round; sclera anicteric HENT: Oral mucosa moist; good dentition Neck: No masses palpated, no thyromegaly Lungs: CTA bilaterally; normal respiratory effort CV: Regular rate and rhythm; no murmurs; extremities well-perfused with no edema Abd: +bowel sounds, soft, non-tender, no palpable organomegaly; no palpable hernias Skin: Warm, dry; no sign of jaundice Psychiatric - alert and oriented x 4; calm mood and affect    Assessment & Plan Rodman Key K. Jirah Rider MD; 01/17/2017 12:17 PM)  CHRONIC CHOLECYSTITIS WITH CALCULUS (K80.10)  Current Plans Schedule for Surgery - Laparoscopic cholecystectomy with intraoperative cholangiogram. The surgical procedure has been discussed with the patient. Potential risks, benefits, alternative treatments, and expected outcomes have been explained. All of the patient's questions at this time have been answered. The likelihood of reaching the patient's treatment goal is good. The patient understand the proposed surgical procedure and wishes to proceed. Pt Education - Pamphlet Given - Laparoscopic Gallbladder Surgery: discussed with patient and provided information.  [place]. Georgette Dover, MD, [facility]  General/ Trauma Surgery  01/17/2017 12:32 PM

## 2017-02-08 NOTE — Interval H&P Note (Signed)
History and Physical Interval Note:  02/08/2017 8:27 AM  [place]  has presented today for surgery, with the diagnosis of Chronic calculus cholecystitis  The various methods of treatment have been discussed with the patient and family. After consideration of risks, benefits and other options for treatment, the patient has consented to  Procedure(s): LAPAROSCOPIC CHOLECYSTECTOMY WITH INTRAOPERATIVE CHOLANGIOGRAM (N/A) as a surgical intervention .  The patient's history has been reviewed, patient examined, no change in status, stable for surgery.  I have reviewed the patient's chart and labs.  Questions were answered to the patient's satisfaction.     Brunilda Eble K.

## 2017-02-08 NOTE — Anesthesia Procedure Notes (Signed)
Procedure Name: Intubation Date/Time: 02/08/2017 9:25 AM Performed by: Lind Covert Pre-anesthesia Checklist: Patient identified, Emergency Drugs available, Suction available, Patient being monitored and Timeout performed Patient Re-evaluated:Patient Re-evaluated prior to inductionOxygen Delivery Method: Circle system utilized Preoxygenation: Pre-oxygenation with 100% oxygen Intubation Type: IV induction Laryngoscope Size: Mac and 3 Grade View: Grade II Number of attempts: 2 Airway Equipment and Method: Stylet Placement Confirmation: ETT inserted through vocal cords under direct vision,  positive ETCO2 and breath sounds checked- equal and bilateral Secured at: 21 cm Tube secured with: Tape Dental Injury: Teeth and Oropharynx as per pre-operative assessment

## 2017-02-08 NOTE — Anesthesia Postprocedure Evaluation (Signed)
Anesthesia Post Note  Patient: Isabel [place]  Procedure(s) Performed: Procedure(s) (LRB): LAPAROSCOPIC CHOLECYSTECTOMY WITH INTRAOPERATIVE CHOLANGIOGRAM (N/A)     Patient location during evaluation: PACU Anesthesia Type: General Level of consciousness: awake and alert Pain management: pain level controlled Vital Signs Assessment: post-procedure vital signs reviewed and stable Respiratory status: spontaneous breathing, nonlabored ventilation, respiratory function stable and patient connected to nasal cannula oxygen Cardiovascular status: blood pressure returned to baseline and stable Postop Assessment: no signs of nausea or vomiting Anesthetic complications: no    Last Vitals:  Vitals:   02/08/17 1240 02/08/17 1349  BP: 112/63 (!) 109/50  Pulse: (!) 54 (!) 57  Resp: 16 18  Temp:  36.5 C    Last Pain:  Vitals:   02/08/17 1349  TempSrc: Oral  PainSc:                  Catalina Gravel

## 2017-02-08 NOTE — Anesthesia Preprocedure Evaluation (Addendum)
Anesthesia Evaluation  Patient identified by MRN, date of birth, ID band Patient awake    Reviewed: Allergy & Precautions, NPO status , Patient's Chart, lab work & pertinent test results  Airway Mallampati: II  TM Distance: >3 FB Neck ROM: Full    Dental  (+) Teeth Intact, Dental Advisory Given   Pulmonary neg pulmonary ROS,    Pulmonary exam normal breath sounds clear to auscultation       Cardiovascular Exercise Tolerance: Good negative cardio ROS Normal cardiovascular exam Rhythm:Regular Rate:Normal     Neuro/Psych PSYCHIATRIC DISORDERS Anxiety Depression negative neurological ROS     GI/Hepatic Neg liver ROS, GERD  Medicated,  Endo/Other  Hypothyroidism   Renal/GU negative Renal ROS     Musculoskeletal negative musculoskeletal ROS (+)   Abdominal   Peds  Hematology negative hematology ROS (+)   Anesthesia Other Findings Day of surgery medications reviewed with the patient.  Reproductive/Obstetrics                             Anesthesia Physical Anesthesia Plan  ASA: II  Anesthesia Plan: General   Post-op Pain Management:    Induction: Intravenous  PONV Risk Score and Plan: 4 or greater and Ondansetron, Dexamethasone, Propofol, Midazolam and Scopolamine patch - Pre-op  Airway Management Planned: Oral ETT  Additional Equipment:   Intra-op Plan:   Post-operative Plan: Extubation in OR  Informed Consent: I have reviewed the patients History and Physical, chart, labs and discussed the procedure including the risks, benefits and alternatives for the proposed anesthesia with the patient or authorized representative who has indicated his/her understanding and acceptance.   Dental advisory given  Plan Discussed with: CRNA  Anesthesia Plan Comments: (Risks/benefits of general anesthesia discussed with patient including risk of damage to teeth, lips, gum, and tongue,  nausea/vomiting, allergic reactions to medications, and the possibility of heart attack, stroke and death.  All patient questions answered.  Patient wishes to proceed.)        Anesthesia Quick Evaluation

## 2017-02-09 ENCOUNTER — Encounter (HOSPITAL_COMMUNITY): Payer: Self-pay | Admitting: Surgery

## 2017-03-04 ENCOUNTER — Other Ambulatory Visit: Payer: Self-pay | Admitting: Internal Medicine

## 2017-04-03 ENCOUNTER — Encounter: Payer: Self-pay | Admitting: Internal Medicine

## 2017-04-04 DIAGNOSIS — K219 Gastro-esophageal reflux disease without esophagitis: Secondary | ICD-10-CM | POA: Insufficient documentation

## 2017-04-04 DIAGNOSIS — Z8719 Personal history of other diseases of the digestive system: Secondary | ICD-10-CM

## 2017-04-04 HISTORY — DX: Personal history of other diseases of the digestive system: Z87.19

## 2017-04-04 NOTE — Progress Notes (Signed)
[facility] Unk Pinto, M.D.      Uvaldo Bristle. Silverio Lay, P.A.-C [facility]                [address] [place], [place] [address] Telephone [phone] Telefax [phone]  Annual Screening/Preventative Visit & Comprehensive Evaluation &  Examination     This very nice 66 y.o. MWF presents for a Screening/Preventative Visit & comprehensive evaluation and management of multiple medical co-morbidities.  Patient has been followed for HTN, Prediabetes, Hyperlipidemia and Vitamin D Deficiency. Patient has hx/o dysthymia & has been well on Lexapro for about 15 years. More recently having c/o difficulty falling & staying asleep.  Other problems include recurrent UTI's and had a negative Cystoscopy by Dr Allen Kell      Patient is screened expectantly for labile HTN  And has prior hx/o of e negative Nuclear stress tests x 2 - the last in 2015. Patient's BP has been controlled at home and patient denies any cardiac symptoms as chest pain, palpitations, shortness of breath, dizziness or ankle swelling. Today's BP is at goal -114/78.      Patient's hyperlipidemia is controlled with diet and medications. Patient denies myalgias or other medication SE's. Last lipids were not at goal:  Lab Results  Component Value Date   CHOL 222 (H) 10/23/2016   HDL 77 10/23/2016   LDLCALC 133 (H) 10/23/2016   TRIG 59 10/23/2016   CHOLHDL 2.9 10/23/2016      Patient has is screened expectantly for Diabetes  and patient denies reactive hypoglycemic symptoms, visual blurring, diabetic polys, or paresthesias. Last A1c was at goal: Lab Results  Component Value Date   HGBA1C 5.1 07/10/2016      Patient has been on Thyroid Replacement since 2000. Finally, patient has history of Vitamin D Deficiency and last Vitamin D was very low: Lab Results  Component Value Date   VD25OH 25 (L) 07/10/2016   Current Outpatient Prescriptions on  File Prior to Visit  Medication Sig  . VITAMIN C 1000 MG Take 1,000 mg by mouth daily.  Marland Kitchen aspirin EC 81 MG  Take 81 mg by mouth daily.  Marland Kitchen VITAMIN D 5000 units Take 1 tablet by mouth daily.  Marland Kitchen escitalopram  20 MG  Take 20 mg by mouth daily.  Marland Kitchen FLAXSEED OIL 1200 MG Take 1 capsule by mouth daily.  Marland Kitchen FLONASE nasal spray 1 spray into  nostrils daily  . levothyroxine 75 MCG  TAKE 1 TAB DAILY BEFORE BREAKFAST.  Mable Fill  Place 1 drop into both eyes daily.  . Omega-3 FISH OIL 1200 MG  Take 1 capsule by mouth daily.  Marland Kitchen PROBIOTIC-10  Take 1 capsule by mouth daily.  . ONE-A-DAY BONE STRENGTH  Take 1 tablet by mouth daily.   Allergies  Allergen Reactions  . Ciprofloxacin Other (See Comments)    Alleged tendonitis   Past Medical History:  Diagnosis Date  . Anxiety   . Diverticular disease   . Hyperlipidemia, mixed   . Hypothyroidism 2000  . Vitamin D deficiency    Health Maintenance  Topic Date Due  . Hepatitis C Screening  1951/02/14  . HIV Screening  06/03/1968  . TETANUS/TDAP  06/03/1972  . PAP SMEAR  06/03/1974  . COLONOSCOPY  06/04/2003  . INFLUENZA VACCINE  03/28/2017  . MAMMOGRAM  12/19/2018   TDap in 2014  Past Surgical  History:  Procedure Laterality Date  . BREAST BIOPSY Left    benign  . CHOLECYSTECTOMY N/A 02/08/2017   Procedure: LAPAROSCOPIC CHOLECYSTECTOMY WITH INTRAOPERATIVE CHOLANGIOGRAM;  Surgeon: Donnie Mesa, MD;  Location: [facility];  Service: General;  Laterality: N/A;  . EYE SURGERY     Eyelid lifted  . TONSILLECTOMY     Family History  Problem Relation Age of Onset  . Heart disease Mother   . Heart disease Father   . Breast cancer Sister    Social History  Substance Use Topics  . Smoking status: Never Smoker  . Smokeless tobacco: Never Used  . Alcohol use Yes     Comment: occasional     ROS Constitutional: Denies fever, chills, weight loss/gain, headaches, insomnia,  night sweats, and change in appetite. Does c/o fatigue. Eyes:  Denies redness, blurred vision, diplopia, discharge, itchy, watery eyes.  ENT: Denies discharge, congestion, post nasal drip, epistaxis, sore throat, earache, hearing loss, dental pain, Tinnitus, Vertigo, Sinus pain, snoring.  Cardio: Denies chest pain, palpitations, irregular heartbeat, syncope, dyspnea, diaphoresis, orthopnea, PND, claudication, edema Respiratory: denies cough, dyspnea, DOE, pleurisy, hoarseness, laryngitis, wheezing.  Gastrointestinal: Denies dysphagia, heartburn, reflux, water brash, pain, cramps, nausea, vomiting, bloating, diarrhea, constipation, hematemesis, melena, hematochezia, jaundice, hemorrhoids Genitourinary: Denies dysuria, frequency, urgency, nocturia, hesitancy, discharge, hematuria, flank pain Breast: Breast lumps, nipple discharge, bleeding.  Musculoskeletal: Denies arthralgia, myalgia, stiffness, Jt. Swelling, pain, limp, and strain/sprain. Denies falls. Skin: Denies puritis, rash, hives, warts, acne, eczema, changing in skin lesion Neuro: No weakness, tremor, incoordination, spasms, paresthesia, pain Psychiatric: Denies confusion, memory loss, sensory loss. Denies Depression. Endocrine: Denies change in weight, skin, hair change, nocturia, and paresthesia, diabetic polys, visual blurring, hyper / hypo glycemic episodes.  Heme/Lymph: No excessive bleeding, bruising, enlarged lymph nodes.  Physical Exam  BP 114/78   Pulse 68   Temp (!) 97.3 F (36.3 C)   Resp 16   Ht 5' 0.75" (1.543 m)   Wt 113 lb 12.8 oz (51.6 kg)   BMI 21.68 kg/m   General Appearance: Well nourished, well groomed and in no apparent distress.  Eyes: PERRLA, EOMs, conjunctiva no swelling or erythema, normal fundi and vessels. Sinuses: No frontal/maxillary tenderness ENT/Mouth: EACs patent / TMs  nl. Nares clear without erythema, swelling, mucoid exudates. Oral hygiene is good. No erythema, swelling, or exudate. Tongue normal, non-obstructing. Tonsils not swollen or erythematous.  Hearing normal.  Neck: Supple, thyroid normal. No bruits, nodes or JVD. Respiratory: Respiratory effort normal.  BS equal and clear bilateral without rales, rhonci, wheezing or stridor. Cardio: Heart sounds are normal with regular rate and rhythm and no murmurs, rubs or gallops. Peripheral pulses are normal and equal bilaterally without edema. No aortic or femoral bruits. Chest: symmetric with normal excursions and percussion. Breasts: Deferred to GYN Abdomen: Flat, soft with bowel sounds active. Nontender, no guarding, rebound, hernias, masses, or organomegaly.  Lymphatics: Non tender without lymphadenopathy.  Musculoskeletal: Full ROM all peripheral extremities, joint stability, 5/5 strength, and normal gait. Skin: Warm and dry without rashes, lesions, cyanosis, clubbing or  ecchymosis.  Neuro: Cranial nerves intact, reflexes equal bilaterally. Normal muscle tone, no cerebellar symptoms. Sensation intact.  Pysch: Alert and oriented X 3, normal affect, Insight and Judgment appropriate.   Assessment and Plan  1. Annual Preventative Screening Examination  2. Elevated BP without diagnosis of hypertension  - EKG 12-Lead - Urinalysis, Routine w reflex microscopic - Microalbumin / creatinine urine ratio - CBC with Differential/Platelet - BASIC METABOLIC PANEL WITH GFR -  Magnesium - TSH  3. Hyperlipidemia, mixed  - EKG 12-Lead - Hepatic function panel - Lipid panel - TSH  4. Diabetes mellitus screening  - Hemoglobin A1c - Insulin, random  5. Vitamin D deficiency  - VITAMIN D 25 Hydroxy   6. Hypothyroidism  - TSH  7. Recurrent UTI  - Urinalysis, Routine w reflex microscopic  8. Screening for ischemic heart disease  - EKG 12-Lead  9. Gastroesophageal reflux disease   10. Hx of diverticulitis of colon   11. Medication management  - Urinalysis, Routine w reflex microscopic - Microalbumin / creatinine urine ratio - CBC with Differential/Platelet - BASIC  METABOLIC PANEL WITH GFR - Hepatic function panel - Magnesium - Lipid panel - TSH - Hemoglobin A1c - Insulin, random - VITAMIN D 25 Hydroxy  12. Screening for colorectal cancer  - POC Hemoccult Bld/Stl  13. Primary insomnia  - traZODone (DESYREL) 150 MG tablet; Take 1/2 to 1 tablet 1 hour before sleep  Dispense: 30 tablet; Refill: 0  14. Metatarsalgia of left foot  - DG Foot Complete Left;   15. Screening examination for pulmonary tuberculosis  - PPD      Patient was counseled in prudent diet to achieve/maintain BMI less than 25 for weight control, BP monitoring, regular exercise and medications. Discussed med's effects and SE's. Screening labs and tests as requested with regular follow-up as recommended. Over 40 minutes of exam, counseling, chart review and high complex critical decision making was performed.

## 2017-04-04 NOTE — Patient Instructions (Addendum)

## 2017-04-05 ENCOUNTER — Encounter: Payer: Self-pay | Admitting: Internal Medicine

## 2017-04-05 ENCOUNTER — Other Ambulatory Visit: Payer: Self-pay | Admitting: Internal Medicine

## 2017-04-05 ENCOUNTER — Ambulatory Visit (INDEPENDENT_AMBULATORY_CARE_PROVIDER_SITE_OTHER): Payer: 59 | Admitting: Internal Medicine

## 2017-04-05 ENCOUNTER — Ambulatory Visit (HOSPITAL_COMMUNITY)
Admission: RE | Admit: 2017-04-05 | Discharge: 2017-04-05 | Disposition: A | Discharge status: Home / Self Care | Payer: BLUE CROSS/BLUE SHIELD | Source: Ambulatory Visit | Attending: Internal Medicine | Admitting: Internal Medicine

## 2017-04-05 VITALS — BP 114/78 | HR 68 | Temp 97.3°F | Resp 16 | Ht 60.75 in | Wt 113.8 lb

## 2017-04-05 DIAGNOSIS — Z0001 Encounter for general adult medical examination with abnormal findings: Secondary | ICD-10-CM

## 2017-04-05 DIAGNOSIS — F5101 Primary insomnia: Secondary | ICD-10-CM

## 2017-04-05 DIAGNOSIS — Z131 Encounter for screening for diabetes mellitus: Secondary | ICD-10-CM

## 2017-04-05 DIAGNOSIS — N390 Urinary tract infection, site not specified: Secondary | ICD-10-CM

## 2017-04-05 DIAGNOSIS — Z8719 Personal history of other diseases of the digestive system: Secondary | ICD-10-CM

## 2017-04-05 DIAGNOSIS — E782 Mixed hyperlipidemia: Secondary | ICD-10-CM

## 2017-04-05 DIAGNOSIS — M810 Age-related osteoporosis without current pathological fracture: Secondary | ICD-10-CM

## 2017-04-05 DIAGNOSIS — E039 Hypothyroidism, unspecified: Secondary | ICD-10-CM

## 2017-04-05 DIAGNOSIS — Z79899 Other long term (current) drug therapy: Secondary | ICD-10-CM

## 2017-04-05 DIAGNOSIS — M7742 Metatarsalgia, left foot: Secondary | ICD-10-CM

## 2017-04-05 DIAGNOSIS — Z111 Encounter for screening for respiratory tuberculosis: Secondary | ICD-10-CM

## 2017-04-05 DIAGNOSIS — Z1211 Encounter for screening for malignant neoplasm of colon: Secondary | ICD-10-CM

## 2017-04-05 DIAGNOSIS — K219 Gastro-esophageal reflux disease without esophagitis: Secondary | ICD-10-CM

## 2017-04-05 DIAGNOSIS — R030 Elevated blood-pressure reading, without diagnosis of hypertension: Secondary | ICD-10-CM | POA: Diagnosis not present

## 2017-04-05 DIAGNOSIS — Z0000 Encounter for general adult medical examination without abnormal findings: Secondary | ICD-10-CM | POA: Diagnosis not present

## 2017-04-05 DIAGNOSIS — Z136 Encounter for screening for cardiovascular disorders: Secondary | ICD-10-CM

## 2017-04-05 DIAGNOSIS — Z1212 Encounter for screening for malignant neoplasm of rectum: Secondary | ICD-10-CM

## 2017-04-05 DIAGNOSIS — E559 Vitamin D deficiency, unspecified: Secondary | ICD-10-CM

## 2017-04-05 LAB — HEPATIC FUNCTION PANEL
ALBUMIN: 4.4 g/dL (ref 3.6–5.1)
ALT: 23 U/L (ref 6–29)
AST: 25 U/L (ref 10–35)
Alkaline Phosphatase: 74 U/L (ref 33–130)
BILIRUBIN TOTAL: 1 mg/dL (ref 0.2–1.2)
Bilirubin, Direct: 0.2 mg/dL (ref <=0.2)
Indirect Bilirubin: 0.8 mg/dL (ref 0.2–1.2)
Total Protein: 7.1 g/dL (ref 6.1–8.1)

## 2017-04-05 LAB — CBC WITH DIFFERENTIAL/PLATELET
BASOS ABS: 49 {cells}/uL (ref 0–200)
Basophils Relative: 1 %
EOS ABS: 98 {cells}/uL (ref 15–500)
Eosinophils Relative: 2 %
HCT: 42.5 % (ref 35.0–45.0)
Hemoglobin: 14.3 g/dL (ref 11.7–15.5)
Lymphocytes Relative: 36 %
Lymphs Abs: 1764 cells/uL (ref 850–3900)
MCH: 32.4 pg (ref 27.0–33.0)
MCHC: 33.6 g/dL (ref 32.0–36.0)
MCV: 96.4 fL (ref 80.0–100.0)
MONOS PCT: 8 %
MPV: 9.5 fL (ref 7.5–12.5)
Monocytes Absolute: 392 cells/uL (ref 200–950)
NEUTROS ABS: 2597 {cells}/uL (ref 1500–7800)
Neutrophils Relative %: 53 %
PLATELETS: 300 10*3/uL (ref 140–400)
RBC: 4.41 MIL/uL (ref 3.80–5.10)
RDW: 13.3 % (ref 11.0–15.0)
WBC: 4.9 10*3/uL (ref 3.8–10.8)

## 2017-04-05 LAB — LIPID PANEL
Cholesterol: 261 mg/dL — ABNORMAL HIGH (ref <200)
HDL: 87 mg/dL (ref >50)
LDL Cholesterol: 161 mg/dL — ABNORMAL HIGH (ref <100)
Total CHOL/HDL Ratio: 3 Ratio (ref <5.0)
Triglycerides: 66 mg/dL (ref <150)
VLDL: 13 mg/dL (ref <30)

## 2017-04-05 LAB — TSH: TSH: 8.77 m[IU]/L — AB

## 2017-04-05 LAB — BASIC METABOLIC PANEL WITH GFR
BUN: 17 mg/dL (ref 7–25)
CHLORIDE: 101 mmol/L (ref 98–110)
CO2: 25 mmol/L (ref 20–32)
CREATININE: 0.74 mg/dL (ref 0.50–0.99)
Calcium: 9.8 mg/dL (ref 8.6–10.4)
GFR, Est African American: >89 mL/min (ref >=60)
GFR, Est Non African American: 86 mL/min (ref >=60)
GLUCOSE: 92 mg/dL (ref 65–99)
Potassium: 5.4 mmol/L — ABNORMAL HIGH (ref 3.5–5.3)
Sodium: 136 mmol/L (ref 135–146)

## 2017-04-05 LAB — MAGNESIUM: MAGNESIUM: 1.9 mg/dL (ref 1.5–2.5)

## 2017-04-05 MED ORDER — TRAZODONE HCL 150 MG PO TABS: ORAL_TABLET | ORAL | 0 refills | Status: DC

## 2017-04-05 MED ORDER — ALENDRONATE SODIUM 70 MG PO TABS: ORAL_TABLET | ORAL | 3 refills | Status: DC

## 2017-04-05 MED ORDER — PREDNISONE 20 MG PO TABS: ORAL_TABLET | ORAL | 0 refills | Status: DC

## 2017-04-06 LAB — URINALYSIS, ROUTINE W REFLEX MICROSCOPIC
Bilirubin Urine: NEGATIVE
Glucose, UA: NEGATIVE
HGB URINE DIPSTICK: NEGATIVE
KETONES UR: NEGATIVE
Leukocytes, UA: NEGATIVE
NITRITE: NEGATIVE
PH: 6.5 (ref 5.0–8.0)
Protein, ur: NEGATIVE
Specific Gravity, Urine: 1.01 (ref 1.001–1.035)

## 2017-04-06 LAB — MICROALBUMIN / CREATININE URINE RATIO
CREATININE, URINE: 42 mg/dL (ref 20–320)
MICROALB UR: 0.2 mg/dL
Microalb Creat Ratio: 5 mcg/mg creat (ref <30)

## 2017-04-06 LAB — HEMOGLOBIN A1C
HEMOGLOBIN A1C: 5.1 % (ref <5.7)
Mean Plasma Glucose: 100 mg/dL

## 2017-04-06 LAB — INSULIN, RANDOM: INSULIN: 6.5 u[IU]/mL (ref 2.0–19.6)

## 2017-04-06 LAB — VITAMIN D 25 HYDROXY (VIT D DEFICIENCY, FRACTURES): VIT D 25 HYDROXY: 53 ng/mL (ref 30–100)

## 2017-04-09 LAB — TB SKIN TEST
INDURATION: 0 mm
TB SKIN TEST: NEGATIVE

## 2017-04-10 ENCOUNTER — Other Ambulatory Visit: Payer: Self-pay | Admitting: Internal Medicine

## 2017-04-10 NOTE — Addendum Note (Signed)
Addended by: Unk Pinto on: 04/10/2017 04:37 PM   Modules accepted: Orders

## 2017-04-11 ENCOUNTER — Other Ambulatory Visit: Payer: Self-pay

## 2017-04-11 DIAGNOSIS — Z1212 Encounter for screening for malignant neoplasm of rectum: Principal | ICD-10-CM

## 2017-04-11 DIAGNOSIS — Z1211 Encounter for screening for malignant neoplasm of colon: Secondary | ICD-10-CM

## 2017-04-11 LAB — POC HEMOCCULT BLD/STL (HOME/3-CARD/SCREEN)
Card #2 Fecal Occult Blod, POC: NEGATIVE
FECAL OCCULT BLD: NEGATIVE
FECAL OCCULT BLD: NEGATIVE

## 2017-05-02 ENCOUNTER — Other Ambulatory Visit: Payer: Self-pay | Admitting: Internal Medicine

## 2017-05-02 DIAGNOSIS — F5101 Primary insomnia: Secondary | ICD-10-CM

## 2017-07-05 ENCOUNTER — Other Ambulatory Visit: Payer: Self-pay | Admitting: *Deleted

## 2017-07-05 DIAGNOSIS — M810 Age-related osteoporosis without current pathological fracture: Secondary | ICD-10-CM

## 2017-07-05 MED ORDER — ALENDRONATE SODIUM 70 MG PO TABS: ORAL_TABLET | ORAL | 3 refills | Status: DC

## 2017-07-16 ENCOUNTER — Ambulatory Visit: Payer: Self-pay | Admitting: Physician Assistant

## 2017-07-16 ENCOUNTER — Ambulatory Visit: Payer: No Typology Code available for payment source | Admitting: Physician Assistant

## 2017-07-16 ENCOUNTER — Encounter: Payer: Self-pay | Admitting: Physician Assistant

## 2017-07-16 VITALS — BP 120/82 | HR 85 | Temp 97.3°F | Resp 16 | Ht 60.75 in | Wt 111.0 lb

## 2017-07-16 DIAGNOSIS — E782 Mixed hyperlipidemia: Secondary | ICD-10-CM | POA: Diagnosis not present

## 2017-07-16 DIAGNOSIS — Z1159 Encounter for screening for other viral diseases: Secondary | ICD-10-CM

## 2017-07-16 DIAGNOSIS — E039 Hypothyroidism, unspecified: Secondary | ICD-10-CM | POA: Diagnosis not present

## 2017-07-16 DIAGNOSIS — Z79899 Other long term (current) drug therapy: Secondary | ICD-10-CM | POA: Diagnosis not present

## 2017-07-16 DIAGNOSIS — R030 Elevated blood-pressure reading, without diagnosis of hypertension: Secondary | ICD-10-CM | POA: Diagnosis not present

## 2017-07-16 NOTE — Patient Instructions (Addendum)
Your LDL is not in range or at goal.  Your LDL is the bad cholesterol that can lead to heart attack and stroke. To lower your number you can decrease your fatty foods, red meat, cheese, milk and increase fiber like whole grains and veggies. You can also add a fiber supplement like Citracel or Benefiber, these do not cause gas and bloating and are safe to use.    Cholesterol goal is less than 130 Yours was 161, will check this visit  Try diet, add on citracel, if not better will add on medication  Drink 80-100 oz a day of water, measure it out Slowly increase water  Fat and Cholesterol Restricted Diet High levels of fat and cholesterol in your blood may lead to various health problems, such as diseases of the heart, blood vessels, gallbladder, liver, and pancreas. Fats are concentrated sources of energy that come in various forms. Certain types of fat, including saturated fat, may be harmful in excess. Cholesterol is a substance needed by your body in small amounts. Your body makes all the cholesterol it needs. Excess cholesterol comes from the food you eat. When you have high levels of cholesterol and saturated fat in your blood, health problems can develop because the excess fat and cholesterol will gather along the walls of your blood vessels, causing them to narrow. Choosing the right foods will help you control your intake of fat and cholesterol. This will help keep the levels of these substances in your blood within normal limits and reduce your risk of disease. What is my plan? Your health care provider recommends that you:  Limit your fat intake to ______% or less of your total calories per day.  Limit the amount of cholesterol in your diet to less than _________mg per day.  Eat 20-30 grams of fiber each day.  What types of fat should I choose?  Choose healthy fats more often. Choose monounsaturated and polyunsaturated fats, such as olive and canola oil, flaxseeds, walnuts, almonds, and  seeds.  Eat more omega-3 fats. Good choices include salmon, mackerel, sardines, tuna, flaxseed oil, and ground flaxseeds. Aim to eat fish at least two times a week.  Limit saturated fats. Saturated fats are primarily found in animal products, such as meats, butter, and cream. Plant sources of saturated fats include palm oil, palm kernel oil, and coconut oil.  Avoid foods with partially hydrogenated oils in them. These contain trans fats. Examples of foods that contain trans fats are stick margarine, some tub margarines, cookies, crackers, and other baked goods. What general guidelines do I need to follow? These guidelines for healthy eating will help you control your intake of fat and cholesterol:  Check food labels carefully to identify foods with trans fats or high amounts of saturated fat.  Fill one half of your plate with vegetables and green salads.  Fill one fourth of your plate with whole grains. Look for the word "whole" as the first word in the ingredient list.  Fill one fourth of your plate with lean protein foods.  Limit fruit to two servings a day. Choose fruit instead of juice.  Eat more foods that contain fiber, such as apples, broccoli, carrots, beans, peas, and barley.  Eat more home-cooked food and less restaurant, buffet, and fast food.  Limit or avoid alcohol.  Limit foods high in starch and sugar.  Limit fried foods.  Cook foods using methods other than frying. Baking, boiling, grilling, and broiling are all great options.  Lose  weight if you are overweight. Losing just 5-10% of your initial body weight can help your overall health and prevent diseases such as diabetes and heart disease.  What foods can I eat? Grains  Whole grains, such as whole wheat or whole grain breads, crackers, cereals, and pasta. Unsweetened oatmeal, bulgur, barley, quinoa, or brown rice. Corn or whole wheat flour tortillas. Vegetables  Fresh or frozen vegetables (raw, steamed,  roasted, or grilled). Green salads. Fruits  All fresh, canned (in natural juice), or frozen fruits. Meats and other protein foods  Ground beef (85% or leaner), grass-fed beef, or beef trimmed of fat. Skinless chicken or [place]. Ground chicken or [place]. Pork trimmed of fat. All fish and seafood. Eggs. Dried beans, peas, or lentils. Unsalted nuts or seeds. Unsalted canned or dry beans. Dairy  Low-fat dairy products, such as skim or 1% milk, 2% or reduced-fat cheeses, low-fat ricotta or cottage cheese, or plain low-fat yo Fats and oils  Tub margarines without trans fats. Light or reduced-fat mayonnaise and salad dressings. Avocado. Olive, canola, sesame, or safflower oils. Natural peanut or almond butter (choose ones without added sugar and oil). The items listed above may not be a complete list of recommended foods or beverages. Contact your dietitian for more options. Foods to avoid Grains  White bread. White pasta. White rice. Cornbread. Bagels, pastries, and croissants. Crackers that contain trans fat. Vegetables  White potatoes. Corn. Creamed or fried vegetables. Vegetables in a cheese sauce. Fruits  Dried fruits. Canned fruit in light or heavy syrup. Fruit juice. Meats and other protein foods  Fatty cuts of meat. Ribs, chicken wings, bacon, sausage, bologna, salami, chitterlings, fatback, hot dogs, bratwurst, and packaged luncheon meats. Liver and organ meats. Dairy  Whole or 2% milk, cream, half-and-half, and cream cheese. Whole milk cheeses. Whole-fat or sweetened yogurt. Full-fat cheeses. Nondairy creamers and whipped toppings. Processed cheese, cheese spreads, or cheese curds. Beverages  Alcohol. Sweetened drinks (such as sodas, lemonade, and fruit drinks or punches). Fats and oils  Butter, stick margarine, lard, shortening, ghee, or bacon fat. Coconut, palm kernel, or palm oils. Sweets and desserts  Corn syrup, sugars, honey, and molasses. Candy. Jam and jelly. Syrup.  Sweetened cereals. Cookies, pies, cakes, donuts, muffins, and ice cream. The items listed above may not be a complete list of foods and beverages to avoid. Contact your dietitian for more information. This information is not intended to replace advice given to you by your health care provider. Make sure you discuss any questions you have with your health care provider. Document Released: 08/14/2005 Document Revised: 09/04/2014 Document Reviewed: 11/12/2013 Elsevier Interactive Patient Education  2017 [employer].
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
## 2017-07-17 LAB — CBC WITH DIFFERENTIAL/PLATELET
BASOS PCT: 1.5 %
Basophils Absolute: 72 cells/uL (ref 0–200)
EOS PCT: 1.3 %
Eosinophils Absolute: 62 cells/uL (ref 15–500)
HCT: 39.2 % (ref 35.0–45.0)
HEMOGLOBIN: 13.1 g/dL (ref 11.7–15.5)
LYMPHS ABS: 1766 {cells}/uL (ref 850–3900)
MCH: 31.5 pg (ref 27.0–33.0)
MCHC: 33.4 g/dL (ref 32.0–36.0)
MCV: 94.2 fL (ref 80.0–100.0)
MPV: 9.6 fL (ref 7.5–12.5)
Monocytes Relative: 8.2 %
NEUTROS ABS: 2506 {cells}/uL (ref 1500–7800)
NEUTROS PCT: 52.2 %
PLATELETS: 271 10*3/uL (ref 140–400)
RBC: 4.16 10*6/uL (ref 3.80–5.10)
RDW: 11.7 % (ref 11.0–15.0)
Total Lymphocyte: 36.8 %
WBC mixed population: 394 cells/uL (ref 200–950)
WBC: 4.8 10*3/uL (ref 3.8–10.8)

## 2017-07-17 LAB — LIPID PANEL
CHOL/HDL RATIO: 3 (calc) (ref <5.0)
CHOLESTEROL: 238 mg/dL — AB (ref <200)
HDL: 80 mg/dL (ref >50)
LDL Cholesterol (Calc): 140 mg/dL (calc) — ABNORMAL HIGH
Non-HDL Cholesterol (Calc): 158 mg/dL (calc) — ABNORMAL HIGH (ref <130)
Triglycerides: 82 mg/dL (ref <150)

## 2017-07-17 LAB — BASIC METABOLIC PANEL WITH GFR
BUN: 17 mg/dL (ref 7–25)
CHLORIDE: 101 mmol/L (ref 98–110)
CO2: 32 mmol/L (ref 20–32)
Calcium: 9.5 mg/dL (ref 8.6–10.4)
Creat: 0.78 mg/dL (ref 0.50–0.99)
GFR, EST AFRICAN AMERICAN: 93 mL/min/{1.73_m2} (ref >=60)
GFR, Est Non African American: 80 mL/min/{1.73_m2} (ref >=60)
GLUCOSE: 78 mg/dL (ref 65–99)
POTASSIUM: 5 mmol/L (ref 3.5–5.3)
Sodium: 140 mmol/L (ref 135–146)

## 2017-07-17 LAB — HEPATIC FUNCTION PANEL
AG Ratio: 1.7 (calc) (ref 1.0–2.5)
ALKALINE PHOSPHATASE (APISO): 51 U/L (ref 33–130)
ALT: 19 U/L (ref 6–29)
AST: 22 U/L (ref 10–35)
Albumin: 4.4 g/dL (ref 3.6–5.1)
BILIRUBIN DIRECT: 0.1 mg/dL (ref 0.0–0.2)
BILIRUBIN INDIRECT: 0.5 mg/dL (ref 0.2–1.2)
GLOBULIN: 2.6 g/dL (ref 1.9–3.7)
TOTAL PROTEIN: 7 g/dL (ref 6.1–8.1)
Total Bilirubin: 0.6 mg/dL (ref 0.2–1.2)

## 2017-07-17 LAB — HIV ANTIBODY (ROUTINE TESTING W REFLEX): HIV 1&2 Ab, 4th Generation: NONREACTIVE

## 2017-07-17 LAB — HEPATITIS C ANTIBODY
HEP C AB: NONREACTIVE
SIGNAL TO CUT-OFF: 0.01 (ref <1.00)

## 2017-07-17 LAB — TSH: TSH: 5.75 mIU/L — ABNORMAL HIGH (ref 0.40–4.50)

## 2017-07-17 LAB — MAGNESIUM: Magnesium: 2 mg/dL (ref 1.5–2.5)

## 2017-07-23 NOTE — Progress Notes (Signed)
Pt aware of lab results & voiced understanding of those results.

## 2017-07-27 ENCOUNTER — Ambulatory Visit (INDEPENDENT_AMBULATORY_CARE_PROVIDER_SITE_OTHER): Payer: No Typology Code available for payment source

## 2017-07-27 DIAGNOSIS — Z23 Encounter for immunization: Secondary | ICD-10-CM

## 2017-08-24 ENCOUNTER — Other Ambulatory Visit: Payer: No Typology Code available for payment source

## 2017-08-24 DIAGNOSIS — E039 Hypothyroidism, unspecified: Secondary | ICD-10-CM

## 2017-08-24 LAB — TSH: TSH: 7.61 mIU/L — ABNORMAL HIGH (ref 0.40–4.50)

## 2017-08-24 NOTE — Progress Notes (Signed)
Patient states she is taking Levothyroxine 75 mcg alternating with 50 mcg every other day.  She is taking the medication at 8-9 PM due to being on Fosamax in the AM.

## 2017-09-07 ENCOUNTER — Ambulatory Visit: Payer: Self-pay | Admitting: Internal Medicine

## 2017-09-09 ENCOUNTER — Other Ambulatory Visit: Payer: Self-pay | Admitting: Internal Medicine

## 2017-10-16 ENCOUNTER — Ambulatory Visit: Payer: Self-pay | Admitting: Internal Medicine

## 2017-10-29 ENCOUNTER — Encounter: Payer: Self-pay | Admitting: Internal Medicine

## 2017-10-29 ENCOUNTER — Ambulatory Visit: Payer: No Typology Code available for payment source | Admitting: Internal Medicine

## 2017-10-29 VITALS — BP 108/66 | HR 64 | Temp 97.6°F | Resp 16 | Ht 60.75 in | Wt 114.6 lb

## 2017-10-29 DIAGNOSIS — Z79899 Other long term (current) drug therapy: Secondary | ICD-10-CM | POA: Diagnosis not present

## 2017-10-29 DIAGNOSIS — E559 Vitamin D deficiency, unspecified: Secondary | ICD-10-CM | POA: Diagnosis not present

## 2017-10-29 DIAGNOSIS — E782 Mixed hyperlipidemia: Secondary | ICD-10-CM

## 2017-10-29 DIAGNOSIS — E039 Hypothyroidism, unspecified: Secondary | ICD-10-CM | POA: Diagnosis not present

## 2017-10-29 DIAGNOSIS — R7309 Other abnormal glucose: Secondary | ICD-10-CM | POA: Diagnosis not present

## 2017-10-29 DIAGNOSIS — R030 Elevated blood-pressure reading, without diagnosis of hypertension: Secondary | ICD-10-CM | POA: Diagnosis not present

## 2017-10-29 NOTE — Patient Instructions (Signed)

## 2017-10-29 NOTE — Progress Notes (Signed)
This very nice 67 y.o. MWF presents for 3 month follow up with HTN, HLD, Pre-Diabetes, hypothyroidism and Vitamin D Deficiency. Patient relates hx/o Colonoscopy 2016 in [place] and was advised 10 yr f/u.      Patient is followed expectantly for labile HTN & BP has been controlled at home. Today's BP is at goal - 108/66.  She had a Negative Nuclear stress test in 2015. Patient has had no complaints of any cardiac type chest pain, palpitations, dyspnea / orthopnea / PND, dizziness, claudication, or dependent edema.     Hyperlipidemia is not controlled with diet. Patient denies myalgias or other med SE's. Last Lipids were not at goal: Lab Results  Component Value Date   CHOL 238 (H) 07/16/2017   HDL 80 07/16/2017   LDLCALC 161 (H) 04/05/2017   TRIG 82 07/16/2017   CHOLHDL 3.0 07/16/2017      Also, the patient is monitored expectantly for PreDiabetes and has had no symptoms of reactive hypoglycemia, diabetic polys, paresthesias or visual blurring.  Last A1c was Normal & at goal:  Lab Results  Component Value Date   HGBA1C 5.1 04/05/2017      Patient has been on thyroid replacement since 2000. Further, the patient also has history of Vitamin D Deficiency and supplements vitamin D without any suspected side-effects. Last vitamin D was neat goal (70-100): Lab Results  Component Value Date   VD25OH 53 04/05/2017   Current Outpatient Medications on File Prior to Visit  Medication Sig  . alendronate (FOSAMAX) 70 MG tablet Take 1 tablet weekly 1st in the morning  on an empty stomach with only a glass of water for 1hour  . Ascorbic Acid (VITAMIN C) 1000 MG tablet Take 1,000 mg by mouth daily.  Marland Kitchen aspirin EC 81 MG tablet Take 81 mg by mouth daily.  . Calcium Citrate (CITRACAL PO) Take 1 tablet by mouth daily.  . Cholecalciferol (VITAMIN D-3) 5000 units TABS Take 1 tablet by mouth daily.  Marland Kitchen escitalopram (LEXAPRO) 20 MG tablet Take 20 mg by mouth daily.  . Flaxseed, Linseed, (FLAXSEED OIL) 1200 MG  CAPS Take 1 capsule by mouth daily.  . fluticasone (FLONASE) 50 MCG/ACT nasal spray Place 1 spray into both nostrils daily as needed for allergies or rhinitis.  Marland Kitchen levothyroxine (SYNTHROID, LEVOTHROID) 50 MCG tablet TAKE 1 TABLET (50 MCG TOTAL) BY MOUTH DAILY BEFORE BREAKFAST.  Marland Kitchen Omega-3 Fatty Acids (FISH OIL) 1200 MG CAPS Take 1 capsule by mouth daily.  . Probiotic Product (PROBIOTIC-10) CAPS Take 1 capsule by mouth daily.  Marland Kitchen Specialty Vitamins Products (ONE-A-DAY BONE STRENGTH PO) Take 1 tablet by mouth daily.   No current facility-administered medications on file prior to visit.    Allergies  Allergen Reactions  . Ciprofloxacin Other (See Comments)    Alleged tendonitis   PMHx:   Past Medical History:  Diagnosis Date  . Anxiety   . Diverticular disease   . Hyperlipidemia, mixed   . Hypothyroidism 2000  . Vitamin D deficiency    Immunization History  Administered Date(s) Administered  . Influenza Inj Mdck Quad With Preservative 07/27/2017  . PPD Test 04/05/2017  . Tdap 08/28/2010   Past Surgical History:  Procedure Laterality Date  . BREAST BIOPSY Left    benign  . CHOLECYSTECTOMY N/A 02/08/2017   Procedure: LAPAROSCOPIC CHOLECYSTECTOMY WITH INTRAOPERATIVE CHOLANGIOGRAM;  Surgeon: Donnie Mesa, MD;  Location: [facility];  Service: General;  Laterality: N/A;  . EYE SURGERY     Eyelid  lifted  . TONSILLECTOMY     FHx:    Reviewed / unchanged  SHx:    Reviewed / unchanged  Systems Review:  Constitutional: Denies fever, chills, wt changes, headaches, insomnia, fatigue, night sweats, change in appetite. Eyes: Denies redness, blurred vision, diplopia, discharge, itchy, watery eyes.  ENT: Denies discharge, congestion, post nasal drip, epistaxis, sore throat, earache, hearing loss, dental pain, tinnitus, vertigo, sinus pain, snoring.  CV: Denies chest pain, palpitations, irregular heartbeat, syncope, dyspnea, diaphoresis, orthopnea, PND, claudication or edema. Respiratory: denies  cough, dyspnea, DOE, pleurisy, hoarseness, laryngitis, wheezing.  Gastrointestinal: Denies dysphagia, odynophagia, heartburn, reflux, water brash, abdominal pain or cramps, nausea, vomiting, bloating, diarrhea, constipation, hematemesis, melena, hematochezia  or hemorrhoids. Genitourinary: Denies dysuria, frequency, urgency, nocturia, hesitancy, discharge, hematuria or flank pain. Musculoskeletal: Denies arthralgias, myalgias, stiffness, jt. swelling, pain, limping or strain/sprain.  Skin: Denies pruritus, rash, hives, warts, acne, eczema or change in skin lesion(s). Neuro: No weakness, tremor, incoordination, spasms, paresthesia or pain. Psychiatric: Denies confusion, memory loss or sensory loss. Endo: Denies change in weight, skin or hair change.  Heme/Lymph: No excessive bleeding, bruising or enlarged lymph nodes.  Physical Exam  BP 108/66   Pulse 64   Temp 97.6 F (36.4 C)   Resp 16   Ht 5' 0.75" (1.543 m)   Wt 114 lb 9.6 oz (52 kg)   BMI 21.83 kg/m   Appears  well nourished, well groomed  and in no distress.  Eyes: PERRLA, EOMs, conjunctiva no swelling or erythema. Sinuses: No frontal/maxillary tenderness ENT/Mouth: EAC's clear, TM's nl w/o erythema, bulging. Nares clear w/o erythema, swelling, exudates. Oropharynx clear without erythema or exudates. Oral hygiene is good. Tongue normal, non obstructing. Hearing intact.  Neck: Supple. Thyroid not palpable. Car 2+/2+ without bruits, nodes or JVD. Chest: Respirations nl with BS clear & equal w/o rales, rhonchi, wheezing or stridor.  Cor: Heart sounds normal w/ regular rate and rhythm without sig. murmurs, gallops, clicks or rubs. Peripheral pulses normal and equal  without edema.  Abdomen: Soft & bowel sounds normal. Non-tender w/o guarding, rebound, hernias, masses or organomegaly.  Lymphatics: Unremarkable.  Musculoskeletal: Full ROM all peripheral extremities, joint stability, 5/5 strength and normal gait.  Skin: Warm, dry  without exposed rashes, lesions or ecchymosis apparent.  Neuro: Cranial nerves intact, reflexes equal bilaterally. Sensory-motor testing grossly intact. Tendon reflexes grossly intact.  Pysch: Alert & oriented x 3.  Insight and judgement nl & appropriate. No ideations.  Assessment and Plan:  1. Elevated BP without diagnosis of hypertension  - Continue medication, monitor blood pressure at home.  - Continue DASH diet. Reminder to go to the ER if any CP,  SOB, nausea, dizziness, severe HA, changes vision/speech.  - CBC with Differential/Platelet - BASIC METABOLIC PANEL WITH GFR - Magnesium - TSH  2. Hyperlipidemia, mixed  - Continue diet/meds, exercise,& lifestyle modifications.  - Continue monitor periodic cholesterol/liver & renal functions   - Hepatic function panel - Lipid panel - TSH  3. Abnormal glucose  - Continue diet, exercise, lifestyle modifications.  - Monitor appropriate labs.  - Hemoglobin A1c - Insulin, random  4. Vitamin D deficiency  - Continue supplementation.  - VITAMIN D 25 Hydroxy  5. Hypothyroidism  - TSH  6. Medication management  - CBC with Differential/Platelet - BASIC METABOLIC PANEL WITH GFR - Hepatic function panel - Magnesium - Lipid panel - TSH - Hemoglobin A1c - Insulin, random - VITAMIN D 25 Hydroxy         Discussed  regular exercise, BP monitoring, weight control to achieve/maintain BMI less than 25 and discussed med and SE's. Recommended labs to assess and monitor clinical status with further disposition pending results of labs. Over 30 minutes of exam, counseling, chart review was performed.

## 2017-10-30 ENCOUNTER — Other Ambulatory Visit: Payer: Self-pay | Admitting: Internal Medicine

## 2017-10-30 LAB — BASIC METABOLIC PANEL WITH GFR
BUN: 24 mg/dL (ref 7–25)
CO2: 32 mmol/L (ref 20–32)
CREATININE: 0.77 mg/dL (ref 0.50–0.99)
Calcium: 10.3 mg/dL (ref 8.6–10.4)
Chloride: 100 mmol/L (ref 98–110)
GFR, EST AFRICAN AMERICAN: 95 mL/min/{1.73_m2} (ref >=60)
GFR, Est Non African American: 82 mL/min/{1.73_m2} (ref >=60)
Glucose, Bld: 96 mg/dL (ref 65–99)
Potassium: 4.6 mmol/L (ref 3.5–5.3)
Sodium: 142 mmol/L (ref 135–146)

## 2017-10-30 LAB — CBC WITH DIFFERENTIAL/PLATELET
BASOS PCT: 1 %
Basophils Absolute: 57 cells/uL (ref 0–200)
EOS ABS: 29 {cells}/uL (ref 15–500)
Eosinophils Relative: 0.5 %
HEMATOCRIT: 40.4 % (ref 35.0–45.0)
HEMOGLOBIN: 13.6 g/dL (ref 11.7–15.5)
Lymphs Abs: 1813 cells/uL (ref 850–3900)
MCH: 32 pg (ref 27.0–33.0)
MCHC: 33.7 g/dL (ref 32.0–36.0)
MCV: 95.1 fL (ref 80.0–100.0)
MONOS PCT: 5.9 %
MPV: 10.3 fL (ref 7.5–12.5)
NEUTROS ABS: 3466 {cells}/uL (ref 1500–7800)
Neutrophils Relative %: 60.8 %
Platelets: 271 10*3/uL (ref 140–400)
RBC: 4.25 10*6/uL (ref 3.80–5.10)
RDW: 11.8 % (ref 11.0–15.0)
Total Lymphocyte: 31.8 %
WBC: 5.7 10*3/uL (ref 3.8–10.8)
WBCMIX: 336 {cells}/uL (ref 200–950)

## 2017-10-30 LAB — HEPATIC FUNCTION PANEL
AG RATIO: 1.7 (calc) (ref 1.0–2.5)
ALT: 21 U/L (ref 6–29)
AST: 26 U/L (ref 10–35)
Albumin: 4.6 g/dL (ref 3.6–5.1)
Alkaline phosphatase (APISO): 44 U/L (ref 33–130)
BILIRUBIN DIRECT: 0.1 mg/dL (ref 0.0–0.2)
BILIRUBIN INDIRECT: 0.3 mg/dL (ref 0.2–1.2)
GLOBULIN: 2.7 g/dL (ref 1.9–3.7)
Total Bilirubin: 0.4 mg/dL (ref 0.2–1.2)
Total Protein: 7.3 g/dL (ref 6.1–8.1)

## 2017-10-30 LAB — LIPID PANEL
CHOL/HDL RATIO: 3 (calc) (ref <5.0)
Cholesterol: 247 mg/dL — ABNORMAL HIGH (ref <200)
HDL: 82 mg/dL (ref >50)
LDL CHOLESTEROL (CALC): 142 mg/dL — AB
Non-HDL Cholesterol (Calc): 165 mg/dL (calc) — ABNORMAL HIGH (ref <130)
Triglycerides: 111 mg/dL (ref <150)

## 2017-10-30 LAB — HEMOGLOBIN A1C
Hgb A1c MFr Bld: 5.3 % of total Hgb (ref <5.7)
Mean Plasma Glucose: 105 (calc)
eAG (mmol/L): 5.8 (calc)

## 2017-10-30 LAB — TSH: TSH: 5.2 m[IU]/L — AB (ref 0.40–4.50)

## 2017-10-30 LAB — MAGNESIUM: Magnesium: 2.1 mg/dL (ref 1.5–2.5)

## 2017-10-30 LAB — VITAMIN D 25 HYDROXY (VIT D DEFICIENCY, FRACTURES): VIT D 25 HYDROXY: 62 ng/mL (ref 30–100)

## 2017-10-30 LAB — INSULIN, RANDOM: Insulin: 4.2 u[IU]/mL (ref 2.0–19.6)

## 2017-10-30 MED ORDER — ROSUVASTATIN CALCIUM 40 MG PO TABS: ORAL_TABLET | ORAL | 5 refills | Status: DC

## 2017-11-15 ENCOUNTER — Other Ambulatory Visit: Payer: Self-pay | Admitting: Internal Medicine

## 2017-11-15 ENCOUNTER — Other Ambulatory Visit: Payer: Self-pay | Admitting: Obstetrics and Gynecology

## 2017-11-15 DIAGNOSIS — Z1231 Encounter for screening mammogram for malignant neoplasm of breast: Secondary | ICD-10-CM

## 2017-12-17 ENCOUNTER — Other Ambulatory Visit: Payer: Self-pay | Admitting: Internal Medicine

## 2017-12-17 MED ORDER — AZITHROMYCIN 250 MG PO TABS: ORAL_TABLET | ORAL | 1 refills | Status: DC

## 2017-12-17 MED ORDER — PREDNISONE 20 MG PO TABS: ORAL_TABLET | ORAL | 0 refills | Status: DC

## 2017-12-26 ENCOUNTER — Ambulatory Visit: Payer: BLUE CROSS/BLUE SHIELD

## 2018-02-05 NOTE — Progress Notes (Signed)
FOLLOW UP  Assessment and Plan:   Hypothyroidism continue medications the same pending lab results reminded to take on an empty stomach 30-28mins before food.  check TSH level  Cholesterol Currently above goal; newly on Crestor, titrate Continue low cholesterol diet and exercise.  Check lipid panel.   BMI 21 Continue to recommend diet heavy in fruits and veggies and low in animal meats, cheeses, and dairy products, appropriate calorie intake Discuss exercise recommendations routinely Continue to monitor weight at each visit  Vitamin D Def At goal at last visit; continue supplementation to maintain goal of 70-100 Defer Vit D level  Depression/anxiety Continue medications  Lifestyle discussed: diet/exerise, sleep hygiene, stress management, hydration  Insomnia - good sleep hygiene discussed, increase day time activity, try melatonin or benadryl if this does not help we will call in sleep medication.    Continue diet and meds as discussed. Further disposition pending results of labs. Discussed med's effects and SE's.   Over 30 minutes of exam, counseling, chart review, and critical decision making was performed.   Future Appointments  Date Time Provider [place]  05/10/2018 10:00 AM Unk Pinto, MD [id number] None    ----------------------------------------------------------------------------------------------------------------------  HPI 67 y.o. female  presents for 3 month follow up on cholesterol, hypothyroid, depression and vitamin D deficiency. She reports some fatigue and insomnia, has tried melatonin but occasionally takes ~3 hours to fall asleep.   she has a diagnosis of anxiety and is currently on lexapro 20 mg daily, reports symptoms are well controlled on current regimen.   BMI is Body mass index is 21.72 kg/m., she has been working on diet and exercise. Wt Readings from Last 3 Encounters:  02/06/18 114 lb (51.7 kg)  10/29/17 114 lb 9.6 oz (52  kg)  07/16/17 111 lb (50.3 kg)   Her blood pressure has been controlled at home, today their BP is BP: 120/82  She does workout. She denies chest pain, shortness of breath, dizziness.   She is on cholesterol medication (rosuvastatin 20 mg three days weekly) and denies myalgias. Her cholesterol is not at goal. The cholesterol last visit was:   Lab Results  Component Value Date   CHOL 247 (H) 10/29/2017   HDL 82 10/29/2017   LDLCALC 142 (H) 10/29/2017   TRIG 111 10/29/2017   CHOLHDL 3.0 10/29/2017    She has been working on diet and exercise for glucose management, and denies foot ulcerations, increased appetite, nausea, paresthesia of the feet, polydipsia, polyuria, visual disturbances, vomiting and weight loss. Last A1C in the office was:  Lab Results  Component Value Date   HGBA1C 5.3 10/29/2017   She is on thyroid medication. Her medication was changed last visit.   Lab Results  Component Value Date   TSH 5.20 (H) 10/29/2017   Patient is on Vitamin D supplement.   Lab Results  Component Value Date   VD25OH 62 10/29/2017        Current Medications:  Current Outpatient Medications on File Prior to Visit  Medication Sig  . alendronate (FOSAMAX) 70 MG tablet Take 1 tablet weekly 1st in the morning  on an empty stomach with only a glass of water for 1hour  . Ascorbic Acid (VITAMIN C) 1000 MG tablet Take 1,000 mg by mouth daily.  Marland Kitchen aspirin EC 81 MG tablet Take 81 mg by mouth daily.  . Calcium Citrate (CITRACAL PO) Take 1 tablet by mouth daily.  . Cholecalciferol (VITAMIN D-3) 5000 units TABS Take 1 tablet by mouth  daily.  . escitalopram (LEXAPRO) 20 MG tablet Take 20 mg by mouth daily.  . Flaxseed, Linseed, (FLAXSEED OIL) 1200 MG CAPS Take 1 capsule by mouth daily.  Marland Kitchen levothyroxine (SYNTHROID, LEVOTHROID) 50 MCG tablet TAKE 1 TABLET (50 MCG TOTAL) BY MOUTH DAILY BEFORE BREAKFAST.  Marland Kitchen Omega-3 Fatty Acids (FISH OIL) 1200 MG CAPS Take 1 capsule by mouth daily.  . Probiotic  Product (PROBIOTIC-10) CAPS Take 1 capsule by mouth daily.  . rosuvastatin (CRESTOR) 40 MG tablet Take 1/2 to 1 tablet daily or as directed for Cholesterol  . Specialty Vitamins Products (ONE-A-DAY BONE STRENGTH PO) Take 1 tablet by mouth daily.  . fluticasone (FLONASE) 50 MCG/ACT nasal spray Place 1 spray into both nostrils daily as needed for allergies or rhinitis.   No current facility-administered medications on file prior to visit.      Allergies:  Allergies  Allergen Reactions  . Ciprofloxacin Other (See Comments)    Alleged tendonitis     Medical History:  Past Medical History:  Diagnosis Date  . Anxiety   . Diverticular disease   . Hyperlipidemia, mixed   . Hypothyroidism 2000  . Vitamin D deficiency    Family history- Reviewed and unchanged Social history- Reviewed and unchanged   Review of Systems:  Review of Systems  Constitutional: Negative for malaise/fatigue and weight loss.  HENT: Negative for hearing loss and tinnitus.   Eyes: Negative for blurred vision and double vision.  Respiratory: Negative for cough, shortness of breath and wheezing.   Cardiovascular: Negative for chest pain, palpitations, orthopnea, claudication and leg swelling.  Gastrointestinal: Negative for abdominal pain, blood in stool, constipation, diarrhea, heartburn, melena, nausea and vomiting.  Genitourinary: Negative.   Musculoskeletal: Negative for joint pain and myalgias.  Skin: Negative for rash.  Neurological: Negative for dizziness, tingling, sensory change, weakness and headaches.  Endo/Heme/Allergies: Negative for polydipsia.  Psychiatric/Behavioral: Negative for depression, hallucinations and substance abuse. The patient has insomnia. The patient is not nervous/anxious.   All other systems reviewed and are negative.    Physical Exam: BP 120/82   Pulse 64   Temp (!) 97.3 F (36.3 C)   Ht 5' 0.75" (1.543 m)   Wt 114 lb (51.7 kg)   SpO2 98%   BMI 21.72 kg/m  Wt Readings  from Last 3 Encounters:  02/06/18 114 lb (51.7 kg)  10/29/17 114 lb 9.6 oz (52 kg)  07/16/17 111 lb (50.3 kg)   General Appearance: Well nourished, in no apparent distress. Eyes: PERRLA, EOMs, conjunctiva no swelling or erythema Sinuses: No Frontal/maxillary tenderness ENT/Mouth: Ext aud canals clear, TMs without erythema, bulging. No erythema, swelling, or exudate on post pharynx.  Tonsils not swollen or erythematous. Hearing normal.  Neck: Supple, thyroid normal.  Respiratory: Respiratory effort normal, BS equal bilaterally without rales, rhonchi, wheezing or stridor.  Cardio: RRR with no MRGs. Brisk peripheral pulses without edema.  Abdomen: Soft, + BS.  Non tender, no guarding, rebound, hernias, masses. Lymphatics: Non tender without lymphadenopathy.  Musculoskeletal: Full ROM, 5/5 strength, Normal gait Skin: Warm, dry without rashes, lesions, ecchymosis.  Neuro: Cranial nerves intact. No cerebellar symptoms.  Psych: Awake and oriented X 3, normal affect, Insight and Judgment appropriate.    Izora Ribas, NP 12:07 PM [facility]

## 2018-02-06 ENCOUNTER — Encounter: Payer: Self-pay | Admitting: Adult Health

## 2018-02-06 ENCOUNTER — Ambulatory Visit: Payer: No Typology Code available for payment source | Admitting: Adult Health

## 2018-02-06 VITALS — BP 120/82 | HR 64 | Temp 97.3°F | Ht 60.75 in | Wt 114.0 lb

## 2018-02-06 DIAGNOSIS — Z6821 Body mass index (BMI) 21.0-21.9, adult: Secondary | ICD-10-CM

## 2018-02-06 DIAGNOSIS — F329 Major depressive disorder, single episode, unspecified: Secondary | ICD-10-CM

## 2018-02-06 DIAGNOSIS — Z79899 Other long term (current) drug therapy: Secondary | ICD-10-CM | POA: Diagnosis not present

## 2018-02-06 DIAGNOSIS — E039 Hypothyroidism, unspecified: Secondary | ICD-10-CM | POA: Diagnosis not present

## 2018-02-06 DIAGNOSIS — E785 Hyperlipidemia, unspecified: Secondary | ICD-10-CM

## 2018-02-06 DIAGNOSIS — F32A Depression, unspecified: Secondary | ICD-10-CM

## 2018-02-06 LAB — CBC WITH DIFFERENTIAL/PLATELET
BASOS ABS: 60 {cells}/uL (ref 0–200)
Basophils Relative: 1 %
EOS ABS: 42 {cells}/uL (ref 15–500)
Eosinophils Relative: 0.7 %
HEMATOCRIT: 39 % (ref 35.0–45.0)
HEMOGLOBIN: 13.3 g/dL (ref 11.7–15.5)
LYMPHS ABS: 1986 {cells}/uL (ref 850–3900)
MCH: 32 pg (ref 27.0–33.0)
MCHC: 34.1 g/dL (ref 32.0–36.0)
MCV: 94 fL (ref 80.0–100.0)
MONOS PCT: 8 %
MPV: 10.2 fL (ref 7.5–12.5)
NEUTROS ABS: 3432 {cells}/uL (ref 1500–7800)
Neutrophils Relative %: 57.2 %
Platelets: 248 10*3/uL (ref 140–400)
RBC: 4.15 10*6/uL (ref 3.80–5.10)
RDW: 11.7 % (ref 11.0–15.0)
Total Lymphocyte: 33.1 %
WBC mixed population: 480 cells/uL (ref 200–950)
WBC: 6 10*3/uL (ref 3.8–10.8)

## 2018-02-06 LAB — COMPLETE METABOLIC PANEL WITH GFR
AG Ratio: 1.7 (calc) (ref 1.0–2.5)
ALBUMIN MSPROF: 4.5 g/dL (ref 3.6–5.1)
ALKALINE PHOSPHATASE (APISO): 46 U/L (ref 33–130)
ALT: 23 U/L (ref 6–29)
AST: 25 U/L (ref 10–35)
BUN: 15 mg/dL (ref 7–25)
CHLORIDE: 104 mmol/L (ref 98–110)
CO2: 29 mmol/L (ref 20–32)
Calcium: 9.8 mg/dL (ref 8.6–10.4)
Creat: 0.65 mg/dL (ref 0.50–0.99)
GFR, Est African American: 109 mL/min/{1.73_m2} (ref >=60)
GFR, Est Non African American: 94 mL/min/{1.73_m2} (ref >=60)
GLOBULIN: 2.6 g/dL (ref 1.9–3.7)
Glucose, Bld: 80 mg/dL (ref 65–99)
Potassium: 6.3 mmol/L (ref 3.5–5.3)
SODIUM: 139 mmol/L (ref 135–146)
Total Bilirubin: 0.6 mg/dL (ref 0.2–1.2)
Total Protein: 7.1 g/dL (ref 6.1–8.1)

## 2018-02-06 LAB — LIPID PANEL
CHOL/HDL RATIO: 2.5 (calc) (ref <5.0)
CHOLESTEROL: 194 mg/dL (ref <200)
HDL: 78 mg/dL (ref >50)
LDL Cholesterol (Calc): 102 mg/dL (calc) — ABNORMAL HIGH
Non-HDL Cholesterol (Calc): 116 mg/dL (calc) (ref <130)
Triglycerides: 58 mg/dL (ref <150)

## 2018-02-06 LAB — TSH: TSH: 7.54 mIU/L — ABNORMAL HIGH (ref 0.40–4.50)

## 2018-02-06 NOTE — Patient Instructions (Signed)
Aim for 7+ servings of fruits and vegetables daily  80+ fluid ounces of water or unsweet tea for healthy kidneys  Limit alcohol intake, avoid drinking late at night  Limit animal fats in diet for cholesterol and heart health - choose grass fed whenever available  Aim for low stress - take time to unwind and care for your mental health  Aim for 150 min of moderate intensity exercise weekly for heart health, and weights twice weekly for bone health  Aim for 7-9 hours of sleep daily     Maintaining good sleep hygiene and having good sleep habits lower your risk of developing sleep problems. Getting better sleep can also improve your concentration and alertness. Try the simple steps in this guide. If you still have trouble getting enough rest, make an appointment with your health care provider.   Can try melatonin 5mg -15 mg at night for sleep, can also do benadryl 25-50mg  at night for sleep.  If this does not help we can try prescription medication.  Also here is some information about good sleep hygiene.     11 Tips to Follow:  1. No caffeine after 3pm: Avoid beverages with caffeine (soda, tea, energy drinks, etc.) especially after 3pm. 2. Don't go to bed hungry: Have your evening meal at least 3 hrs. before going to sleep. It's fine to have a small bedtime snack such as a glass of milk and a few crackers but don't have a big meal. 3. Have a nightly routine before bed: Plan on "winding down" before you go to sleep. Begin relaxing about 1 hour before you go to bed. Try doing a quiet activity such as listening to calming music, reading a book or meditating. 4. Turn off the TV and ALL electronics including video games, tablets, laptops, etc. 1 hour before sleep, and keep them out of the bedroom. 5. Turn off your cell phone and all notifications (new email and text alerts) or even better, leave your phone outside your room while you sleep. Studies have shown that a part of your brain  continues to respond to certain lights and sounds even while you're still asleep. 6. Make your bedroom quiet, dark and cool. If you can't control the noise, try wearing earplugs or using a fan to block out other sounds. 7. Practice relaxation techniques. Try reading a book or meditating or drain your brain by writing a list of what you need to do the next day. 8. Don't nap unless you feel sick: you'll have a better night's sleep. 9. Don't smoke, or quit if you do. Nicotine, alcohol, and marijuana can all keep you awake. Talk to your health care provider if you need help with substance use. 10. Most importantly, wake up at the same time every day (or within 1 hour of your usual wake up time) EVEN on the weekends. A regular wake up time promotes sleep hygiene and prevents sleep problems. 11. Reduce exposure to bright light in the last three hours of the day before going to sleep.    Insomnia Insomnia is frequent trouble falling and/or staying asleep. Insomnia can be a long term problem or a short term problem. Both are common. Insomnia can be a short term problem when the wakefulness is related to a certain stress or worry. Long term insomnia is often related to ongoing stress during waking hours and/or poor sleeping habits. Overtime, sleep deprivation itself can make the problem worse. Every little thing feels more severe because you are overtired  and your ability to cope is decreased. CAUSES   Stress, anxiety, and depression.  Poor sleeping habits.  Distractions such as TV in the bedroom.  Naps close to bedtime.  Engaging in emotionally charged conversations before bed.  Technical reading before sleep.  Alcohol and other sedatives. They may make the problem worse. They can hurt normal sleep patterns and normal dream activity.  Stimulants such as caffeine for several hours prior to bedtime.  Pain syndromes and shortness of breath can cause insomnia.  Exercise late at night.  Changing  time zones may cause sleeping problems (jet lag). It is sometimes helpful to have someone observe your sleeping patterns. They should look for periods of not breathing during the night (sleep apnea). They should also look to see how long those periods last. If you live alone or observers are uncertain, you can also be observed at a sleep clinic where your sleep patterns will be professionally monitored. Sleep apnea requires a checkup and treatment. Give your caregivers your medical history. Give your caregivers observations your family has made about your sleep.  SYMPTOMS   Not feeling rested in the morning.  Anxiety and restlessness at bedtime.  Difficulty falling and staying asleep. TREATMENT   Your caregiver may prescribe treatment for an underlying medical disorders. Your caregiver can give advice or help if you are using alcohol or other drugs for self-medication. Treatment of underlying problems will usually eliminate insomnia problems.  Medications can be prescribed for short time use. They are generally not recommended for lengthy use.  Over-the-counter sleep medicines are not recommended for lengthy use. They can be habit forming.  You can promote easier sleeping by making lifestyle changes such as:  Using relaxation techniques that help with breathing and reduce muscle tension.  Exercising earlier in the day.  Changing your diet and the time of your last meal. No night time snacks.  Establish a regular time to go to bed.  Counseling can help with stressful problems and worry.  Soothing music and white noise may be helpful if there are background noises you cannot remove.  Stop tedious detailed work at least one hour before bedtime. HOME CARE INSTRUCTIONS   Keep a diary. Inform your caregiver about your progress. This includes any medication side effects. See your caregiver regularly. Take note of:  Times when you are asleep.  Times when you are awake during the  night.  The quality of your sleep.  How you feel the next day. This information will help your caregiver care for you.  Get out of bed if you are still awake after 15 minutes. Read or do some quiet activity. Keep the lights down. Wait until you feel sleepy and go back to bed.  Keep regular sleeping and waking hours. Avoid naps.  Exercise regularly.  Avoid distractions at bedtime. Distractions include watching television or engaging in any intense or detailed activity like attempting to balance the household checkbook.  Develop a bedtime ritual. Keep a familiar routine of bathing, brushing your teeth, climbing into bed at the same time each night, listening to soothing music. Routines increase the success of falling to sleep faster.  Use relaxation techniques. This can be using breathing and muscle tension release routines. It can also include visualizing peaceful scenes. You can also help control troubling or intruding thoughts by keeping your mind occupied with boring or repetitive thoughts like the old concept of counting sheep. You can make it more creative like imagining planting one beautiful flower after  another in your backyard garden.  During your day, work to eliminate stress. When this is not possible use some of the previous suggestions to help reduce the anxiety that accompanies stressful situations. MAKE SURE YOU:   Understand these instructions.  Will watch your condition.  Will get help right away if you are not doing well or get worse. Document Released: 08/11/2000 Document Revised: 11/06/2011 Document Reviewed: 09/11/2007 [facility] Patient Information 2015 [place], [place]. This information is not intended to replace advice given to you by your health care provider. Make sure you discuss any questions you have with your health care provider.

## 2018-02-07 ENCOUNTER — Other Ambulatory Visit: Payer: No Typology Code available for payment source

## 2018-02-07 ENCOUNTER — Other Ambulatory Visit: Payer: Self-pay | Admitting: Adult Health

## 2018-02-07 DIAGNOSIS — E875 Hyperkalemia: Secondary | ICD-10-CM

## 2018-02-07 LAB — BASIC METABOLIC PANEL WITH GFR
BUN: 17 mg/dL (ref 7–25)
CALCIUM: 9.3 mg/dL (ref 8.6–10.4)
CO2: 30 mmol/L (ref 20–32)
CREATININE: 0.65 mg/dL (ref 0.50–0.99)
Chloride: 104 mmol/L (ref 98–110)
GFR, EST AFRICAN AMERICAN: 109 mL/min/{1.73_m2} (ref >=60)
GFR, Est Non African American: 94 mL/min/{1.73_m2} (ref >=60)
Glucose, Bld: 87 mg/dL (ref 65–99)
Potassium: 5 mmol/L (ref 3.5–5.3)
Sodium: 139 mmol/L (ref 135–146)

## 2018-02-14 LAB — HM MAMMOGRAPHY

## 2018-04-11 ENCOUNTER — Ambulatory Visit: Payer: No Typology Code available for payment source | Admitting: Internal Medicine

## 2018-04-11 VITALS — BP 116/64 | HR 60 | Temp 97.3°F | Resp 16 | Ht 60.75 in | Wt 114.6 lb

## 2018-04-11 DIAGNOSIS — K30 Functional dyspepsia: Secondary | ICD-10-CM

## 2018-04-11 DIAGNOSIS — G4700 Insomnia, unspecified: Secondary | ICD-10-CM | POA: Diagnosis not present

## 2018-04-11 MED ORDER — OMEPRAZOLE 40 MG PO CPDR: DELAYED_RELEASE_CAPSULE | ORAL | 3 refills | Status: DC

## 2018-04-11 MED ORDER — AMITRIPTYLINE HCL 10 MG PO TABS: ORAL_TABLET | ORAL | 1 refills | Status: DC

## 2018-04-11 NOTE — Progress Notes (Signed)
Subjective:    Patient ID: Lenia Housley, female    DOB: 15-Jan-1951, 67 y.o.   MRN: [id number]  HPI   This very nice 67 yo MWF with HTN, HLD, Pre-Diabetes, hypothyroidism and Vitamin D Deficiency who had laparoscopic cholecystectomy in June 2018. Patient reports a 30 yr hx/o "burping". She had evaluations for this before moving to [place] several years ago. She reports occasional nausea . Also, she occasionally "feels starving" or "gnawing" She has not tried any OTC H2B or PPI's. She os on multiple OTC supplements. Upon the advice of a friend with similar sx's, she would like to try amitriptyline .  Medication Sig  . alendronate (FOSAMAX) 70 MG tablet Take 1 tablet weekly 1st in the morning  on an empty stomach with only a glass of water for 1hour  . Ascorbic Acid (VITAMIN C) 1000 MG tablet Take 1,000 mg by mouth daily.  Marland Kitchen aspirin EC 81 MG tablet Take 81 mg by mouth daily.  . Calcium Citrate (CITRACAL PO) Take 1 tablet by mouth daily.  . Cholecalciferol (VITAMIN D-3) 5000 units TABS Take 1 tablet by mouth daily.  Marland Kitchen escitalopram (LEXAPRO) 20 MG tablet Take 20 mg by mouth daily.  . Flaxseed, Linseed, (FLAXSEED OIL) 1200 MG CAPS Take 1 capsule by mouth daily.  . fluticasone (FLONASE) 50 MCG/ACT nasal spray Place 1 spray into both nostrils daily as needed for allergies or rhinitis.  Marland Kitchen levothyroxine (SYNTHROID, LEVOTHROID) 50 MCG tablet TAKE 1 TABLET (50 MCG TOTAL) BY MOUTH DAILY BEFORE BREAKFAST.  Marland Kitchen Omega-3 Fatty Acids (FISH OIL) 1200 MG CAPS Take 1 capsule by mouth daily.  Marland Kitchen OVER THE COUNTER MEDICATION Patient takes "Strenght of [place] Hill" organic apple cider vinegar with added herbs and spices. 1-2 TBSP daily.  . Probiotic Product (PROBIOTIC-10) CAPS Take 1 capsule by mouth daily.  . rosuvastatin (CRESTOR) 40 MG tablet Take 1/2 to 1 tablet daily or as directed for Cholesterol  . Specialty Vitamins Products (ONE-A-DAY BONE STRENGTH PO) Take 1 tablet by mouth daily.   Allergies  Allergen Reactions   . Ciprofloxacin Other (See Comments)    Alleged tendonitis   Past Medical History:  Diagnosis Date  . Anxiety   . Diverticular disease   . Hyperlipidemia, mixed   . Hypothyroidism 2000  . Vitamin D deficiency    Past Surgical History:  Procedure Laterality Date  . BREAST BIOPSY Left    benign  . CHOLECYSTECTOMY N/A 02/08/2017   Procedure: LAPAROSCOPIC CHOLECYSTECTOMY WITH INTRAOPERATIVE CHOLANGIOGRAM;  Surgeon: Donnie Mesa, MD;  Location: [facility];  Service: General;  Laterality: N/A;  . EYE SURGERY     Eyelid lifted  . TONSILLECTOMY         10 point systems review negative except as above.    Objective:   Physical Exam , BP 116/64   Pulse 60   Temp (!) 97.3 F (36.3 C)   Resp 16   Ht 5' 0.75" (1.543 m)   Wt 114 lb 9.6 oz (52 kg)   BMI 21.83 kg/m   HEENT - WNL. Neck - supple.  Chest - Clear equal BS. Cor - Nl HS. RRR w/o sig m. PP 1(+). No edema. Abd -  MS - FROM w/o deformities.  Gait Nl. Neuro -  Nl w/o focal abnormalities.    Assessment & Plan:    1. 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  Insomnia  - amitriptyline (ELAVIL) 10 MG tablet; Take 1 to 2 tablets at bedtime  Dispense: 180 tablet; Refill: 1    .

## 2018-04-14 ENCOUNTER — Encounter: Payer: Self-pay | Admitting: Internal Medicine

## 2018-04-14 NOTE — Patient Instructions (Signed)
Food Choices for Gastroesophageal Reflux Disease, Adult When you have gastroesophageal reflux disease (GERD), the foods you eat and your eating habits are very important. Choosing the right foods can help ease the discomfort of GERD. Consider working with a diet and nutrition specialist (dietitian) to help you make healthy food choices. What general guidelines should I follow? Eating plan  Choose healthy foods low in fat, such as fruits, vegetables, whole grains, low-fat dairy products, and lean meat, fish, and poultry.  Eat frequent, small meals instead of three large meals each day. Eat your meals slowly, in a relaxed setting. Avoid bending over or lying down until 2-3 hours after eating.  Limit high-fat foods such as fatty meats or fried foods.  Limit your intake of oils, butter, and shortening to less than 8 teaspoons each day.  Avoid the following: ? Foods that cause symptoms. These may be different for different people. Keep a food diary to keep track of foods that cause symptoms. ? Alcohol. ? Drinking large amounts of liquid with meals. ? Eating meals during the 2-3 hours before bed.  Cook foods using methods other than frying. This may include baking, grilling, or broiling. Lifestyle   Maintain a healthy weight. Ask your health care provider what weight is healthy for you. If you need to lose weight, work with your health care provider to do so safely.  Exercise for at least 30 minutes on 5 or more days each week, or as told by your health care provider.  Avoid wearing clothes that fit tightly around your waist and chest.  Do not use any products that contain nicotine or tobacco, such as cigarettes and e-cigarettes. If you need help quitting, ask your health care provider.  Sleep with the head of your bed raised. Use a wedge under the mattress or blocks under the bed frame to raise the head of the bed. What foods are not recommended? The items listed may not be a complete  list. Talk with your dietitian about what dietary choices are best for you. Grains Pastries or quick breads with added fat. French toast. Vegetables Deep fried vegetables. French fries. Any vegetables prepared with added fat. Any vegetables that cause symptoms. For some people this may include tomatoes and tomato products, chili peppers, onions and garlic, and horseradish. Fruits Any fruits prepared with added fat. Any fruits that cause symptoms. For some people this may include citrus fruits, such as oranges, grapefruit, pineapple, and lemons. Meats and other protein foods High-fat meats, such as fatty beef or pork, hot dogs, ribs, ham, sausage, salami and bacon. Fried meat or protein, including fried fish and fried chicken. Nuts and nut butters. Dairy Whole milk and chocolate milk. Sour cream. Cream. Ice cream. Cream cheese. Milk shakes. Beverages Coffee and tea, with or without caffeine. Carbonated beverages. Sodas. Energy drinks. Fruit juice made with acidic fruits (such as orange or grapefruit). Tomato juice. Alcoholic drinks. Fats and oils Butter. Margarine. Shortening. Ghee. Sweets and desserts Chocolate and cocoa. Donuts. Seasoning and other foods Pepper. Peppermint and spearmint. Any condiments, herbs, or seasonings that cause symptoms. For some people, this may include curry, hot sauce, or vinegar-based salad dressings. Summary  When you have gastroesophageal reflux disease (GERD), food and lifestyle choices are very important to help ease the discomfort of GERD.  Eat frequent, small meals instead of three large meals each day. Eat your meals slowly, in a relaxed setting. Avoid bending over or lying down until 2-3 hours after eating.  Limit high-fat   foods such as fatty meat or fried foods. This information is not intended to replace advice given to you by your health care provider. Make sure you discuss any questions you have with your health care provider. Document Released:  08/14/2005 Document Revised: 08/15/2016 Document Reviewed: 08/15/2016 Elsevier Interactive Patient Education  2018 [employer]  

## 2018-04-27 IMAGING — CR DG CHEST 2V
2 series · 2 of 2 positions shown · non-contrast
Comparison: None in PACs

CLINICAL DATA: Mid chest and upper abdominal pain pain beginning
this morning. Nonsmoker.

EXAM:
CHEST  2 VIEW

[w chest pa]
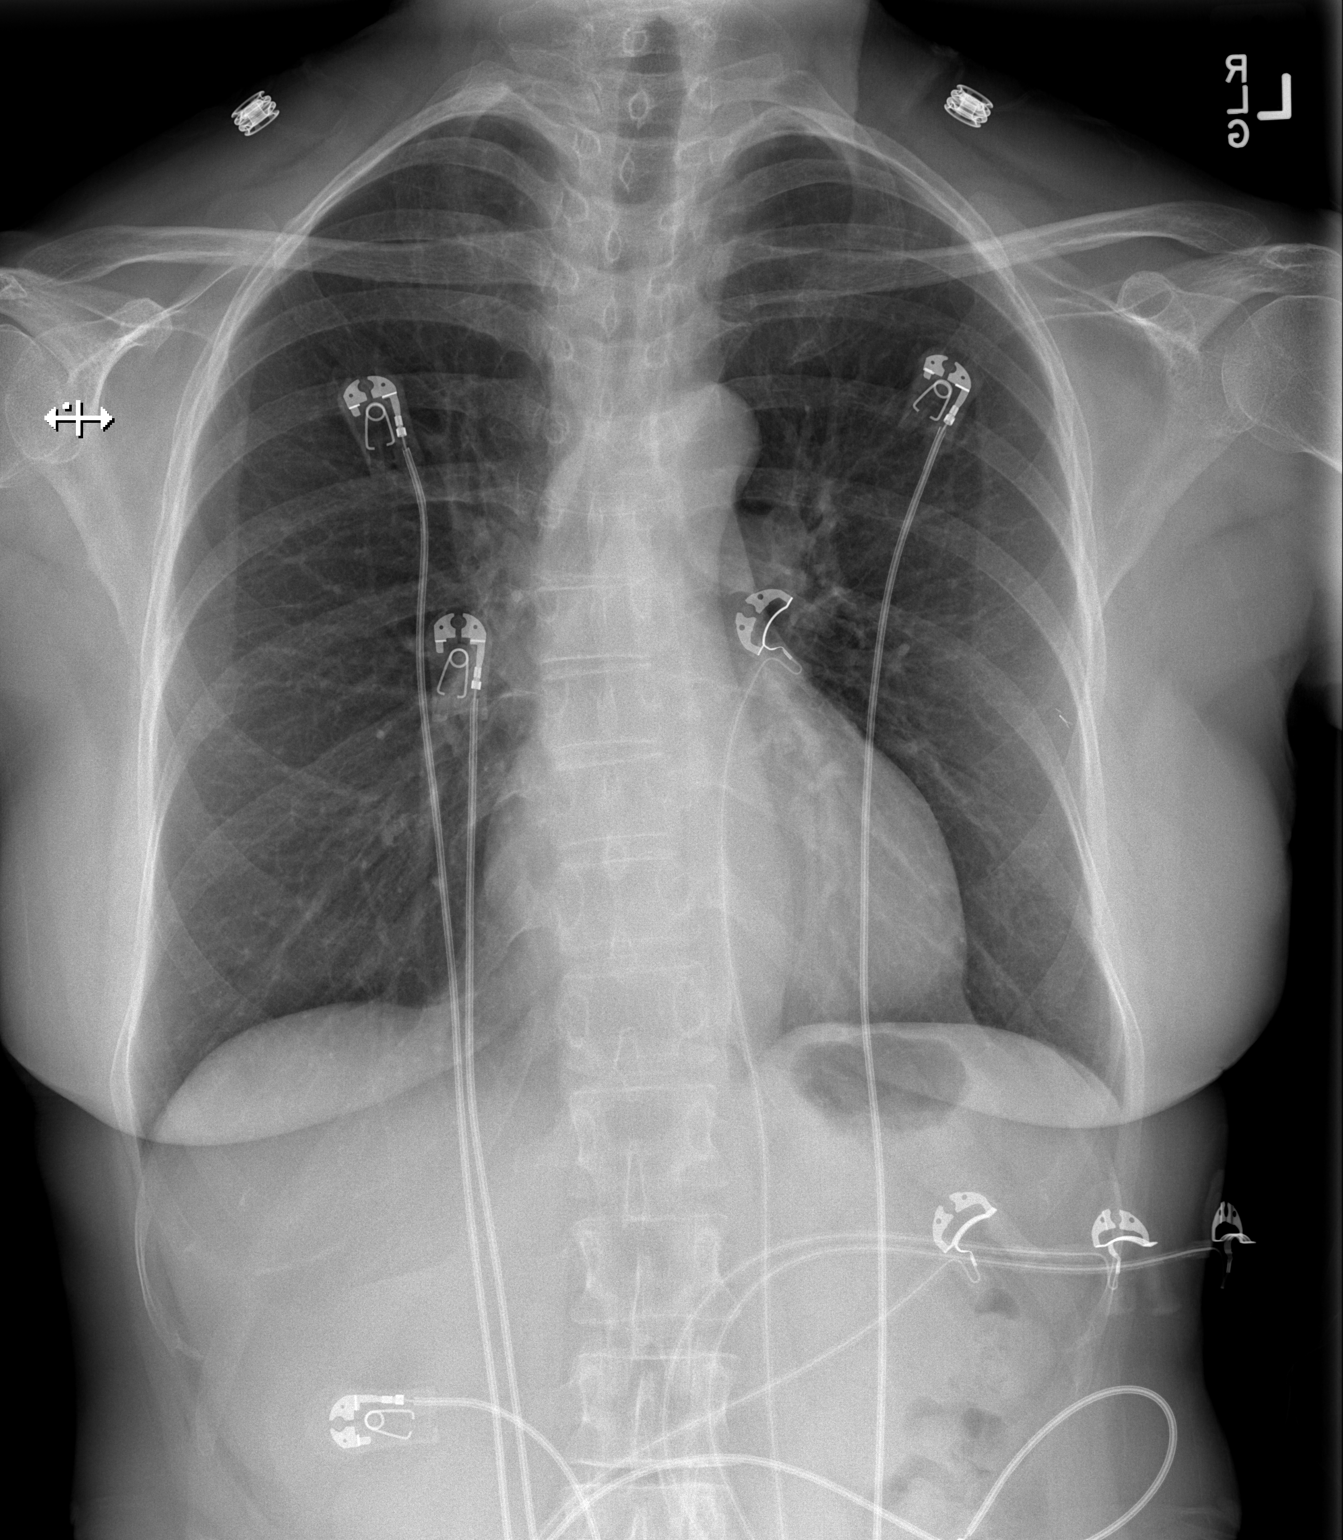

[w chest lat]
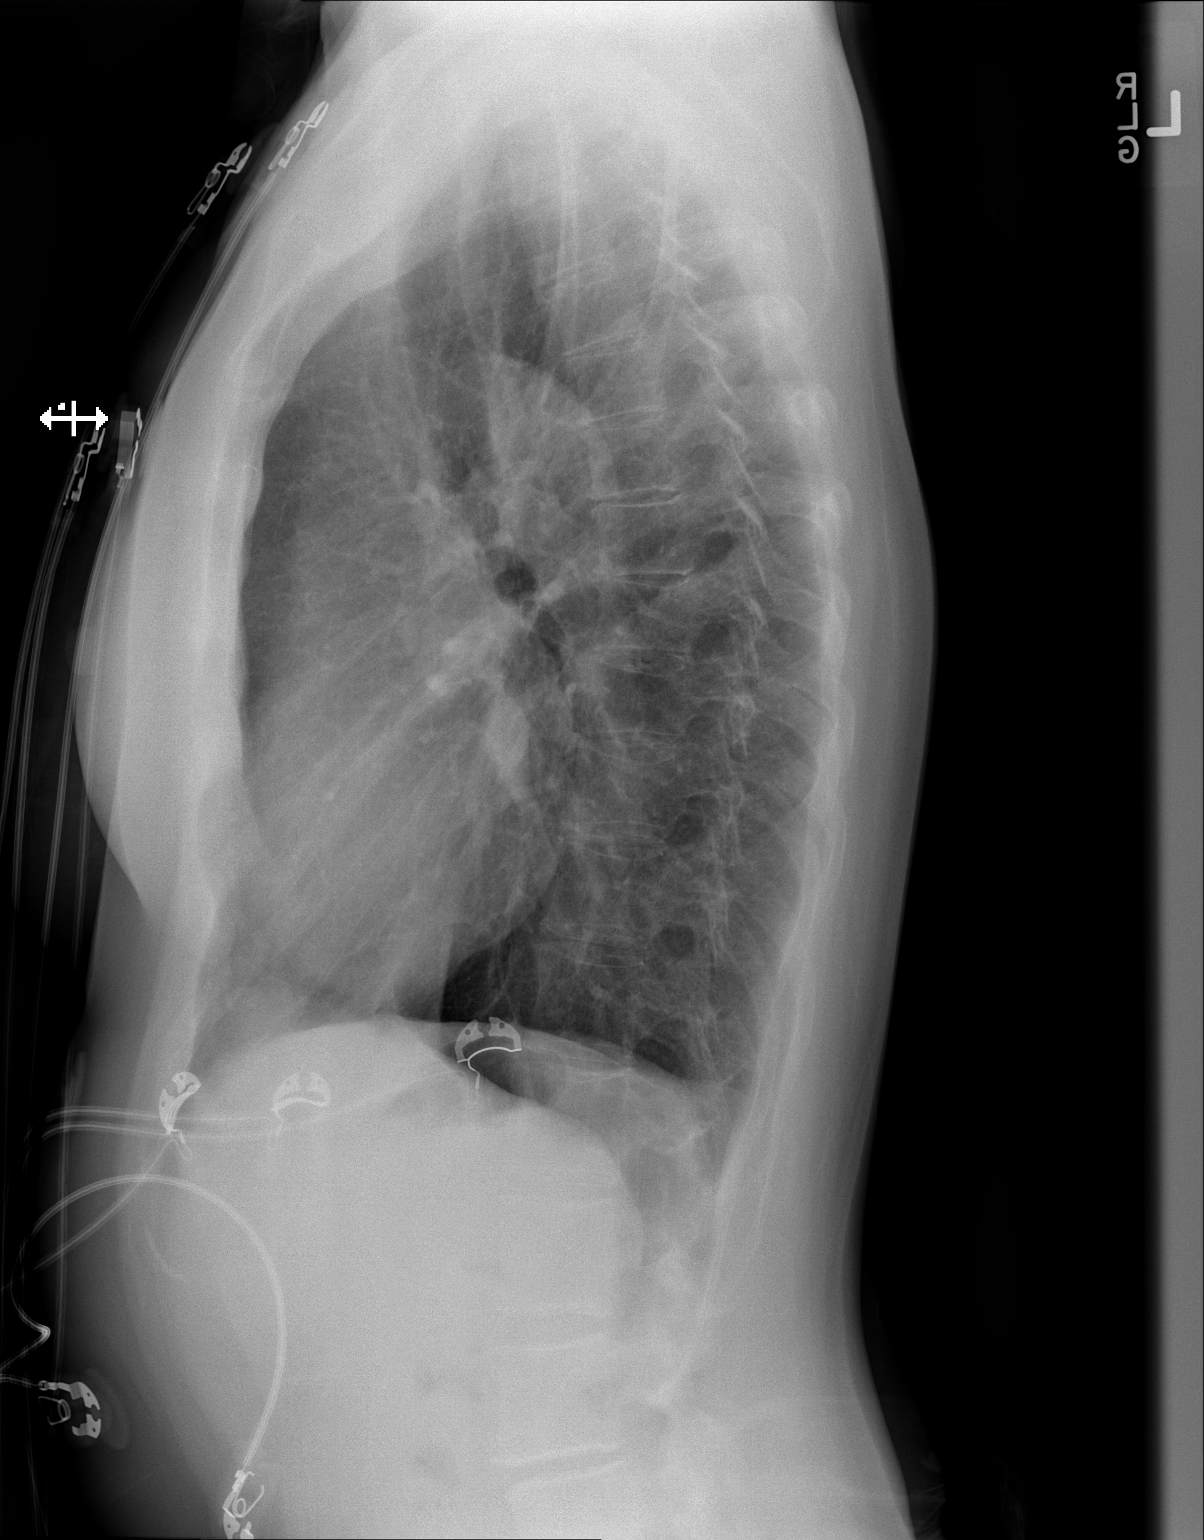

[2 of 2 positions shown; findings below may reference images not displayed]

FINDINGS: The lungs are hyperinflated with hemidiaphragm flattening. The heart
and pulmonary vascularity are normal. The mediastinum is normal in
width. There is no pleural effusion, pneumothorax, or
pneumomediastinum. There is gentle S-shaped thoracolumbar curvature.
IMPRESSION: Hyperinflation consistent with COPD or reactive airway disease. No
pneumonia, CHF, nor other acute cardiopulmonary abnormality

## 2018-05-10 ENCOUNTER — Encounter: Payer: Self-pay | Admitting: Internal Medicine

## 2018-05-14 ENCOUNTER — Other Ambulatory Visit: Payer: Self-pay | Admitting: Physician Assistant

## 2018-05-14 ENCOUNTER — Other Ambulatory Visit: Payer: Self-pay | Admitting: Internal Medicine

## 2018-05-14 DIAGNOSIS — M810 Age-related osteoporosis without current pathological fracture: Secondary | ICD-10-CM

## 2018-06-06 ENCOUNTER — Encounter: Payer: Self-pay | Admitting: Internal Medicine

## 2018-06-06 ENCOUNTER — Ambulatory Visit (INDEPENDENT_AMBULATORY_CARE_PROVIDER_SITE_OTHER): Payer: Medicare Other | Admitting: Internal Medicine

## 2018-06-06 VITALS — BP 96/60 | HR 72 | Temp 97.6°F | Resp 16 | Ht 61.0 in | Wt 114.0 lb

## 2018-06-06 DIAGNOSIS — Z136 Encounter for screening for cardiovascular disorders: Secondary | ICD-10-CM

## 2018-06-06 DIAGNOSIS — E039 Hypothyroidism, unspecified: Secondary | ICD-10-CM

## 2018-06-06 DIAGNOSIS — Z8249 Family history of ischemic heart disease and other diseases of the circulatory system: Secondary | ICD-10-CM

## 2018-06-06 DIAGNOSIS — Z1211 Encounter for screening for malignant neoplasm of colon: Secondary | ICD-10-CM

## 2018-06-06 DIAGNOSIS — R030 Elevated blood-pressure reading, without diagnosis of hypertension: Secondary | ICD-10-CM

## 2018-06-06 DIAGNOSIS — R7309 Other abnormal glucose: Secondary | ICD-10-CM

## 2018-06-06 DIAGNOSIS — E559 Vitamin D deficiency, unspecified: Secondary | ICD-10-CM | POA: Diagnosis not present

## 2018-06-06 DIAGNOSIS — I10 Essential (primary) hypertension: Secondary | ICD-10-CM | POA: Diagnosis not present

## 2018-06-06 DIAGNOSIS — Z1212 Encounter for screening for malignant neoplasm of rectum: Secondary | ICD-10-CM

## 2018-06-06 DIAGNOSIS — Z23 Encounter for immunization: Secondary | ICD-10-CM

## 2018-06-06 DIAGNOSIS — Z79899 Other long term (current) drug therapy: Secondary | ICD-10-CM | POA: Diagnosis not present

## 2018-06-06 DIAGNOSIS — K219 Gastro-esophageal reflux disease without esophagitis: Secondary | ICD-10-CM | POA: Diagnosis not present

## 2018-06-06 DIAGNOSIS — E782 Mixed hyperlipidemia: Secondary | ICD-10-CM

## 2018-06-06 DIAGNOSIS — R002 Palpitations: Secondary | ICD-10-CM | POA: Insufficient documentation

## 2018-06-06 DIAGNOSIS — Z0001 Encounter for general adult medical examination with abnormal findings: Secondary | ICD-10-CM

## 2018-06-06 MED ORDER — PANCRELIPASE (LIP-PROT-AMYL) 36000-114000 UNITS PO CPEP: 36000.0000 [IU] | ORAL_CAPSULE | Freq: Three times a day (TID) | ORAL | 3 refills | Status: DC

## 2018-06-06 NOTE — Progress Notes (Signed)
[facility] Unk Pinto, M.D.     Uvaldo Bristle. Silverio Lay, P.A.-C Liane Comber, [place] [address] [place], [place] [address] Telephone [phone] Telefax [phone] Comprehensive Evaluation &  Examination     This very nice 67 y.o. MWF presents for a comprehensive evaluation and management of multiple medical co-morbidities.  Patient has been followed for screening for labile HTN, HLD, screening for abn glucoses  and Vitamin D Deficiency. Patient has long standing many year hx/o abdominal bloating and had Cholecystectomy w/o improvement in her sx's.       Patient is followed expectantly for labile HTN. Patient's BP has been controlled at home and patient denies any cardiac symptoms as chest pain, palpitations, shortness of breath, dizziness or ankle swelling. Patient has had 2 negative Myoview tests in the last - last being about 2015. Today's BP is low at her baseline - 96/60      Patient's hyperlipidemia is controlled with diet and medications. Patient denies myalgias or other medication SE's. Last lipids were near goal: Lab Results  Component Value Date   CHOL 194 02/06/2018   HDL 78 02/06/2018   LDLCALC 102 (H) 02/06/2018   TRIG 58 02/06/2018   CHOLHDL 2.5 02/06/2018      Patient is monitored proactively for prediabetes and patient denies reactive hypoglycemic symptoms, visual blurring, diabetic polys or paresthesias. Last A1c was normal & at goal: Lab Results  Component Value Date   HGBA1C 5.3 10/29/2017      Patient has been on Thyroid Replacement since 2000.        Finally, patient has history of Vitamin D Deficiency ("25"/2017) and last Vitamin D was at goal: Lab Results  Component Value Date   VD25OH 62 10/29/2017   Current Outpatient Medications on File Prior to Visit  Medication Sig  . alendronate (FOSAMAX) 70 MG tablet TAKE 1 TABLET BY MOUTH EVERY WEEK 1ST IN THE MORNING ON AN EMPTY  STOMACH WITH ONLY A GLASS OF WATER NOTHING BY MOUTH FOR 1 HOUR  . amitriptyline (ELAVIL) 10 MG tablet Take 1 to 2 tablets at bedtime  . Ascorbic Acid (VITAMIN C) 1000 MG tablet Take 1,000 mg by mouth daily.  Marland Kitchen aspirin EC 81 MG tablet Take 81 mg by mouth daily.  . Calcium Citrate (CITRACAL PO) Take 1 tablet by mouth daily.  . Cholecalciferol (VITAMIN D-3) 5000 units TABS Take 1 tablet by mouth daily.  Marland Kitchen escitalopram (LEXAPRO) 20 MG tablet Take 20 mg by mouth daily.  . Flaxseed, Linseed, (FLAXSEED OIL) 1200 MG CAPS Take 1 capsule by mouth daily.  . fluticasone (FLONASE) 50 MCG/ACT nasal spray Place 1 spray into both nostrils daily as needed for allergies or rhinitis.  Marland Kitchen levothyroxine (SYNTHROID, LEVOTHROID) 50 MCG tablet TAKE 1 TABLET (50 MCG TOTAL) BY MOUTH DAILY BEFORE BREAKFAST.  Marland Kitchen levothyroxine (SYNTHROID, LEVOTHROID) 75 MCG tablet Take 75 mcg by mouth daily before breakfast. Patient alternates with 50 mcg tablet.  . Omega-3 Fatty Acids (FISH OIL) 1200 MG CAPS Take 1 capsule by mouth daily.  Marland Kitchen OVER THE COUNTER MEDICATION Patient takes "Strenght of the Hill" organic apple cider vinegar with added herbs and spices. 1-2 TBSP daily.  . Probiotic Product (PROBIOTIC-10) CAPS Take 1 capsule by mouth daily.  . rosuvastatin (CRESTOR) 40 MG tablet Take 1/2 to 1 tablet daily or as directed for Cholesterol   No current facility-administered medications on file prior to visit.    Allergies  Allergen Reactions  .  Ciprofloxacin Other (See Comments)    Alleged tendonitis   Past Medical History:  Diagnosis Date  . Anxiety   . Diverticular disease   . Hyperlipidemia, mixed   . Hypothyroidism 2000  . Vitamin D deficiency    Health Maintenance  Topic Date Due  . PAP SMEAR  06/03/1974  . COLONOSCOPY  06/04/2003  . INFLUENZA VACCINE  03/28/2018  . DEXA SCAN  06/03/2018  . PNA vac Low Risk Adult (1 of 2 - PCV13) 06/03/2018  . MAMMOGRAM  12/19/2018  . TETANUS/TDAP  08/28/2020  . Hepatitis C  Screening  Completed  . HIV Screening  Completed   Immunization History  Administered Date(s) Administered  . Influenza Inj Mdck Quad With Preservative 07/27/2017  . PPD Test 04/05/2017  . Tdap 08/28/2010   Last Colon - patient reports about 3 years ago in 2016 recommending a 10 yr f/u due 2026.   Last MGM - Recent at [facility]  Past Surgical History:  Procedure Laterality Date  . BREAST BIOPSY Left    benign  . CHOLECYSTECTOMY N/A 02/08/2017   Procedure: LAPAROSCOPIC CHOLECYSTECTOMY WITH INTRAOPERATIVE CHOLANGIOGRAM;  Surgeon: Donnie Mesa, MD;  Location: [facility];  Service: General;  Laterality: N/A;  . EYE SURGERY     Eyelid lifted  . TONSILLECTOMY     Family History  Problem Relation Age of Onset  . Heart disease Mother   . Heart disease Father   . Breast cancer Sister    Social History   Tobacco Use  . Smoking status: Never Smoker  . Smokeless tobacco: Never Used  Substance Use Topics  . Alcohol use: Yes    Comment: occasional   . Drug use: No    ROS Constitutional: Denies fever, chills, weight loss/gain, headaches, insomnia,  night sweats, and change in appetite. Does c/o fatigue. Eyes: Denies redness, blurred vision, diplopia, discharge, itchy, watery eyes.  ENT: Denies discharge, congestion, post nasal drip, epistaxis, sore throat, earache, hearing loss, dental pain, Tinnitus, Vertigo, Sinus pain, snoring.  Cardio: Denies chest pain, palpitations, irregular heartbeat, syncope, dyspnea, diaphoresis, orthopnea, PND, claudication, edema Respiratory: denies cough, dyspnea, DOE, pleurisy, hoarseness, laryngitis, wheezing.  Gastrointestinal: Denies dysphagia, heartburn, reflux, water brash, pain, cramps, nausea, vomiting, bloating, diarrhea, constipation, hematemesis, melena, hematochezia, jaundice, hemorrhoids Genitourinary: Denies dysuria, frequency, urgency, nocturia, hesitancy, discharge, hematuria, flank pain Breast: Breast lumps, nipple discharge,  bleeding.  Musculoskeletal: Denies arthralgia, myalgia, stiffness, Jt. Swelling, pain, limp, and strain/sprain. Denies falls. Skin: Denies puritis, rash, hives, warts, acne, eczema, changing in skin lesion Neuro: No weakness, tremor, incoordination, spasms, paresthesia, pain Psychiatric: Denies confusion, memory loss, sensory loss. Denies Depression. Endocrine: Denies change in weight, skin, hair change, nocturia, and paresthesia, diabetic polys, visual blurring, hyper / hypo glycemic episodes.  Heme/Lymph: No excessive bleeding, bruising, enlarged lymph nodes.  Physical Exam  BP 96/60   Pulse 72   Temp 97.6 F (36.4 C)   Resp 16   Ht 5\' 1"  (1.549 m)   Wt 114 lb (51.7 kg)   BMI 21.54 kg/m   General Appearance: Well nourished, well groomed and in no apparent distress.  Eyes: PERRLA, EOMs, conjunctiva no swelling or erythema, normal fundi and vessels. Sinuses: No frontal/maxillary tenderness ENT/Mouth: EACs patent / TMs  nl. Nares clear without erythema, swelling, mucoid exudates. Oral hygiene is good. No erythema, swelling, or exudate. Tongue normal, non-obstructing. Tonsils not swollen or erythematous. Hearing normal.  Neck: Supple, thyroid not palpable. No bruits, nodes or JVD. Respiratory: Respiratory effort normal.  BS  equal and clear bilateral without rales, rhonci, wheezing or stridor. Cardio: Heart sounds are normal with regular rate and rhythm and no murmurs, rubs or gallops. Peripheral pulses are normal and equal bilaterally without edema. No aortic or femoral bruits. Chest: symmetric with normal excursions and percussion. Breasts: Symmetric, without lumps, nipple discharge, retractions, or fibrocystic changes.  Abdomen: Flat, soft with bowel sounds active. Nontender, no guarding, rebound, hernias, masses, or organomegaly.  Lymphatics: Non tender without lymphadenopathy.  Musculoskeletal: Full ROM all peripheral extremities, joint stability, 5/5 strength, and normal  gait. Skin: Warm and dry without rashes, lesions, cyanosis, clubbing or  ecchymosis.  Neuro: Cranial nerves intact, reflexes equal bilaterally. Normal muscle tone, no cerebellar symptoms. Sensation intact.  Pysch: Alert and oriented X 3, normal affect, Insight and Judgment appropriate.   Assessment and Plan  1. Encounter for general adult medical examination with abnormal findings  2. Elevated BP without diagnosis of hypertension  - EKG 12-Lead - Urinalysis, Routine w reflex microscopic - Microalbumin / creatinine urine ratio - COMPLETE METABOLIC PANEL WITH GFR - Magnesium - TSH - CBC with Differential/Platelet  3. Hyperlipidemia, mixed  - EKG 12-Lead - Lipid panel - TSH  4. Abnormal glucose  - EKG 12-Lead - Hemoglobin A1c - Insulin, random  5. Vitamin D deficiency  - VITAMIN D 25 Hydroxyl  6. Hypothyroidism  - TSH  7. Gastroesophageal reflux disease  - CBC with Differential/Platelet  8. Screening for ischemic heart disease  - EKG 12-Lead  9. FHx: heart disease  - EKG 12-Lead  10. Screening for colorectal cancer  - Hemoccult  11. Medication management  - Urinalysis, Routine w reflex microscopic - Microalbumin / creatinine urine ratio - COMPLETE METABOLIC PANEL WITH GFR - Magnesium - Lipid panel - TSH - Hemoglobin A1c - Insulin, random - VITAMIN D 25 Hydroxyl  12. Need for prophylactic vaccination against Streptococcus pneumoniae (pneumococcus)  - Pneumococcal conjugate vaccine 13-valent         Patient was counseled in prudent diet to achieve/maintain BMI less than 25 for weight control, BP monitoring, regular exercise and medications. Discussed med's effects and SE's. Screening labs and tests as requested with regular follow-up as recommended. Over 40 minutes of exam, counseling, chart review and high complex critical decision making was performed.

## 2018-06-06 NOTE — Patient Instructions (Signed)

## 2018-06-07 LAB — LIPID PANEL
Cholesterol: 184 mg/dL (ref <200)
HDL: 77 mg/dL (ref >50)
LDL CHOLESTEROL (CALC): 93 mg/dL
Non-HDL Cholesterol (Calc): 107 mg/dL (calc) (ref <130)
TRIGLYCERIDES: 59 mg/dL (ref <150)
Total CHOL/HDL Ratio: 2.4 (calc) (ref <5.0)

## 2018-06-07 LAB — CBC WITH DIFFERENTIAL/PLATELET
BASOS ABS: 81 {cells}/uL (ref 0–200)
Basophils Relative: 1.5 %
EOS ABS: 59 {cells}/uL (ref 15–500)
Eosinophils Relative: 1.1 %
HCT: 39 % (ref 35.0–45.0)
Hemoglobin: 13.2 g/dL (ref 11.7–15.5)
Lymphs Abs: 1652 cells/uL (ref 850–3900)
MCH: 32 pg (ref 27.0–33.0)
MCHC: 33.8 g/dL (ref 32.0–36.0)
MCV: 94.4 fL (ref 80.0–100.0)
MONOS PCT: 6.5 %
MPV: 9.9 fL (ref 7.5–12.5)
NEUTROS PCT: 60.3 %
Neutro Abs: 3256 cells/uL (ref 1500–7800)
PLATELETS: 283 10*3/uL (ref 140–400)
RBC: 4.13 10*6/uL (ref 3.80–5.10)
RDW: 11.7 % (ref 11.0–15.0)
TOTAL LYMPHOCYTE: 30.6 %
WBC mixed population: 351 cells/uL (ref 200–950)
WBC: 5.4 10*3/uL (ref 3.8–10.8)

## 2018-06-07 LAB — URINALYSIS, ROUTINE W REFLEX MICROSCOPIC
BILIRUBIN URINE: NEGATIVE
Bacteria, UA: NONE SEEN /HPF
Glucose, UA: NEGATIVE
HGB URINE DIPSTICK: NEGATIVE
Hyaline Cast: NONE SEEN /LPF
KETONES UR: NEGATIVE
NITRITE: NEGATIVE
PROTEIN: NEGATIVE
RBC / HPF: NONE SEEN /HPF (ref 0–2)
SQUAMOUS EPITHELIAL / LPF: NONE SEEN /HPF (ref <=5)
Specific Gravity, Urine: 1.009 (ref 1.001–1.03)
pH: 5.5 (ref 5.0–8.0)

## 2018-06-07 LAB — COMPLETE METABOLIC PANEL WITH GFR
AG Ratio: 1.7 (calc) (ref 1.0–2.5)
ALBUMIN MSPROF: 4.3 g/dL (ref 3.6–5.1)
ALKALINE PHOSPHATASE (APISO): 51 U/L (ref 33–130)
ALT: 20 U/L (ref 6–29)
AST: 20 U/L (ref 10–35)
BUN: 19 mg/dL (ref 7–25)
CHLORIDE: 101 mmol/L (ref 98–110)
CO2: 31 mmol/L (ref 20–32)
Calcium: 9.7 mg/dL (ref 8.6–10.4)
Creat: 0.71 mg/dL (ref 0.50–0.99)
GFR, Est African American: 104 mL/min/{1.73_m2} (ref >=60)
GFR, Est Non African American: 89 mL/min/{1.73_m2} (ref >=60)
GLOBULIN: 2.5 g/dL (ref 1.9–3.7)
Glucose, Bld: 84 mg/dL (ref 65–99)
Potassium: 4.4 mmol/L (ref 3.5–5.3)
SODIUM: 138 mmol/L (ref 135–146)
Total Bilirubin: 0.5 mg/dL (ref 0.2–1.2)
Total Protein: 6.8 g/dL (ref 6.1–8.1)

## 2018-06-07 LAB — INSULIN, RANDOM: INSULIN: 2.4 u[IU]/mL (ref 2.0–19.6)

## 2018-06-07 LAB — MICROALBUMIN / CREATININE URINE RATIO
CREATININE, URINE: 35 mg/dL (ref 20–275)
Microalb Creat Ratio: 11 mcg/mg creat (ref <30)
Microalb, Ur: 0.4 mg/dL

## 2018-06-07 LAB — TSH: TSH: 4.74 m[IU]/L — AB (ref 0.40–4.50)

## 2018-06-07 LAB — HEMOGLOBIN A1C
Hgb A1c MFr Bld: 5.4 % of total Hgb (ref <5.7)
Mean Plasma Glucose: 108 (calc)
eAG (mmol/L): 6 (calc)

## 2018-06-07 LAB — MAGNESIUM: MAGNESIUM: 1.9 mg/dL (ref 1.5–2.5)

## 2018-06-07 LAB — VITAMIN D 25 HYDROXY (VIT D DEFICIENCY, FRACTURES): Vit D, 25-Hydroxy: 77 ng/mL (ref 30–100)

## 2018-06-09 ENCOUNTER — Encounter: Payer: Self-pay | Admitting: Internal Medicine

## 2018-06-10 ENCOUNTER — Other Ambulatory Visit: Payer: Self-pay | Admitting: Internal Medicine

## 2018-06-10 DIAGNOSIS — E039 Hypothyroidism, unspecified: Secondary | ICD-10-CM

## 2018-06-10 MED ORDER — LEVOTHYROXINE SODIUM 75 MCG PO TABS: ORAL_TABLET | ORAL | 3 refills | Status: DC

## 2018-07-09 ENCOUNTER — Ambulatory Visit: Payer: Self-pay | Admitting: Adult Health Nurse Practitioner

## 2018-07-09 ENCOUNTER — Ambulatory Visit: Payer: Self-pay

## 2018-07-16 ENCOUNTER — Ambulatory Visit (INDEPENDENT_AMBULATORY_CARE_PROVIDER_SITE_OTHER): Payer: Medicare Other | Admitting: Adult Health Nurse Practitioner

## 2018-07-16 ENCOUNTER — Encounter: Payer: Self-pay | Admitting: Adult Health Nurse Practitioner

## 2018-07-16 VITALS — BP 98/62 | HR 82 | Temp 97.7°F | Ht 61.0 in | Wt 114.0 lb

## 2018-07-16 DIAGNOSIS — F329 Major depressive disorder, single episode, unspecified: Secondary | ICD-10-CM | POA: Diagnosis not present

## 2018-07-16 DIAGNOSIS — I10 Essential (primary) hypertension: Secondary | ICD-10-CM

## 2018-07-16 DIAGNOSIS — K219 Gastro-esophageal reflux disease without esophagitis: Secondary | ICD-10-CM

## 2018-07-16 DIAGNOSIS — Z23 Encounter for immunization: Secondary | ICD-10-CM

## 2018-07-16 DIAGNOSIS — Z0000 Encounter for general adult medical examination without abnormal findings: Secondary | ICD-10-CM

## 2018-07-16 DIAGNOSIS — R6889 Other general symptoms and signs: Secondary | ICD-10-CM | POA: Diagnosis not present

## 2018-07-16 DIAGNOSIS — E039 Hypothyroidism, unspecified: Secondary | ICD-10-CM

## 2018-07-16 DIAGNOSIS — Z136 Encounter for screening for cardiovascular disorders: Secondary | ICD-10-CM | POA: Diagnosis not present

## 2018-07-16 DIAGNOSIS — R7309 Other abnormal glucose: Secondary | ICD-10-CM

## 2018-07-16 DIAGNOSIS — E782 Mixed hyperlipidemia: Secondary | ICD-10-CM | POA: Diagnosis not present

## 2018-07-16 DIAGNOSIS — Z0001 Encounter for general adult medical examination with abnormal findings: Secondary | ICD-10-CM | POA: Diagnosis not present

## 2018-07-16 DIAGNOSIS — F5101 Primary insomnia: Secondary | ICD-10-CM

## 2018-07-16 DIAGNOSIS — E559 Vitamin D deficiency, unspecified: Secondary | ICD-10-CM

## 2018-07-16 DIAGNOSIS — N390 Urinary tract infection, site not specified: Secondary | ICD-10-CM

## 2018-07-16 DIAGNOSIS — H9113 Presbycusis, bilateral: Secondary | ICD-10-CM

## 2018-07-16 DIAGNOSIS — K5730 Diverticulosis of large intestine without perforation or abscess without bleeding: Secondary | ICD-10-CM | POA: Diagnosis not present

## 2018-07-16 DIAGNOSIS — F32A Depression, unspecified: Secondary | ICD-10-CM

## 2018-07-16 DIAGNOSIS — R030 Elevated blood-pressure reading, without diagnosis of hypertension: Secondary | ICD-10-CM

## 2018-07-16 DIAGNOSIS — Z8249 Family history of ischemic heart disease and other diseases of the circulatory system: Secondary | ICD-10-CM

## 2018-07-16 DIAGNOSIS — M810 Age-related osteoporosis without current pathological fracture: Secondary | ICD-10-CM | POA: Diagnosis not present

## 2018-07-16 LAB — TSH: TSH: 0.82 mIU/L (ref 0.40–4.50)

## 2018-07-16 NOTE — Patient Instructions (Addendum)
To day you received your influenza HD vaccination  We are going to check your TSH lab today, we will call you with the results  Preventative care: Last colonoscopy: 2016 Last mammogram: 2019 Last pap smear/pelvic exam: 2017 DEXA, Bone Density: 2019  EKG: 2019 Hep C Screening 06/2017 Non-reactive  Prior vaccinations: TD or Tdap: 2012, Due 2022  Influenza: Received today 07/16/18  Pneumococcal: Due 05/2019 Prevnar13: 05/2018 Shingles/Zostavax: Declined, may receive at local pharmacy  Names of Other Physician/Practitioners you currently use: 1.  [facility] here for primary care 2. Eye Exam 2019 3.Dentist: 2019, Q7month  Patient Care Team: MUnk Pinto MD as PCP - General (Internal Medicine)   Preventive Care for Adults  A healthy lifestyle and preventive care can promote health and wellness. Preventive health guidelines for women include the following key practices.  A routine yearly physical is a good way to check with your health care provider about your health and preventive screening. It is a chance to share any concerns and updates on your health and to receive a thorough exam.  Visit your dentist for a routine exam and preventive care every 6 months. Brush your teeth twice a day and floss once a day. Good oral hygiene prevents tooth decay and gum disease.  The frequency of eye exams is based on your age, health, family medical history, use of contact lenses, and other factors. Follow your health care provider's recommendations for frequency of eye exams.  Eat a healthy diet. Foods like vegetables, fruits, whole grains, low-fat dairy products, and lean protein foods contain the nutrients you need without too many calories. Decrease your intake of foods high in solid fats, added sugars, and salt. Eat the right amount of calories for you. Get information about a proper diet from your health care provider, if necessary.  Regular  physical exercise is one of the most important things you can do for your health. Most adults should get at least 150 minutes of moderate-intensity exercise (any activity that increases your heart rate and causes you to sweat) each week. In addition, most adults need muscle-strengthening exercises on 2 or more days a week.  Maintain a healthy weight. The body mass index (BMI) is a screening tool to identify possible weight problems. It provides an estimate of body fat based on height and weight. Your health care provider can find your BMI and can help you achieve or maintain a healthy weight. For adults 20 years and older:  A BMI below 18.5 is considered underweight.  A BMI of 18.5 to 24.9 is normal.  A BMI of 25 to 29.9 is considered overweight.  A BMI of 30 and above is considered obese.  Maintain normal blood lipids and cholesterol levels by exercising and minimizing your intake of saturated fat. Eat a balanced diet with plenty of fruit and vegetables. Blood tests for lipids and cholesterol should begin at age 4378and be repeated every 5 years. If your lipid or cholesterol levels are high, you are over 50, or you are at high risk for heart disease, you may need your cholesterol levels checked more frequently. Ongoing high lipid and cholesterol levels should be treated with medicines if diet and exercise are not working.  If you smoke, find out from your health care provider how to quit. If you do not use tobacco, do not start.  Lung cancer screening is recommended for adults aged 578-80years who are at high risk for developing lung cancer because of  a history of smoking. A yearly low-dose CT scan of the lungs is recommended for people who have at least a 30-pack-year history of smoking and are a current smoker or have quit within the past 15 years. A pack year of smoking is smoking an average of 1 pack of cigarettes a day for 1 year (for example: 1 pack a day for 30 years or 2 packs a day for 15  years). Yearly screening should continue until the smoker has stopped smoking for at least 15 years. Yearly screening should be stopped for people who develop a health problem that would prevent them from having lung cancer treatment.  High blood pressure causes heart disease and increases the risk of stroke. Your blood pressure should be checked at least every 1 to 2 years. Ongoing high blood pressure should be treated with medicines if weight loss and exercise do not work.  If you are 66-74 years old, ask your health care provider if you should take aspirin to prevent strokes.  Diabetes screening involves taking a blood sample to check your fasting blood sugar level. This should be done once every 3 years, after age 13, if you are within normal weight and without risk factors for diabetes. Testing should be considered at a younger age or be carried out more frequently if you are overweight and have at least 1 risk factor for diabetes.  Breast cancer screening is essential preventive care for women. You should practice "breast self-awareness." This means understanding the normal appearance and feel of your breasts and may include breast self-examination. Any changes detected, no matter how small, should be reported to a health care provider. Women in their 81s and 30s should have a clinical breast exam (CBE) by a health care provider as part of a regular health exam every 1 to 3 years. After age 64, women should have a CBE every year. Starting at age 110, women should consider having a mammogram (breast X-ray test) every year. Women who have a family history of breast cancer should talk to their health care provider about genetic screening. Women at a high risk of breast cancer should talk to their health care providers about having an MRI and a mammogram every year.  Breast cancer gene (BRCA)-related cancer risk assessment is recommended for women who have family members with BRCA-related cancers.  BRCA-related cancers include breast, ovarian, tubal, and peritoneal cancers. Having family members with these cancers may be associated with an increased risk for harmful changes (mutations) in the breast cancer genes BRCA1 and BRCA2. Results of the assessment will determine the need for genetic counseling and BRCA1 and BRCA2 testing.  Routine pelvic exams to screen for cancer are no longer recommended for nonpregnant women who are considered low risk for cancer of the pelvic organs (ovaries, uterus, and vagina) and who do not have symptoms. Ask your health care provider if a screening pelvic exam is right for you.  If you have had past treatment for cervical cancer or a condition that could lead to cancer, you need Pap tests and screening for cancer for at least 20 years after your treatment. If Pap tests have been discontinued, your risk factors (such as having a new sexual partner) need to be reassessed to determine if screening should be resumed. Some women have medical problems that increase the chance of getting cervical cancer. In these cases, your health care provider may recommend more frequent screening and Pap tests.  Colorectal cancer can be detected and often  prevented. Most routine colorectal cancer screening begins at the age of 37 years and continues through age 40 years. However, your health care provider may recommend screening at an earlier age if you have risk factors for colon cancer. On a yearly basis, your health care provider may provide home test kits to check for hidden blood in the stool. Use of a small camera at the end of a tube, to directly examine the colon (sigmoidoscopy or colonoscopy), can detect the earliest forms of colorectal cancer. Talk to your health care provider about this at age 54, when routine screening begins.  Direct exam of the colon should be repeated every 5-10 years through age 7 years, unless early forms of pre-cancerous polyps or small growths are  found.  Hepatitis C blood testing is recommended for all people born from [birth year] through 1965 and any individual with known risks for hepatitis C.  Pra  Osteoporosis is a disease in which the bones lose minerals and strength with aging. This can result in serious bone fractures or breaks. The risk of osteoporosis can be identified using a bone density scan. Women ages 87 years and over and women at risk for fractures or osteoporosis should discuss screening with their health care providers. Ask your health care provider whether you should take a calcium supplement or vitamin D to reduce the rate of osteoporosis.  Menopause can be associated with physical symptoms and risks. Hormone replacement therapy is available to decrease symptoms and risks. You should talk to your health care provider about whether hormone replacement therapy is right for you.  Use sunscreen. Apply sunscreen liberally and repeatedly throughout the day. You should seek shade when your shadow is shorter than you. Protect yourself by wearing long sleeves, pants, a wide-brimmed hat, and sunglasses year round, whenever you are outdoors.  Once a month, do a whole body skin exam, using a mirror to look at the skin on your back. Tell your health care provider of new moles, moles that have irregular borders, moles that are larger than a pencil eraser, or moles that have changed in shape or color.  Stay current with required vaccines (immunizations).  Influenza vaccine. All adults should be immunized every year.  Tetanus, diphtheria, and acellular pertussis (Td, Tdap) vaccine. Pregnant women should receive 1 dose of Tdap vaccine during each pregnancy. The dose should be obtained regardless of the length of time since the last dose. Immunization is preferred during the 27th-36th week of gestation. An adult who has not previously received Tdap or who does not know her vaccine status should receive 1 dose of Tdap. This initial dose should be  followed by tetanus and diphtheria toxoids (Td) booster doses every 10 years. Adults with an unknown or incomplete history of completing a 3-dose immunization series with Td-containing vaccines should begin or complete a primary immunization series including a Tdap dose. Adults should receive a Td booster every 10 years.  Varicella vaccine. An adult without evidence of immunity to varicella should receive 2 doses or a second dose if she has previously received 1 dose. Pregnant females who do not have evidence of immunity should receive the first dose after pregnancy. This first dose should be obtained before leaving the health care facility. The second dose should be obtained 4-8 weeks after the first dose.  Human papillomavirus (HPV) vaccine. Females aged 13-26 years who have not received the vaccine previously should obtain the 3-dose series. The vaccine is not recommended for use in pregnant females. However,  pregnancy testing is not needed before receiving a dose. If a female is found to be pregnant after receiving a dose, no treatment is needed. In that case, the remaining doses should be delayed until after the pregnancy. Immunization is recommended for any person with an immunocompromised condition through the age of 92 years if she did not get any or all doses earlier. During the 3-dose series, the second dose should be obtained 4-8 weeks after the first dose. The third dose should be obtained 24 weeks after the first dose and 16 weeks after the second dose.  Zoster vaccine. One dose is recommended for adults aged 80 years or older unless certain conditions are present.  Measles, mumps, and rubella (MMR) vaccine. Adults born before [birth year] generally are considered immune to measles and mumps. Adults born in [birth year] or later should have 1 or more doses of MMR vaccine unless there is a contraindication to the vaccine or there is laboratory evidence of immunity to each of the three diseases. A routine second  dose of MMR vaccine should be obtained at least 28 days after the first dose for students attending postsecondary schools, health care workers, or international travelers. People who received inactivated measles vaccine or an unknown type of measles vaccine during 1963-1967 should receive 2 doses of MMR vaccine. People who received inactivated mumps vaccine or an unknown type of mumps vaccine before 1979 and are at high risk for mumps infection should consider immunization with 2 doses of MMR vaccine. For females of childbearing age, rubella immunity should be determined. If there is no evidence of immunity, females who are not pregnant should be vaccinated. If there is no evidence of immunity, females who are pregnant should delay immunization until after pregnancy. Unvaccinated health care workers born before [birth year] who lack laboratory evidence of measles, mumps, or rubella immunity or laboratory confirmation of disease should consider measles and mumps immunization with 2 doses of MMR vaccine or rubella immunization with 1 dose of MMR vaccine.  Pneumococcal 13-valent conjugate (PCV13) vaccine. When indicated, a person who is uncertain of her immunization history and has no record of immunization should receive the PCV13 vaccine. An adult aged 11 years or older who has certain medical conditions and has not been previously immunized should receive 1 dose of PCV13 vaccine. This PCV13 should be followed with a dose of pneumococcal polysaccharide (PPSV23) vaccine. The PPSV23 vaccine dose should be obtained at least 1 or more year(s) after the dose of PCV13 vaccine. An adult aged 30 years or older who has certain medical conditions and previously received 1 or more doses of PPSV23 vaccine should receive 1 dose of PCV13. The PCV13 vaccine dose should be obtained 1 or more years after the last PPSV23 vaccine dose.    Pneumococcal polysaccharide (PPSV23) vaccine. When PCV13 is also indicated, PCV13 should be obtained  first. All adults aged 59 years and older should be immunized. An adult younger than age 70 years who has certain medical conditions should be immunized. Any person who resides in a nursing home or long-term care facility should be immunized. An adult smoker should be immunized. People with an immunocompromised condition and certain other conditions should receive both PCV13 and PPSV23 vaccines. People with human immunodeficiency virus (HIV) infection should be immunized as soon as possible after diagnosis. Immunization during chemotherapy or radiation therapy should be avoided. Routine use of PPSV23 vaccine is not recommended for American Indians, [place] Natives, or people younger than 65 years unless there are medical  conditions that require PPSV23 vaccine. When indicated, people who have unknown immunization and have no record of immunization should receive PPSV23 vaccine. One-time revaccination 5 years after the first dose of PPSV23 is recommended for people aged 19-64 years who have chronic kidney failure, nephrotic syndrome, asplenia, or immunocompromised conditions. People who received 1-2 doses of PPSV23 before age 91 years should receive another dose of PPSV23 vaccine at age 51 years or later if at least 5 years have passed since the previous dose. Doses of PPSV23 are not needed for people immunized with PPSV23 at or after age 79 years.  Preventive Services / Frequency   Ages 93 to 70 years  Blood pressure check.  Lipid and cholesterol check.  Lung cancer screening. / Every year if you are aged 82-80 years and have a 30-pack-year history of smoking and currently smoke or have quit within the past 15 years. Yearly screening is stopped once you have quit smoking for at least 15 years or develop a health problem that would prevent you from having lung cancer treatment.  Clinical breast exam.** / Every year after age 85 years.   BRCA-related cancer risk assessment.** / For women who have family  members with a BRCA-related cancer (breast, ovarian, tubal, or peritoneal cancers).  Mammogram.** / Every year beginning at age 26 years and continuing for as long as you are in good health. Consult with your health care provider.  Pap test.** / Every 3 years starting at age 16 years through age 52 or 50 years with a history of 3 consecutive normal Pap tests.  HPV screening.** / Every 3 years from ages 18 years through ages 73 to 59 years with a history of 3 consecutive normal Pap tests.  Fecal occult blood test (FOBT) of stool. / Every year beginning at age 62 years and continuing until age 48 years. You may not need to do this test if you get a colonoscopy every 10 years.  Flexible sigmoidoscopy or colonoscopy.** / Every 5 years for a flexible sigmoidoscopy or every 10 years for a colonoscopy beginning at age 52 years and continuing until age 38 years.  Hepatitis C blood test.** / For all people born from [birth year] through 1963 and any individual with known risks for hepatitis C.  Skin self-exam. / Monthly.  Influenza vaccine. / Every year.  Tetanus, diphtheria, and acellular pertussis (Tdap/Td) vaccine.** / Consult your health care provider. Pregnant women should receive 1 dose of Tdap vaccine during each pregnancy. 1 dose of Td every 10 years.  Varicella vaccine.** / Consult your health care provider. Pregnant females who do not have evidence of immunity should receive the first dose after pregnancy.  Zoster vaccine.** / 1 dose for adults aged 73 years or older.  Pneumococcal 13-valent conjugate (PCV13) vaccine.** / Consult your health care provider.  Pneumococcal polysaccharide (PPSV23) vaccine.** / 1 to 2 doses if you smoke cigarettes or if you have certain conditions.  Meningococcal vaccine.** / Consult your health care provider.  Hepatitis A vaccine.** / Consult your health care provider.  Hepatitis B vaccine.** / Consult your health care provider. Screening for abdominal aortic  aneurysm (AAA)  by ultrasound is recommended for people over 50 who have history of high blood pressure or who are current or former smokers. ++++++++++++++++++ Recommend Adult Low Dose Aspirin or  coated  Aspirin 81 mg daily  To reduce risk of Colon Cancer 20 %,  Skin Cancer 26 % ,  Melanoma 46%  and  Pancreatic cancer 60% +++++++++++++++++++  Vitamin D goal  is between 70-100.  Please make sure that you are taking your Vitamin D as directed.  It is very important as a natural anti-inflammatory  helping hair, skin, and nails, as well as reducing stroke and heart attack risk.  It helps your bones and helps with mood. It also decreases numerous cancer risks so please take it as directed.  Low Vit D is associated with a 200-300% higher risk for CANCER  and 200-300% higher risk for HEART   ATTACK  &  STROKE.   .....................................Marland Kitchen It is also associated with higher death rate at younger ages,  autoimmune diseases like Rheumatoid arthritis, Lupus, Multiple Sclerosis.    Also many other serious conditions, like depression, Alzheimer's Dementia, infertility, muscle aches, fatigue, fibromyalgia - just to name a few. ++++++++++++++++++ Recommend the book "The END of DIETING" by Dr Excell Seltzer  & the book "The END of DIABETES " by Dr Excell Seltzer At [email or web address] - get book & Audio CD's    Being diabetic has a  300% increased risk for heart attack, stroke, cancer, and alzheimer- type vascular dementia. It is very important that you work harder with diet by avoiding all foods that are white. Avoid white rice (brown & wild rice is OK), white potatoes (sweetpotatoes in moderation is OK), White bread or wheat bread or anything made out of white flour like bagels, donuts, rolls, buns, biscuits, cakes, pastries, cookies, pizza crust, and pasta (made from white flour & egg whites) - vegetarian pasta or spinach or wheat pasta is OK. Multigrain breads like Arnold's or [employer], or  multigrain sandwich thins or flatbreads.  Diet, exercise and weight loss can reverse and cure diabetes in the early stages.  Diet, exercise and weight loss is very important in the control and prevention of complications of diabetes which affects every system in your body, ie. Brain - dementia/stroke, eyes - glaucoma/blindness, heart - heart attack/heart failure, kidneys - dialysis, stomach - gastric paralysis, intestines - malabsorption, nerves - severe painful neuritis, circulation - gangrene & loss of a leg(s), and finally cancer and Alzheimers.    I recommend avoid fried & greasy foods,  sweets/candy, white rice (brown or wild rice or Quinoa is OK), white potatoes (sweet potatoes are OK) - anything made from white flour - bagels, doughnuts, rolls, buns, biscuits,white and wheat breads, pizza crust and traditional pasta made of white flour & egg white(vegetarian pasta or spinach or wheat pasta is OK).  Multi-grain bread is OK - like multi-grain flat bread or sandwich thins. Avoid alcohol in excess. Exercise is also important.    Eat all the vegetables you want - avoid meat, especially red meat and dairy - especially cheese.  Cheese is the most concentrated form of trans-fats which is the worst thing to clog up our arteries. Veggie cheese is OK which can be found in the fresh produce section at [employer] or [employer] or [employer]  ++++++++++++++++++++++ DASH Eating Plan  DASH stands for "Dietary Approaches to Stop Hypertension."   The DASH eating plan is a healthy eating plan that has been shown to reduce high blood pressure (hypertension). Additional health benefits may include reducing the risk of type 2 diabetes mellitus, heart disease, and stroke. The DASH eating plan may also help with weight loss. WHAT DO I NEED TO KNOW ABOUT THE DASH EATING PLAN? For the DASH eating plan, you will follow these general guidelines:  Choose foods with a percent daily value for sodium of less  than 5% (as  listed on the food label).  Use salt-free seasonings or herbs instead of table salt or sea salt.  Check with your health care provider or pharmacist before using salt substitutes.  Eat lower-sodium products, often labeled as "lower sodium" or "no salt added."  Eat fresh foods.  Eat more vegetables, fruits, and low-fat dairy products.  Choose whole grains. Look for the word "whole" as the first word in the ingredient list.  Choose fish   Limit sweets, desserts, sugars, and sugary drinks.  Choose heart-healthy fats.  Eat veggie cheese   Eat more home-cooked food and less restaurant, buffet, and fast food.  Limit fried foods.  Cook foods using methods other than frying.  Limit canned vegetables. If you do use them, rinse them well to decrease the sodium.  When eating at a restaurant, ask that your food be prepared with less salt, or no salt if possible.                      WHAT FOODS CAN I EAT? Read Dr Fara Olden Fuhrman's books on The End of Dieting & The End of Diabetes  Grains Whole grain or whole wheat bread. Brown rice. Whole grain or whole wheat pasta. Quinoa, bulgur, and whole grain cereals. Low-sodium cereals. Corn or whole wheat flour tortillas. Whole grain cornbread. Whole grain crackers. Low-sodium crackers.  Vegetables Fresh or frozen vegetables (raw, steamed, roasted, or grilled). Low-sodium or reduced-sodium tomato and vegetable juices. Low-sodium or reduced-sodium tomato sauce and paste. Low-sodium or reduced-sodium canned vegetables.   Fruits All fresh, canned (in natural juice), or frozen fruits.  Protein Products  All fish and seafood.  Dried beans, peas, or lentils. Unsalted nuts and seeds. Unsalted canned beans.  Dairy Low-fat dairy products, such as skim or 1% milk, 2% or reduced-fat cheeses, low-fat ricotta or cottage cheese, or plain low-fat yogurt. Low-sodium or reduced-sodium cheeses.  Fats and Oils Tub margarines without trans fats. Light or  reduced-fat mayonnaise and salad dressings (reduced sodium). Avocado. Safflower, olive, or canola oils. Natural peanut or almond butter.  Other Unsalted popcorn and pretzels. The items listed above may not be a complete list of recommended foods or beverages. Contact your dietitian for more options.  ++++++++++++++++++  WHAT FOODS ARE NOT RECOMMENDED? Grains/ White flour or wheat flour White bread. White pasta. White rice. Refined cornbread. Bagels and croissants. Crackers that contain trans fat.  Vegetables  Creamed or fried vegetables. Vegetables in a . Regular canned vegetables. Regular canned tomato sauce and paste. Regular tomato and vegetable juices.  Fruits Dried fruits. Canned fruit in light or heavy syrup. Fruit juice.  Meat and Other Protein Products Meat in general - RED meat & White meat.  Fatty cuts of meat. Ribs, chicken wings, all processed meats as bacon, sausage, bologna, salami, fatback, hot dogs, bratwurst and packaged luncheon meats.  Dairy Whole or 2% milk, cream, half-and-half, and cream cheese. Whole-fat or sweetened yogurt. Full-fat cheeses or blue cheese. Non-dairy creamers and whipped toppings. Processed cheese, cheese spreads, or cheese curds.  Condiments Onion and garlic salt, seasoned salt, table salt, and sea salt. Canned and packaged gravies. Worcestershire sauce. Tartar sauce. Barbecue sauce. Teriyaki sauce. Soy sauce, including reduced sodium. Steak sauce. Fish sauce. Oyster sauce. Cocktail sauce. Horseradish. Ketchup and mustard. Meat flavorings and tenderizers. Bouillon cubes. Hot sauce. Tabasco sauce. Marinades. Taco seasonings. Relishes.  Fats and Oils Butter, stick margarine, lard, shortening and bacon fat. Coconut, palm kernel, or palm oils. Regular  salad dressings.  Pickles and olives. Salted popcorn and pretzels.  The items listed above may not be a complete list of foods and beverages to avoid.

## 2018-07-16 NOTE — Progress Notes (Signed)
MEDICARE ANNUAL WELLNESS VISIT AND CPE  Assessment:   [name] was seen today for medicare wellness.  Diagnoses and all orders for this visit:  Hypothyroidism, unspecified type -     TSH, will check today Will contact with results  Diverticulosis of colon without hemorrhage Doing well on current regiment Continue probiotic  Gastroesophageal reflux disease, esophagitis presence not specified Doing well on current regiment, continue wth benefit  Recurrent UTI Discussed increasing water intake  Hyperlipidemia, mixed Continue on Crestor 40mg  At goal last visit Will recheck at next follow up.  Depression, unspecified depression type Doing well on current regiment Continue with beenfit PQ2, negative  FHx: heart disease AAA screening in office  Vitamin D deficiency Continue supplementation  Presbycusis of both ears Discussed free hearing examination at [facility] Deferred by patient at this time Will consider next follow up  Osteoporosis, unspecified UTD with DEXA Continue Fosamax, doing well with this Teach back provided regarding proper medication administration Continue Calcium & Vit D supplements  Insomnia, primary Taking amitriptyline 10mg  night Doing well with this continue with benefit  Abnormal glucose Discussed disease progression and risks Discussed diet/exercise, weight management and risk modification  Elevated BP without diagnosis of hypertension -     EKG 12-Lead  Screening for AAA (aortic abdominal aneurysm) Abdominal ultrasound preformed today Less than 3  Screening for cardiovascular condition -     EKG 12-Lead  Need for influenza vaccination -     Flu vaccine HIGH DOSE PF    Over 40 minutes of exam, counseling, chart review and critical decision making was performed Future Appointments  Date Time Provider [place]  12/12/2018  9:30 AM Liane Comber, NP [facility] None  07/02/2019  2:00 PM Unk Pinto, MD [facility] None   07/22/2019  2:00 PM [name], NP [facility] None     Plan:   During the course of the visit the patient was educated and counseled about appropriate screening and preventive services including:    Pneumococcal vaccine   Prevnar 13  Influenza vaccine  Td vaccine  Screening electrocardiogram  Bone densitometry screening  Colorectal cancer screening  Diabetes screening  Glaucoma screening  Nutrition counseling   Advanced directives: requested   Subjective:  Isabel Tyler is a 67 y.o. female who presents for Medicare Annual Wellness Visit and complete physical.  She has hypothyroidism, diverticulosis, GERD, Recurrent UTI, Hyperlipidemia, depression, heart disease, Vit D effciency abnormal glucose.  Last visit her TSH was 7.54 and levothyroxine was increased to 22mcg.  She reports she is doing well and denies any adverse side effects.  Reports overall she is doing well.   Her blood pressure has been controlled at home, today their BP is BP: 98/62 She does workout. She denies chest pain, shortness of breath, dizziness.  She is on cholesterol medication and denies myalgias. Her cholesterol is at goal. The cholesterol last visit was:   Lab Results  Component Value Date   CHOL 184 06/06/2018   HDL 77 06/06/2018   LDLCALC 93 06/06/2018   TRIG 59 06/06/2018   CHOLHDL 2.4 06/06/2018    Last A1C in the office was:  Lab Results  Component Value Date   HGBA1C 5.4 06/06/2018   Last GFR:   Lab Results  Component Value Date   GFRNONAA 89 06/06/2018   Lab Results  Component Value Date   GFRAA 104 06/06/2018   Patient is on Vitamin D supplement.   Lab Results  Component Value Date   VD25OH 77 06/06/2018  Medication Review: Current Outpatient Medications on File Prior to Visit  Medication Sig Dispense Refill  . alendronate (FOSAMAX) 70 MG tablet TAKE 1 TABLET BY MOUTH EVERY WEEK 1ST IN THE MORNING ON AN EMPTY STOMACH WITH ONLY A GLASS OF WATER NOTHING  BY MOUTH FOR 1 HOUR 12 tablet 0  . amitriptyline (ELAVIL) 10 MG tablet Take 1 to 2 tablets at bedtime 180 tablet 1  . Ascorbic Acid (VITAMIN C) 1000 MG tablet Take 1,000 mg by mouth daily.    Marland Kitchen aspirin EC 81 MG tablet Take 81 mg by mouth daily.    . Calcium Citrate (CITRACAL PO) Take 1 tablet by mouth daily.    . Cholecalciferol (VITAMIN D-3) 5000 units TABS Take 1 tablet by mouth daily.    Marland Kitchen escitalopram (LEXAPRO) 20 MG tablet Take 20 mg by mouth daily.    . Flaxseed, Linseed, (FLAXSEED OIL) 1200 MG CAPS Take 1 capsule by mouth daily.    . fluticasone (FLONASE) 50 MCG/ACT nasal spray Place 1 spray into both nostrils daily as needed for allergies or rhinitis.    Marland Kitchen levothyroxine (SYNTHROID, LEVOTHROID) 75 MCG tablet Take 1 tablet daily with only water on an empty stomach for 30 minutes and no antacids for 4 hours 90 tablet 3  . lipase/protease/amylase (CREON) 36000 UNITS CPEP capsule Take 1 capsule (36,000 Units total) by mouth 3 (three) times daily before meals. 90 capsule 3  . Omega-3 Fatty Acids (FISH OIL) 1200 MG CAPS Take 1 capsule by mouth daily.    Marland Kitchen OVER THE COUNTER MEDICATION Patient takes "[place]" organic apple cider vinegar with added herbs and spices. 1-2 TBSP daily.    . Probiotic Product (PROBIOTIC-10) CAPS Take 1 capsule by mouth daily.    . rosuvastatin (CRESTOR) 40 MG tablet Take 1/2 to 1 tablet daily or as directed for Cholesterol 30 tablet 5   No current facility-administered medications on file prior to visit.     Allergies  Allergen Reactions  . Ciprofloxacin Other (See Comments)    Alleged tendonitis    Current Problems (verified) Patient Active Problem List   Diagnosis Date Noted  . Presbycusis of both ears 07/16/2018  . Need for influenza vaccination 07/16/2018  . Screening for cardiovascular condition 06/06/2018  . FHx: heart disease 06/06/2018  . Vitamin D deficiency 06/06/2018  . Abnormal glucose 06/06/2018  . Elevated BP without diagnosis of  hypertension 06/06/2018  . Gastroesophageal reflux disease 04/04/2017  . Hx of diverticulitis of colon 04/04/2017  . Hepatic hemangioma, Rt lobe (7 cm) Jan 2017 12/12/2016  . Recurrent UTI 12/12/2016  . Hyperlipidemia, mixed 10/23/2016  . Hypothyroidism 10/23/2016  . Depression 10/23/2016  . Diverticulosis of colon without hemorrhage 10/23/2016    Screening Tests Immunization History  Administered Date(s) Administered  . Influenza Inj Mdck Quad With Preservative 07/27/2017  . Influenza, High Dose Seasonal PF 07/16/2018  . PPD Test 04/05/2017  . Pneumococcal Conjugate-13 06/06/2018  . Tdap 08/28/2010    Preventative care: Last colonoscopy: 2016 Last mammogram: 2019 Last pap smear/pelvic exam: 2017 DEXA: 2019 CXR: 11/2016 EKG 05/2018 Hep C Screening 06/2017 Non-reactive Prior vaccinations: TD or Tdap: 2012, Due 2022  Influenza: Received today 07/16/18  Pneumococcal: Due 05/2019 Prevnar13: 05/2018 Shingles/Zostavax: Declined   Names of Other Physician/Practitioners you currently use: 1. [facility] here for primary care 2. Eye Exam 2019 3.Dentist: 2019, Q47months  Patient Care Team: Unk Pinto, MD as PCP - General (Internal Medicine)  SURGICAL HISTORY She  has a past surgical history that includes Breast biopsy (Left); Tonsillectomy; Eye surgery; and Cholecystectomy (N/A, 02/08/2017). FAMILY HISTORY Her family history includes Breast cancer in her sister; Heart disease in her father and mother. SOCIAL HISTORY She  reports that she has never smoked. She has never used smokeless tobacco. She reports that she drinks alcohol. She reports that she does not use drugs.   MEDICARE WELLNESS OBJECTIVES: Physical activity: Current Exercise Habits: Home exercise routine(Walks 3times a week), Type of exercise: walking;strength training/weights, Time (Minutes): 30, Frequency (Times/Week): 4, Weekly Exercise (Minutes/Week): 120, Intensity:  Moderate, Exercise limited by: None identified Cardiac risk factors: Cardiac Risk Factors include: none Depression/mood screen:   Depression screen [facility] 2/9 07/18/2018  Decreased Interest 0  Down, Depressed, Hopeless 0  PHQ - 2 Score 0    ADLs:  In your present state of health, do you have any difficulty performing the following activities: 07/16/2018 06/09/2018  Hearing? Y N  Vision? N N  Difficulty concentrating or making decisions? N N  Walking or climbing stairs? N N  Dressing or bathing? N N  Doing errands, shopping? N N  Preparing Food and eating ? N -  Using the Toilet? N -  In the past six months, have you accidently leaked urine? N -  Do you have problems with loss of bowel control? N -  Managing your Medications? Y -  Managing your Finances? Y -  Housekeeping or managing your Housekeeping? Y -  Some recent data might be hidden     Cognitive Testing  Alert? Yes  Normal Appearance?Yes  Oriented to person? Yes  Place? Yes   Time? Yes  Recall of three objects?  Yes  Can perform simple calculations? Yes  Displays appropriate judgment?Yes  Can read the correct time from a watch face?Yes  EOL planning: Does Patient Have a Medical Advance Directive?: Yes Type of Advance Directive: [place], Living will Does patient want to make changes to medical advance directive?: Yes (MAU/Ambulatory/Procedural Areas - Information given) Would patient like information on creating a medical advance directive?: Yes (MAU/Ambulatory/Procedural Areas - Information given)  Review of Systems  Constitutional: Negative for chills, diaphoresis, fever, malaise/fatigue and weight loss.  HENT: Positive for hearing loss. Negative for congestion, ear discharge, ear pain, nosebleeds, sinus pain, sore throat and tinnitus.   Eyes: Negative for blurred vision, double vision, photophobia, pain, discharge and redness.  Respiratory: Negative for cough, hemoptysis, sputum production,  shortness of breath, wheezing and stridor.   Cardiovascular: Negative for chest pain, palpitations, orthopnea, claudication, leg swelling and PND.  Gastrointestinal: Negative for abdominal pain, blood in stool, constipation, diarrhea, heartburn, melena, nausea and vomiting.  Genitourinary: Negative for dysuria, flank pain, frequency, hematuria and urgency.  Musculoskeletal: Negative for back pain, falls, joint pain, myalgias and neck pain.  Skin: Negative for itching and rash.  Neurological: Negative for dizziness, tingling, tremors, sensory change, speech change, focal weakness, seizures, loss of consciousness, weakness and headaches.  Endo/Heme/Allergies: Negative for environmental allergies. Does not bruise/bleed easily.  Psychiatric/Behavioral: Negative for depression, hallucinations, memory loss, substance abuse and suicidal ideas. The patient is not nervous/anxious and does not have insomnia.      Objective:     Today's Vitals   07/16/18 1412  BP: 98/62  Pulse: 82  Temp: 97.7 F (36.5 C)  SpO2: 98%  Weight: 114 lb (51.7 kg)  Height: 5\' 1"  (1.549 m)   Body mass index is 21.54 kg/m.  General appearance: alert, no distress, WD/WN, female  HEENT: normocephalic, sclerae anicteric, TMs pearly, nares patent, no discharge or erythema, pharynx normal Oral cavity: MMM, no lesions Neck: supple, no lymphadenopathy, no thyromegaly, no masses Heart: RRR, normal S1, S2, no murmurs Lungs: CTA bilaterally, no wheezes, rhonchi, or rales Abdomen: +bs, soft, non tender, non distended, no masses, no hepatomegaly, no splenomegaly Musculoskeletal: nontender, no swelling, no obvious deformity Extremities: no edema, no cyanosis, no clubbing Pulses: 2+ symmetric, upper and lower extremities, normal cap refill Neurological: alert, oriented x 3, CN2-12 intact, strength normal upper extremities and lower extremities, sensation normal throughout, DTRs 2+ throughout, no cerebellar signs, gait  normal Psychiatric: normal affect, behavior normal, pleasant   Medicare Attestation I have personally reviewed: The patient's medical and social history Their use of alcohol, tobacco or illicit drugs Their current medications and supplements The patient's functional ability including ADLs,fall risks, home safety risks, cognitive, and hearing and visual impairment Diet and physical activities Evidence for depression or mood disorders  The patient's weight, height, BMI, and visual acuity have been recorded in the chart.  I have made referrals, counseling, and provided education to the patient based on review of the above and I have provided the patient with a written personalized care plan for preventive services.     [name], NP   07/18/2018

## 2018-07-18 ENCOUNTER — Encounter: Payer: Self-pay | Admitting: Adult Health Nurse Practitioner

## 2018-08-05 ENCOUNTER — Other Ambulatory Visit: Payer: Self-pay | Admitting: Internal Medicine

## 2018-08-07 ENCOUNTER — Other Ambulatory Visit: Payer: Self-pay | Admitting: Physician Assistant

## 2018-08-13 ENCOUNTER — Other Ambulatory Visit: Payer: Self-pay | Admitting: Adult Health Nurse Practitioner

## 2018-08-13 DIAGNOSIS — Z136 Encounter for screening for cardiovascular disorders: Secondary | ICD-10-CM

## 2018-08-13 DIAGNOSIS — Z8249 Family history of ischemic heart disease and other diseases of the circulatory system: Secondary | ICD-10-CM

## 2018-08-13 DIAGNOSIS — R030 Elevated blood-pressure reading, without diagnosis of hypertension: Secondary | ICD-10-CM

## 2018-08-13 DIAGNOSIS — O350XX0 Maternal care for (suspected) central nervous system malformation in fetus, not applicable or unspecified: Secondary | ICD-10-CM

## 2018-08-13 DIAGNOSIS — IMO0002 Reserved for concepts with insufficient information to code with codable children: Secondary | ICD-10-CM

## 2018-08-13 NOTE — Addendum Note (Signed)
Addended byGarnet Sierras A on: 08/13/2018 10:12 AM   Modules accepted: Orders

## 2018-08-26 IMAGING — CR DG FOOT COMPLETE 3+V*L*
3 series · 3 of 3 positions shown · non-contrast
Comparison: None

CLINICAL DATA: Left fluid metatarsal pain

EXAM:
LEFT FOOT - COMPLETE 3+ VIEW

[foot ap]
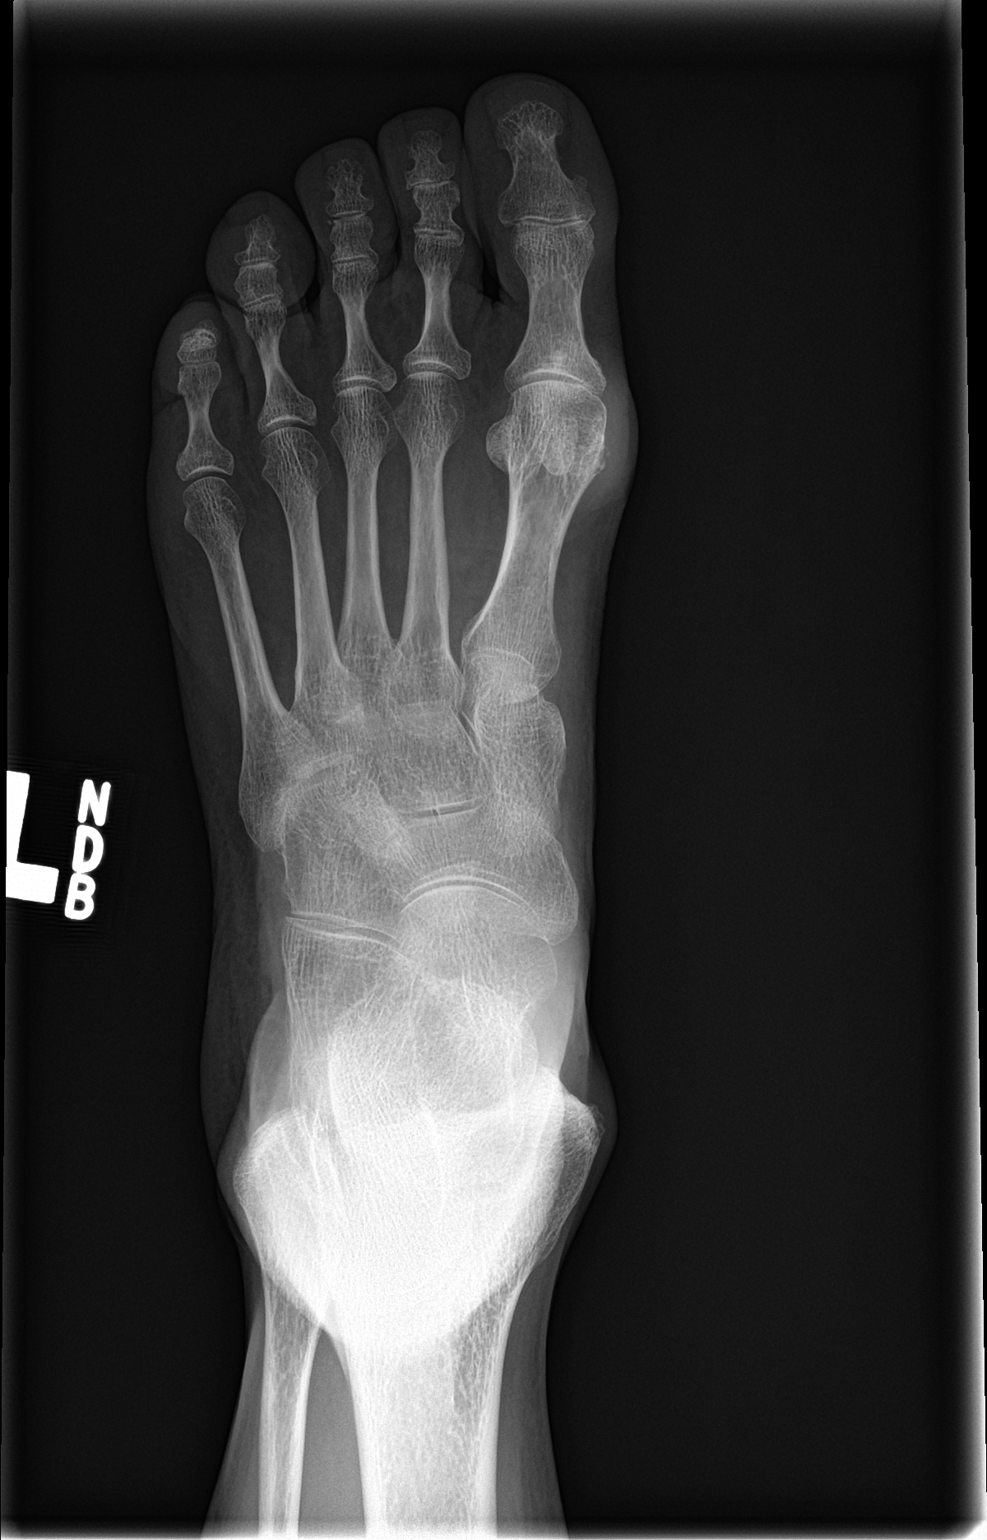

[foot obl]
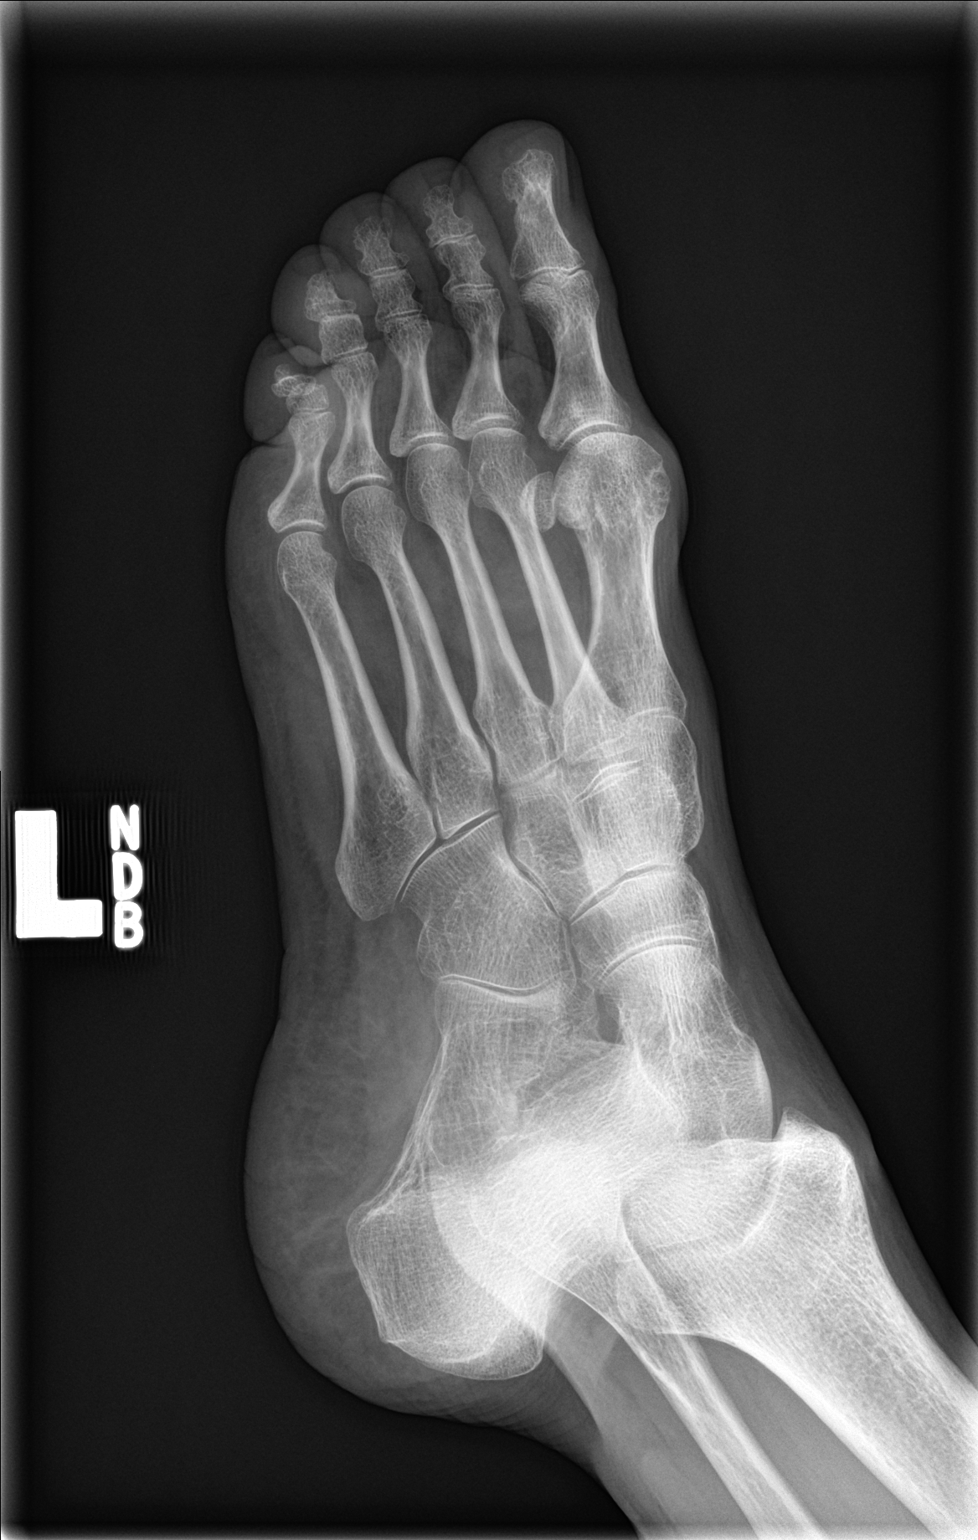

[foot lat]
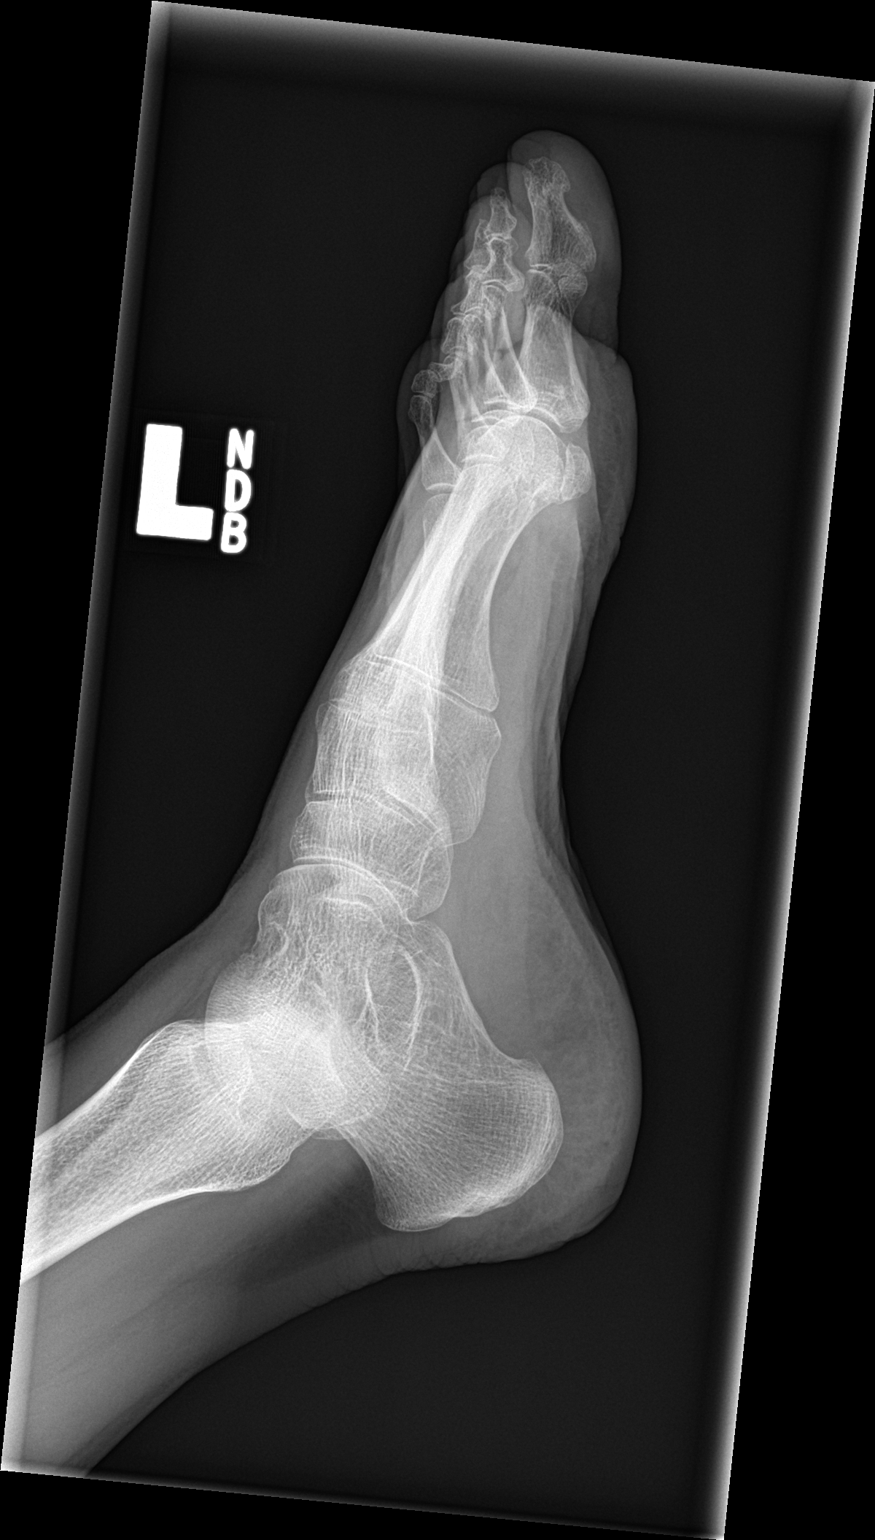

[3 of 3 positions shown; findings below may reference images not displayed]

FINDINGS: No acute fracture or subluxation identified. No significant
arthropathy or other bone abnormality. No radio-opaque foreign
bodies or soft tissue calcifications. The soft tissues are
unremarkable.
IMPRESSION: Negative.

## 2018-09-03 ENCOUNTER — Ambulatory Visit (INDEPENDENT_AMBULATORY_CARE_PROVIDER_SITE_OTHER): Payer: Medicare Other | Admitting: Sports Medicine

## 2018-09-03 ENCOUNTER — Ambulatory Visit (INDEPENDENT_AMBULATORY_CARE_PROVIDER_SITE_OTHER): Payer: Medicare Other

## 2018-09-03 ENCOUNTER — Encounter: Payer: Self-pay | Admitting: Sports Medicine

## 2018-09-03 DIAGNOSIS — M48061 Spinal stenosis, lumbar region without neurogenic claudication: Secondary | ICD-10-CM | POA: Diagnosis not present

## 2018-09-03 DIAGNOSIS — M722 Plantar fascial fibromatosis: Secondary | ICD-10-CM | POA: Diagnosis not present

## 2018-09-03 DIAGNOSIS — M7742 Metatarsalgia, left foot: Secondary | ICD-10-CM

## 2018-09-03 DIAGNOSIS — M7741 Metatarsalgia, right foot: Secondary | ICD-10-CM

## 2018-09-03 DIAGNOSIS — M19071 Primary osteoarthritis, right ankle and foot: Secondary | ICD-10-CM | POA: Diagnosis not present

## 2018-09-03 DIAGNOSIS — M79672 Pain in left foot: Secondary | ICD-10-CM | POA: Diagnosis not present

## 2018-09-03 DIAGNOSIS — M5116 Intervertebral disc disorders with radiculopathy, lumbar region: Secondary | ICD-10-CM | POA: Diagnosis not present

## 2018-09-03 MED ORDER — CELECOXIB 200 MG PO CAPS: ORAL_CAPSULE | ORAL | 2 refills | Status: DC

## 2018-09-03 NOTE — Assessment & Plan Note (Signed)
Rehab exercises given,. Return for custom molded orthotics with metatarsal pads.

## 2018-09-03 NOTE — Assessment & Plan Note (Signed)
Bilateral metatarsalgia. Bilateral foot x-rays. Return for custom molded orthotics with metatarsal pads.

## 2018-09-03 NOTE — Assessment & Plan Note (Signed)
Pain classic and consistent with lumbar spinal stenosis, left-sided radiation but not past the knee. No pain over the sacroiliac joint. Adding Celebrex, x-rays, formal PT. Return in 4 to 6 weeks, MR for interventional planning if no better.

## 2018-09-03 NOTE — Progress Notes (Signed)
Subjective:    CC: Multiple painful areas  HPI:  This is a pleasant 68 year old female, for the past several months she is had pain that she localizes on the plantar aspect of her left heel, worse with the first few steps in the morning, moderate, persistent without radiation.  In addition she has noted painful areas on the plantar aspect of her metatarsal heads on both feet, worse with weightbearing, moderate, persistent without radiation.  Lastly she has pain in the left side of her low back with radiation into the hips bilaterally, as well as the left thigh but not past the knee, no bowel or bladder dysfunction, saddle numbness, no constitutional symptoms, better with slight flexion, worse with standing up straight and leaning back, worse at night.  I reviewed the past medical history, family history, social history, surgical history, and allergies today and no changes were needed.  Please see the problem list section below in epic for further details.  Past Medical History: Past Medical History:  Diagnosis Date  . Anxiety   . Diverticular disease   . Hyperlipidemia, mixed   . Hypothyroidism 2000  . Vitamin D deficiency    Past Surgical History: Past Surgical History:  Procedure Laterality Date  . BREAST BIOPSY Left    benign  . CHOLECYSTECTOMY N/A 02/08/2017   Procedure: LAPAROSCOPIC CHOLECYSTECTOMY WITH INTRAOPERATIVE CHOLANGIOGRAM;  Surgeon: Donnie Mesa, MD;  Location: [facility];  Service: General;  Laterality: N/A;  . EYE SURGERY     Eyelid lifted  . TONSILLECTOMY     Social History: Social History   Socioeconomic History  . Marital status: Married    Spouse name: Not on file  . Number of children: Not on file  . Years of education: Not on file  . Highest education level: Not on file  Occupational History  . Not on file  Social Needs  . Financial resource strain: Not on file  . Food insecurity:    Worry: Not on file    Inability: Not on file  . Transportation  needs:    Medical: Not on file    Non-medical: Not on file  Tobacco Use  . Smoking status: Never Smoker  . Smokeless tobacco: Never Used  Substance and Sexual Activity  . Alcohol use: Yes    Comment: occasional   . Drug use: No  . Sexual activity: Yes  Lifestyle  . Physical activity:    Days per week: Not on file    Minutes per session: Not on file  . Stress: Not on file  Relationships  . Social connections:    Talks on phone: Not on file    Gets together: Not on file    Attends religious service: Not on file    Active member of club or organization: Not on file    Attends meetings of clubs or organizations: Not on file    Relationship status: Not on file  Other Topics Concern  . Not on file  Social History Narrative  . Not on file   Family History: Family History  Problem Relation Age of Onset  . Heart disease Mother   . Heart disease Father   . Breast cancer Sister    Allergies: Allergies  Allergen Reactions  . Ciprofloxacin Other (See Comments)    Alleged tendonitis   Medications: See med rec.  Review of Systems: No headache, visual changes, nausea, vomiting, diarrhea, constipation, dizziness, abdominal pain, skin rash, fevers, chills, night sweats, swollen lymph nodes, weight loss, chest  pain, body aches, joint swelling, muscle aches, shortness of breath, mood changes, visual or auditory hallucinations.  Objective:    General: Well Developed, well nourished, and in no acute distress.  Neuro: Alert and oriented x3, extra-ocular muscles intact, sensation grossly intact.  HEENT: Normocephalic, atraumatic, pupils equal round reactive to light, neck supple, no masses, no lymphadenopathy, thyroid nonpalpable.  Skin: Warm and dry, no rashes noted.  Cardiac: Regular rate and rhythm, no murmurs rubs or gallops.  Respiratory: Clear to auscultation bilaterally. Not using accessory muscles, speaking in full sentences.  Abdominal: Soft, nontender, nondistended, positive  bowel sounds, no masses, no organomegaly.  Bilateral feet: No visible erythema or swelling. Range of motion is full in all directions. Strength is 5/5 in all directions. No hallux valgus. No pes cavus or pes planus. No abnormal callus noted. No pain over the navicular prominence, or base of fifth metatarsal. Moderate tenderness to palpation at the left calcaneal insertion of plantar fascia. No pain at the Achilles insertion. No pain over the calcaneal bursa. No pain of the retrocalcaneal bursa. No tenderness to palpation over the tarsals, metatarsals, or phalanges. No hallux rigidus or limitus. No tenderness palpation over interphalangeal joints. No pain with compression of the metatarsal heads. Neurovascularly intact distally. Foot inspection and palpation reveals breakdown of the transverse arch and a drop of MT heads. Abnormal callus is present between the second, third, and fourth metatarsal heads. Hammer toes are present. TTP under the left and right second through fourth metatarsal heads. Back Exam:  Inspection: Unremarkable  Motion: Flexion 45 deg, Extension 45 deg, Side Bending to 45 deg bilaterally,  Rotation to 45 deg bilaterally  SLR laying: Negative  XSLR laying: Negative  Palpable tenderness: None. FABER: negative. Sensory change: Gross sensation intact to all lumbar and sacral dermatomes.  Reflexes: 2+ at both patellar tendons, 2+ at achilles tendons, Babinski's downgoing.  Strength at foot  Plantar-flexion: 5/5 Dorsi-flexion: 5/5 Eversion: 5/5 Inversion: 5/5  Leg strength  Quad: 5/5 Hamstring: 5/5 Hip flexor: 5/5 Hip abductors: 5/5  Gait unremarkable.  Impression and Recommendations:    The patient was counselled, risk factors were discussed, anticipatory guidance given.  Lumbar spinal stenosis Pain classic and consistent with lumbar spinal stenosis, left-sided radiation but not past the knee. No pain over the sacroiliac joint. Adding Celebrex, x-rays,  formal PT. Return in 4 to 6 weeks, MR for interventional planning if no better.  Metatarsalgia of both feet Bilateral metatarsalgia. Bilateral foot x-rays. Return for custom molded orthotics with metatarsal pads.  Plantar fasciitis, left Rehab exercises given,. Return for custom molded orthotics with metatarsal pads. ___________________________________________ Gwen Her. Dianah Field, M.D., ABFM., CAQSM. Primary Care and Sports Medicine [facility]  Adjunct Professor of [employer]

## 2018-09-05 ENCOUNTER — Encounter: Payer: Self-pay | Admitting: Rehabilitative and Restorative Service Providers"

## 2018-09-05 ENCOUNTER — Other Ambulatory Visit: Payer: Self-pay

## 2018-09-05 ENCOUNTER — Encounter: Payer: Self-pay | Admitting: Sports Medicine

## 2018-09-05 ENCOUNTER — Ambulatory Visit (INDEPENDENT_AMBULATORY_CARE_PROVIDER_SITE_OTHER): Payer: Medicare Other | Admitting: Sports Medicine

## 2018-09-05 ENCOUNTER — Ambulatory Visit (INDEPENDENT_AMBULATORY_CARE_PROVIDER_SITE_OTHER): Payer: Medicare Other | Admitting: Rehabilitative and Restorative Service Providers"

## 2018-09-05 DIAGNOSIS — M545 Low back pain, unspecified: Secondary | ICD-10-CM

## 2018-09-05 DIAGNOSIS — M7741 Metatarsalgia, right foot: Secondary | ICD-10-CM | POA: Diagnosis not present

## 2018-09-05 DIAGNOSIS — M25552 Pain in left hip: Secondary | ICD-10-CM | POA: Diagnosis not present

## 2018-09-05 DIAGNOSIS — R29898 Other symptoms and signs involving the musculoskeletal system: Secondary | ICD-10-CM

## 2018-09-05 DIAGNOSIS — M722 Plantar fascial fibromatosis: Secondary | ICD-10-CM | POA: Diagnosis not present

## 2018-09-05 DIAGNOSIS — M7742 Metatarsalgia, left foot: Secondary | ICD-10-CM | POA: Diagnosis not present

## 2018-09-05 NOTE — Patient Instructions (Signed)
Access Code: [email or web address]  URL: [email or web address]  Date: 09/05/2018  Prepared by: Gillermo Murdoch   Exercises  Supine Hamstring Stretch with Strap - 3 reps - 1 sets - 30 seconds hold - 2x daily - 7x weekly  Supine ITB Stretch with Strap - 3 reps - 1 sets - 30 seconds hold - 2x daily - 7x weekly  Supine Piriformis Stretch with Leg Straight - 3 reps - 1 sets - 30 seconds hold - 2x daily - 7x weekly  Prone Quadriceps Stretch with Strap - 3 reps - 1 sets - 30 seconds hold - 2x daily - 7x weekly  Seated Hip Flexor Stretch - 3 reps - 1 sets - 30 seconds hold - 2x daily - 7x weekly  Patient Education  TENS Unit  Posture and Body Mechanics  Posture and Body Mechanics

## 2018-09-05 NOTE — Therapy (Addendum)
[place], [name], [address] Phone: [phone]   Fax:  [phone]  Physical Therapy Evaluation  Patient Details  Name: Isabel Tyler MRN: [id number] Date of Birth: [birth year] Referring Provider (PT): Dr Dianah Field    Encounter Date: 09/05/2018  PT End of Session - 09/05/18 1053    Visit Number  1    Number of Visits  12    Date for PT Re-Evaluation  10/17/18    PT Start Time  1053    PT Stop Time  1155    PT Time Calculation (min)  62 min    Activity Tolerance  Patient tolerated treatment well       Past Medical History:  Diagnosis Date  . Anxiety   . Diverticular disease   . Hyperlipidemia, mixed   . Hypothyroidism 2000  . Vitamin D deficiency     Past Surgical History:  Procedure Laterality Date  . BREAST BIOPSY Left    benign  . CHOLECYSTECTOMY N/A 02/08/2017   Procedure: LAPAROSCOPIC CHOLECYSTECTOMY WITH INTRAOPERATIVE CHOLANGIOGRAM;  Surgeon: Donnie Mesa, MD;  Location: [facility];  Service: General;  Laterality: N/A;  . EYE SURGERY     Eyelid lifted  . TONSILLECTOMY      There were no vitals filed for this visit.   Subjective Assessment - 09/05/18 1101    Subjective  Patient reports gradual onset of LBP ~ 3 weeks ago. She does not know of any accident of injury to back. She has no history of LBP. She reports Lt heel and plantar surface pain about the same time as onset of LBP. Foot pain is improving with inserts (today) and some exercises from the doctor.     Pertinent History  arthritis; osteoporosis     Patient Stated Goals  get rid of the back pain     Currently in Pain?  Yes    Pain Score  0-No pain    Pain Location  Back    Pain Orientation  Left;Lower    Pain Descriptors / Indicators  Throbbing    Pain Type  Acute pain    Pain Radiating Towards  into Lt hip and thigh but not below knee     Pain Onset  1 to 4 weeks ago    Pain Frequency  Intermittent    Aggravating Factors   standing and pushing hips side to side     Pain Relieving Factors  sitting with heating pad; avoiding activities that irritate pain          [facility] PT Assessment - 09/05/18 0001      Assessment   Medical Diagnosis  Lt LBP     Referring Provider (PT)  Dr Dianah Field     Onset Date/Surgical Date  08/11/18    Hand Dominance  Right    Next MD Visit  4 weeks     Prior Therapy  none      Precautions   Precautions  None      Balance Screen   Has the patient fallen in the past 6 months  No    Has the patient had a decrease in activity level because of a fear of falling?   No    Is the patient reluctant to leave their home because of a fear of falling?   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     Leisure  caring for 2 yr old granddaughter 2 days/wk; househould chores; walking 2 miles/day 3 days/wk; low impact aerobic tape       Observation/Other Assessments   Focus on Therapeutic Outcomes (FOTO)   47% limitation       Sensation   Additional Comments  WFL's per pt report       Posture/Postural Control   Posture Comments  head forward; shoulders rounded slightly; slight ER of hips in standing       AROM   Right/Left Hip  --   tight Lt hip end ranges   Lumbar Flexion  90%    Lumbar Extension  65%    Lumbar - Right Side Bend  75% pulling Lt     Lumbar - Left Side Bend  75% pulling Rt     Lumbar - Right Rotation  505    Lumbar - Left Rotation  50%      Strength   Overall Strength Comments  WFL's bilat LE's       Flexibility   Hamstrings  tight Rt > Lt     Quadriceps  tight Lt > Rt     ITB  tight Rt > Lt     Piriformis  tightness Lt > Rt       Palpation   Spinal mobility  tender with CPA mobs lumbas spine     Palpation comment  muscular tightness Lt posterior hip through the glut med/min; piriformis; lumbar paraspinals/QL; Rt > Lt  psoas        Special Tests   Other special tests  (-) SLF' fabers         treatment consisted of exercise - see pt instrctions for exercises and reps  Treatment concluded with IFC estim to Lt lumbar and posterior hip area with moist heat         Objective measurements completed on examination: See above findings.                   PT Long Term Goals - 09/05/18 1335      PT LONG TERM GOAL #1   Title  Improve mobility and ROM through lumbar spine and bilat hips to WFL's and pain free 10/17/2018    Time  6    Period  Weeks    Status  New      PT LONG TERM GOAL #2   Title  Decrease pain with patient to report returning to normal functional activities and walking program 10/17/2018    Time  6    Period  Weeks    Status  New      PT LONG TERM GOAL #3   Title  Independent in HEP 10/17/2018    Time  6    Period  Weeks    Status  New      PT LONG TERM GOAL #4   Title  Improve FOTO to </= 41% limitation 10/17/2018    Time  6    Period  Weeks    Status  New             Plan - 09/05/18 1134    Clinical Impression Statement  Deb presents with ~ 3 week history of Lt LBP to Lt hip pain with no known injury. Patient does relate that she was experiencing pain from Lt plantar fasciitis and heel pain during that same time. She has been fitted with custom  orthotics which she received today. Patient has limited trunk and LE mobility/ROM; muscular tightness to palpation through Rt psoas; Lt posterior hip in glut min/med into the piriformis; decreased exercise and position tolerance; limited ADL's and pain on a daily basis. Patient will benefit from PT to address problems identified.     History and Personal Factors relevant to plan of care:  Lt plantar fasciitis; bilat heel pain     Clinical Presentation  Stable    Clinical Decision Making  Low    Rehab Potential  Good    PT Frequency  2x / week    PT Duration  6 weeks    PT Treatment/Interventions  Patient/family  education;ADLs/Self Care Home Management;Cryotherapy;Electrical Stimulation;Iontophoresis 4mg /ml Dexamethasone;Moist Heat;Ultrasound;Dry needling;Manual techniques;Neuromuscular re-education;Functional mobility training;Therapeutic activities;Therapeutic exercise    PT Next Visit Plan  review HEP; add myofacial ball release work; manual work Lt posterior hip; progress with stabilization/strengthening; modalities as indicated - address plantar fasciitis with appropriate exercise and self massage     PT Home Exercise Plan   Access Code: [email or web address]     Consulted and Agree with Plan of Care  Patient       Patient will benefit from skilled therapeutic intervention in order to improve the following deficits and impairments:  Postural dysfunction, Improper body mechanics, Pain, Increased fascial restricitons, Increased muscle spasms, Hypomobility, Decreased mobility, Decreased range of motion, Decreased activity tolerance  Visit Diagnosis: Acute left-sided low back pain without sciatica - Plan: PT plan of care cert/re-cert  Pain in left hip - Plan: PT plan of care cert/re-cert  Other symptoms and signs involving the musculoskeletal system - Plan: PT plan of care cert/re-cert     Problem List Patient Active Problem List   Diagnosis Date Noted  . Plantar fasciitis, left 09/03/2018  . Metatarsalgia of both feet 09/03/2018  . Lumbar spinal stenosis 09/03/2018  . Presbycusis of both ears 07/16/2018  . Need for influenza vaccination 07/16/2018  . Screening for cardiovascular condition 06/06/2018  . FHx: heart disease 06/06/2018  . Vitamin D deficiency 06/06/2018  . Abnormal glucose 06/06/2018  . Elevated BP without diagnosis of hypertension 06/06/2018  . Gastroesophageal reflux disease 04/04/2017  . Hx of diverticulitis of colon 04/04/2017  . Hepatic hemangioma, Rt lobe (7 cm) Jan 2017 12/12/2016  . Recurrent UTI 12/12/2016  . Hyperlipidemia, mixed 10/23/2016  . Hypothyroidism 10/23/2016  .  Depression 10/23/2016  . Diverticulosis of colon without hemorrhage 10/23/2016    Celyn Nilda Simmer PT, MPH  09/05/2018, 3:35 PM  [facility] [place], [name], [address] Phone: [phone]   Fax:  [phone]  Name: Fayrene Towner MRN: [id number] Date of Birth: 11/09/1950

## 2018-09-05 NOTE — Assessment & Plan Note (Signed)
New set of orthotics. Metatarsal pads.

## 2018-09-05 NOTE — Assessment & Plan Note (Signed)
New set of custom orthotics.

## 2018-09-05 NOTE — Progress Notes (Signed)
    Patient was fitted for a : standard, cushioned, semi-rigid orthotic. The orthotic was heated and afterward the patient stood on the orthotic blank positioned on the orthotic stand. The patient was positioned in subtalar neutral position and 10 degrees of ankle dorsiflexion in a weight bearing stance. After completion of molding, a stable base was applied to the orthotic blank. The blank was ground to a stable position for weight bearing. Size: 6 Base: [employer] and safety inspector and Padding: Bilateral some on metatarsal pads The patient ambulated these, and they were very comfortable.  I spent 40 minutes with this patient, greater than 50% was face-to-face time counseling regarding the below diagnosis, specifically diagnosis and treatment options for metatarsalgia, plantar fasciitis.  ___________________________________________ Isabel Tyler. Dianah Field, M.D., ABFM., CAQSM. Primary Care and [place] Instructor of [place] [facility]

## 2018-09-12 ENCOUNTER — Ambulatory Visit (INDEPENDENT_AMBULATORY_CARE_PROVIDER_SITE_OTHER): Payer: Medicare Other | Admitting: Rehabilitative and Restorative Service Providers"

## 2018-09-12 ENCOUNTER — Encounter: Payer: Self-pay | Admitting: Rehabilitative and Restorative Service Providers"

## 2018-09-12 DIAGNOSIS — R29898 Other symptoms and signs involving the musculoskeletal system: Secondary | ICD-10-CM

## 2018-09-12 DIAGNOSIS — M545 Low back pain, unspecified: Secondary | ICD-10-CM

## 2018-09-12 DIAGNOSIS — M25552 Pain in left hip: Secondary | ICD-10-CM | POA: Diagnosis not present

## 2018-09-12 NOTE — Patient Instructions (Signed)

## 2018-09-12 NOTE — Therapy (Signed)
[place], [name], [address] Phone: [phone]   Fax:  [phone]  Physical Therapy Treatment  Patient Details  Name: Isabel Tyler MRN: [id number] Date of Birth: 1951-01-29 Referring Provider (PT): Dr Dianah Field    Encounter Date: 09/12/2018  PT End of Session - 09/12/18 1023    Visit Number  2    Number of Visits  12    Date for PT Re-Evaluation  10/17/18    PT Start Time  1018    PT Stop Time  1111    PT Time Calculation (min)  53 min    Activity Tolerance  Patient tolerated treatment well       Past Medical History:  Diagnosis Date  . Anxiety   . Diverticular disease   . Hyperlipidemia, mixed   . Hypothyroidism 2000  . Vitamin D deficiency     Past Surgical History:  Procedure Laterality Date  . BREAST BIOPSY Left    benign  . CHOLECYSTECTOMY N/A 02/08/2017   Procedure: LAPAROSCOPIC CHOLECYSTECTOMY WITH INTRAOPERATIVE CHOLANGIOGRAM;  Surgeon: Donnie Mesa, MD;  Location: [facility];  Service: General;  Laterality: N/A;  . EYE SURGERY     Eyelid lifted  . TONSILLECTOMY      There were no vitals filed for this visit.  Subjective Assessment - 09/12/18 1023    Subjective  Continued pain in the back - often happens when she is walking - will have pain in the foot and then it will shoot up into the hip and back. Dull ache in the Lt heel. Not sure the inserts have changed anything.     Currently in Pain?  Yes    Pain Score  5     Pain Location  Back    Pain Orientation  Left;Lower    Pain Descriptors / Indicators  Aching;Dull;Throbbing    Pain Type  Acute pain                       [facility] Adult PT Treatment/Exercise - 09/12/18 0001      Lumbar Exercises: Stretches   Passive Hamstring Stretch  Left;3 reps;30 seconds   supine with strap    Single Knee to Chest Stretch  --   Lt knee ot opposite shoulder 2 reps x 30 sec    Quad Stretch  Left;2 reps;30 seconds    prone with strap    ITB Stretch  Left;2 reps;30 seconds   supine with strap    Piriformis Stretch  Left;2 reps;30 seconds   supine travell - added varied angles x 2  at 30 se      Moist Heat Therapy   Number Minutes Moist Heat  20 Minutes    Moist Heat Location  Lumbar Spine;Hip   Lt      Acupuncturist Location  Lt lumbar to Lt hip     Electrical Stimulation Action  TENs     Electrical Stimulation Parameters  to toerance     Electrical Stimulation Goals  Pain;Tone      Manual Therapy   Manual therapy comments  pt supine hooklying     Joint Mobilization  mobs through Lt foot calcaneous; tarsals; metatarsals     Soft tissue mobilization  deep tissue work to pt tolerance through the Lt plantar surface into Lt heel     Passive ROM  stretch Lt foot and calf     Kinesiotex  Inhibit Muscle  tape 2 I strips plantar surface; one perp heel/2 arch             PT Education - 09/12/18 1106    Education Details  HEP kineso tape     Person(s) Educated  Patient    Methods  Explanation;Demonstration;Tactile cues;Verbal cues;Handout    Comprehension  Verbalized understanding;Returned demonstration;Verbal cues required;Tactile cues required          PT Long Term Goals - 09/05/18 1335      PT LONG TERM GOAL #1   Title  Improve mobility and ROM through lumbar spine and bilat hips to WFL's and pain free 10/17/2018    Time  6    Period  Weeks    Status  New      PT LONG TERM GOAL #2   Title  Decrease pain with patient to report returning to normal functional activities and walking program 10/17/2018    Time  6    Period  Weeks    Status  New      PT LONG TERM GOAL #3   Title  Independent in HEP 10/17/2018    Time  6    Period  Weeks    Status  New      PT LONG TERM GOAL #4   Title  Improve FOTO to </= 41% limitation 10/17/2018    Time  6    Period  Weeks    Status  New            Plan - 09/12/18 1026    Clinical Impression  Statement  Continued pain in the Lt LB and Lt heel. Patient is not sure the inserts have helped the heel pain - husband will try to put inserts into her more comfortable shoes. Patient has pain and tenderness to palpation Lt heel to arch. Foot/heel pain contributes to hip and back pain due to abnormal gait pattern with decrese wt bearing Lt LE and antalgic gait with limp Lt LE.     Rehab Potential  Good    PT Frequency  2x / week    PT Duration  6 weeks    PT Treatment/Interventions  Patient/family education;ADLs/Self Care Home Management;Cryotherapy;Electrical Stimulation;Iontophoresis 4mg /ml Dexamethasone;Moist Heat;Ultrasound;Dry needling;Manual techniques;Neuromuscular re-education;Functional mobility training;Therapeutic activities;Therapeutic exercise    PT Next Visit Plan  review HEP; add myofacial ball release work; manual work Lt posterior hip; progress with stabilization/strengthening; modalities as indicated - address plantar fasciitis with appropriate exercise and self massage  - assess response to tape for Lt foot     PT Home Exercise Plan   Access Code: [email or web address]     Consulted and Agree with Plan of Care  Patient       Patient will benefit from skilled therapeutic intervention in order to improve the following deficits and impairments:  Postural dysfunction, Improper body mechanics, Pain, Increased fascial restricitons, Increased muscle spasms, Hypomobility, Decreased mobility, Decreased range of motion, Decreased activity tolerance  Visit Diagnosis: Acute left-sided low back pain without sciatica  Pain in left hip  Other symptoms and signs involving the musculoskeletal system     Problem List Patient Active Problem List   Diagnosis Date Noted  . Plantar fasciitis, left 09/03/2018  . Metatarsalgia of both feet 09/03/2018  . Lumbar spinal stenosis 09/03/2018  . Presbycusis of both ears 07/16/2018  . Need for influenza vaccination 07/16/2018  . Screening for cardiovascular  condition 06/06/2018  . FHx: heart disease 06/06/2018  . Vitamin D deficiency 06/06/2018  . Abnormal  glucose 06/06/2018  . Elevated BP without diagnosis of hypertension 06/06/2018  . Gastroesophageal reflux disease 04/04/2017  . Hx of diverticulitis of colon 04/04/2017  . Hepatic hemangioma, Rt lobe (7 cm) Jan 2017 12/12/2016  . Recurrent UTI 12/12/2016  . Hyperlipidemia, mixed 10/23/2016  . Hypothyroidism 10/23/2016  . Depression 10/23/2016  . Diverticulosis of colon without hemorrhage 10/23/2016    Iam Lipson Nilda Simmer PT, MPH  09/12/2018, 6:33 PM  [address] [place], [name], [address] Phone: [phone]   Fax:  [phone]  Name: Saory Carriero MRN: [id number] Date of Birth: 04-Nov-1950

## 2018-09-17 ENCOUNTER — Ambulatory Visit (INDEPENDENT_AMBULATORY_CARE_PROVIDER_SITE_OTHER): Payer: Medicare Other | Admitting: Physical Therapy

## 2018-09-17 ENCOUNTER — Encounter: Payer: Self-pay | Admitting: Physical Therapy

## 2018-09-17 DIAGNOSIS — R29898 Other symptoms and signs involving the musculoskeletal system: Secondary | ICD-10-CM

## 2018-09-17 DIAGNOSIS — M25552 Pain in left hip: Secondary | ICD-10-CM

## 2018-09-17 DIAGNOSIS — M545 Low back pain, unspecified: Secondary | ICD-10-CM

## 2018-09-17 NOTE — Therapy (Signed)
[place], [name], [address] Phone: [phone]   Fax:  [phone]  Physical Therapy Treatment  Patient Details  Name: Isabel Tyler MRN: [id number] Date of Birth: [birth year] Referring Provider (PT): Dr Dianah Field    Encounter Date: 09/17/2018  PT End of Session - 09/17/18 0851    Visit Number  3    Number of Visits  12    Date for PT Re-Evaluation  10/17/18    PT Start Time  0848    PT Stop Time  0942   MHP last 12 mi n   PT Time Calculation (min)  54 min       Past Medical History:  Diagnosis Date  . Anxiety   . Diverticular disease   . Hyperlipidemia, mixed   . Hypothyroidism 2000  . Vitamin D deficiency     Past Surgical History:  Procedure Laterality Date  . BREAST BIOPSY Left    benign  . CHOLECYSTECTOMY N/A 02/08/2017   Procedure: LAPAROSCOPIC CHOLECYSTECTOMY WITH INTRAOPERATIVE CHOLANGIOGRAM;  Surgeon: Donnie Mesa, MD;  Location: [facility];  Service: General;  Laterality: N/A;  . EYE SURGERY     Eyelid lifted  . TONSILLECTOMY      There were no vitals filed for this visit.  Subjective Assessment - 09/17/18 0852    Subjective  Pt reports things are improving.  "But by the end of the day, my foot is feeling a little sore and so is my back".  She has loved having tape on foot.  She has walked for 1/2hr 2 x since last visit, and had some soreness;  did seated exercises and the pain reduced.     Pertinent History  arthritis; osteoporosis     Patient Stated Goals  get rid of the back pain     Currently in Pain?  No/denies    Pain Score  0-No pain         [facility] PT Assessment - 09/17/18 0001      Assessment   Medical Diagnosis  Lt LBP     Referring Provider (PT)  Dr Dianah Field     Onset Date/Surgical Date  08/11/18    Hand Dominance  Right    Next MD Visit  4 weeks     Prior Therapy  none        [facility] Adult PT Treatment/Exercise - 09/17/18 0001      Self-Care    Self-Care  Other Self-Care Comments    Other Self-Care Comments   pt educated on self massage with ball to lower back and hip musculature; also educated on massage with roller stick to LLE. Pt returned demo with cues and verbalized understanding.       Exercises   Exercises  Lumbar;Ankle      Lumbar Exercises: Stretches   Passive Hamstring Stretch  Right;Left;3 reps;20 seconds   seated    Hip Flexor Stretch  Left;Right;1 rep;20 seconds   trial; unable to feel stretch, bothered Lt side of back   Quad Stretch  Left;Right;2 reps   seated; foot under chair.   Piriformis Stretch  Right;Left;2 reps;30 seconds   seated   Gastroc Stretch  Left;Right;2 reps;20 seconds   runners stretch   Gastroc Stretch Limitations  pt reported increased Lt hip pain with Rt calf stretch      Lumbar Exercises: Aerobic   Nustep  L5: arms/legs x 6 min       Lumbar Exercises: Seated   Other  Seated Lumbar Exercises  pt educated on supine to/from sit via log roll; pt returned demo.        Lumbar Exercises: Supine   Ab Set  10 reps;5 seconds      Moist Heat Therapy   Number Minutes Moist Heat  12 Minutes    Moist Heat Location  Lumbar Spine      Electrical Stimulation   Electrical Stimulation Location  --   pt declined     Manual Therapy   Kinesiotex  Inhibit Muscle   tape 2- I strips plantar surface-2 strips perpendicular     Ankle Exercises: Stretches   Plantar Fascia Stretch  2 reps;20 seconds   bilat                 PT Long Term Goals - 09/05/18 1335      PT LONG TERM GOAL #1   Title  Improve mobility and ROM through lumbar spine and bilat hips to WFL's and pain free 10/17/2018    Time  6    Period  Weeks    Status  New      PT LONG TERM GOAL #2   Title  Decrease pain with patient to report returning to normal functional activities and walking program 10/17/2018    Time  6    Period  Weeks    Status  New      PT LONG TERM GOAL #3   Title  Independent in HEP 10/17/2018     Time  6    Period  Weeks    Status  New      PT LONG TERM GOAL #4   Title  Improve FOTO to </= 41% limitation 10/17/2018    Time  6    Period  Weeks    Status  New            Plan - 09/17/18 1004    Clinical Impression Statement  Pt reporting less pain in low back and heel.  Positive response to taping of Lt foot.  She tolerated all stretches but the runner stretch; this increased discomfort in her Lt hip with her Rt leg back. Pt encouraged to hold off on holding her granddaughter on her Lt hip as this could contribute to her pain.  Pt declined estim at end of session as she was still painfree.  Progressing well towards goals.     Rehab Potential  Good    PT Frequency  2x / week    PT Duration  6 weeks    PT Treatment/Interventions  Patient/family education;ADLs/Self Care Home Management;Cryotherapy;Electrical Stimulation;Iontophoresis 4mg /ml Dexamethasone;Moist Heat;Ultrasound;Dry needling;Manual techniques;Neuromuscular re-education;Functional mobility training;Therapeutic activities;Therapeutic exercise    PT Next Visit Plan  continue pelvis/spine stabilization exercises and LE stretches.      PT Home Exercise Plan   Access Code: [email or web address]     Consulted and Agree with Plan of Care  Patient       Patient will benefit from skilled therapeutic intervention in order to improve the following deficits and impairments:  Postural dysfunction, Improper body mechanics, Pain, Increased fascial restricitons, Increased muscle spasms, Hypomobility, Decreased mobility, Decreased range of motion, Decreased activity tolerance  Visit Diagnosis: Acute left-sided low back pain without sciatica  Pain in left hip  Other symptoms and signs involving the musculoskeletal system     Problem List Patient Active Problem List   Diagnosis Date Noted  . Plantar fasciitis, left 09/03/2018  . Metatarsalgia of both feet 09/03/2018  . Lumbar  spinal stenosis 09/03/2018  . Presbycusis of both ears  07/16/2018  . Need for influenza vaccination 07/16/2018  . Screening for cardiovascular condition 06/06/2018  . FHx: heart disease 06/06/2018  . Vitamin D deficiency 06/06/2018  . Abnormal glucose 06/06/2018  . Elevated BP without diagnosis of hypertension 06/06/2018  . Gastroesophageal reflux disease 04/04/2017  . Hx of diverticulitis of colon 04/04/2017  . Hepatic hemangioma, Rt lobe (7 cm) Jan 2017 12/12/2016  . Recurrent UTI 12/12/2016  . Hyperlipidemia, mixed 10/23/2016  . Hypothyroidism 10/23/2016  . Depression 10/23/2016  . Diverticulosis of colon without hemorrhage 10/23/2016   Kerin Perna, PTA 09/17/18 10:11 AM  [facility] [place], [name], [address] Phone: [phone]   Fax:  [phone]  Name: Isabel Tyler MRN: [id number] Date of Birth: [birth year]

## 2018-09-18 ENCOUNTER — Telehealth: Payer: Self-pay | Admitting: Internal Medicine

## 2018-09-18 NOTE — Telephone Encounter (Signed)
patient called to request recommendation for dematology. per Dr Melford Aase, [place] - [phone]. Patient will call insurance company to verify in network provider. Will call back if she needs further assistance

## 2018-09-19 ENCOUNTER — Ambulatory Visit (INDEPENDENT_AMBULATORY_CARE_PROVIDER_SITE_OTHER): Payer: Medicare Other | Admitting: Physical Therapy

## 2018-09-19 DIAGNOSIS — M545 Low back pain, unspecified: Secondary | ICD-10-CM

## 2018-09-19 DIAGNOSIS — R29898 Other symptoms and signs involving the musculoskeletal system: Secondary | ICD-10-CM

## 2018-09-19 DIAGNOSIS — M25552 Pain in left hip: Secondary | ICD-10-CM | POA: Diagnosis not present

## 2018-09-19 NOTE — Therapy (Signed)
[place] [address] [place] [address] [place], [name], [address] Phone: [phone]   Fax:  [phone]  Physical Therapy Treatment  Patient Details  Name: Isabel Tyler MRN: [id number] Date of Birth: 09/26/1950 Referring Provider (PT): Dr Dianah Field    Encounter Date: 09/19/2018  PT End of Session - 09/19/18 1406    Visit Number  4    Number of Visits  12    Date for PT Re-Evaluation  10/17/18    PT Start Time  1017    PT Stop Time  1110   MHP last 12 min   PT Time Calculation (min)  53 min    Activity Tolerance  Patient tolerated treatment well    Behavior During Therapy  [facility] for tasks assessed/performed       Past Medical History:  Diagnosis Date  . Anxiety   . Diverticular disease   . Hyperlipidemia, mixed   . Hypothyroidism 2000  . Vitamin D deficiency     Past Surgical History:  Procedure Laterality Date  . BREAST BIOPSY Left    benign  . CHOLECYSTECTOMY N/A 02/08/2017   Procedure: LAPAROSCOPIC CHOLECYSTECTOMY WITH INTRAOPERATIVE CHOLANGIOGRAM;  Surgeon: Donnie Mesa, MD;  Location: [facility];  Service: General;  Laterality: N/A;  . EYE SURGERY     Eyelid lifted  . TONSILLECTOMY      There were no vitals filed for this visit.  Subjective Assessment - 09/19/18 1022    Subjective  Bryanda reports she is more aware of how often she lifts her granddaughter up when caring for her.   Still has difficulty getting her into her car seat.  Her pain varies; pain in heel upon standing, and pain in hip/low back with twisting or lifting granddaughter (4/10)    Patient Stated Goals  get rid of the back pain     Currently in Pain?  No/denies    Pain Score  0-No pain         [facility] PT Assessment - 09/19/18 0001      Assessment   Medical Diagnosis  Lt LBP     Referring Provider (PT)  Dr Dianah Field     Onset Date/Surgical Date  08/11/18    Hand Dominance  Right    Next MD Visit  4 weeks     Prior Therapy  none       Palpation   SI assessment   Lt ASIS higher than Rt; Lt sacral torsion     Palpation comment  pt tender in bilat iliopsoas; tender to touch at Lt L5/S1 and SI joint       [facility] Adult PT Treatment/Exercise - 09/19/18 0001      Lumbar Exercises: Stretches   Passive Hamstring Stretch  Left;Right;3 reps;30 seconds    Quad Stretch  Left;2 reps;30 seconds   prone with strap    Quad Stretch Limitations  some pain in Lt LB with Lt quad stretch; reduced pain with towel above knee    Piriformis Stretch  Right;Left;2 reps;30 seconds   supine, knee towards opp shoulder    Gastroc Stretch  2 reps;30 seconds   both; incline board   Gastroc Stretch Limitations  soleus stretch on incline board x 30 sec x 2 reps       Lumbar Exercises: Aerobic   Nustep  L4: arms/legs x 6 min       Lumbar Exercises: Supine   Ab Set  10 reps;5 seconds    Clam  10 reps  with ab set    Bent Knee Raise  10 reps   with ab set   Bridge  10 reps;3 seconds      Moist Heat Therapy   Number Minutes Moist Heat  12 Minutes    Moist Heat Location  Lumbar Spine      Manual Therapy   Manual Therapy  Muscle Energy Technique    Soft tissue mobilization  STM to bilat iliacus, Lt lateral thigh, Lt QL    Muscle Energy Technique  MET to correct Lt sacral torsion in prone; MET to correct post rotated Lt ilium (in prone)                   PT Long Term Goals - 09/05/18 1335      PT LONG TERM GOAL #1   Title  Improve mobility and ROM through lumbar spine and bilat hips to WFL's and pain free 10/17/2018    Time  6    Period  Weeks    Status  New      PT LONG TERM GOAL #2   Title  Decrease pain with patient to report returning to normal functional activities and walking program 10/17/2018    Time  6    Period  Weeks    Status  New      PT LONG TERM GOAL #3   Title  Independent in HEP 10/17/2018    Time  6    Period  Weeks    Status  New      PT LONG TERM GOAL #4   Title  Improve FOTO to </= 41% limitation  10/17/2018    Time  6    Period  Weeks    Status  New            Plan - 09/19/18 1103    Clinical Impression Statement  Pt presents with slight pelvis asymmetries; minimal change with MET correction.  She tolerated all exercises well, except prone Lt quad stretch; reported increased low back discomfort.  Pt more mindful of positions that are aggrivatingto her Lt back and foot.  Progressing towards goals.     Rehab Potential  Good    PT Frequency  2x / week    PT Duration  6 weeks    PT Treatment/Interventions  Patient/family education;ADLs/Self Care Home Management;Cryotherapy;Electrical Stimulation;Iontophoresis 67m/ml Dexamethasone;Moist Heat;Ultrasound;Dry needling;Manual techniques;Neuromuscular re-education;Functional mobility training;Therapeutic activities;Therapeutic exercise    PT Next Visit Plan  continue pelvis/spine stabilization exercises and LE stretches.  Progress HEP as tolerated.     PT Home Exercise Plan   Access Code: [email or web address]    Consulted and Agree with Plan of Care  Patient       Patient will benefit from skilled therapeutic intervention in order to improve the following deficits and impairments:  Postural dysfunction, Improper body mechanics, Pain, Increased fascial restricitons, Increased muscle spasms, Hypomobility, Decreased mobility, Decreased range of motion, Decreased activity tolerance  Visit Diagnosis: Acute left-sided low back pain without sciatica  Pain in left hip  Other symptoms and signs involving the musculoskeletal system     Problem List Patient Active Problem List   Diagnosis Date Noted  . Plantar fasciitis, left 09/03/2018  . Metatarsalgia of both feet 09/03/2018  . Lumbar spinal stenosis 09/03/2018  . Presbycusis of both ears 07/16/2018  . Need for influenza vaccination 07/16/2018  . Screening for cardiovascular condition 06/06/2018  . FHx: heart disease 06/06/2018  . Vitamin D deficiency 06/06/2018  . Abnormal  glucose 06/06/2018   . Elevated BP without diagnosis of hypertension 06/06/2018  . Gastroesophageal reflux disease 04/04/2017  . Hx of diverticulitis of colon 04/04/2017  . Hepatic hemangioma, Rt lobe (7 cm) Jan 2017 12/12/2016  . Recurrent UTI 12/12/2016  . Hyperlipidemia, mixed 10/23/2016  . Hypothyroidism 10/23/2016  . Depression 10/23/2016  . Diverticulosis of colon without hemorrhage 10/23/2016   Kerin Perna, PTA 09/19/18 2:11 PM  [place], [name], [address] Phone: [phone]   Fax:  [phone]  Name: Isabel Tyler MRN: [id number] Date of Birth: August 19, 1951

## 2018-09-24 ENCOUNTER — Ambulatory Visit (INDEPENDENT_AMBULATORY_CARE_PROVIDER_SITE_OTHER): Payer: Medicare Other | Admitting: Rehabilitative and Restorative Service Providers"

## 2018-09-24 ENCOUNTER — Encounter: Payer: Self-pay | Admitting: Rehabilitative and Restorative Service Providers"

## 2018-09-24 DIAGNOSIS — M545 Low back pain, unspecified: Secondary | ICD-10-CM

## 2018-09-24 DIAGNOSIS — R29898 Other symptoms and signs involving the musculoskeletal system: Secondary | ICD-10-CM

## 2018-09-24 DIAGNOSIS — M25552 Pain in left hip: Secondary | ICD-10-CM

## 2018-09-24 NOTE — Therapy (Addendum)
[place], [name], [address] Phone: [phone]   Fax:  [phone]  Physical Therapy Treatment  Patient Details  Name: Isabel Tyler MRN: [id number] Date of Birth: Aug 05, 1951 Referring Provider (PT): Dr Dianah Field    Encounter Date: 09/24/2018  PT End of Session - 09/24/18 1020    Visit Number  5    Number of Visits  12    Date for PT Re-Evaluation  10/17/18    PT Start Time  1324    PT Stop Time  1111    PT Time Calculation (min)  56 min    Activity Tolerance  Patient tolerated treatment well       Past Medical History:  Diagnosis Date  . Anxiety   . Diverticular disease   . Hyperlipidemia, mixed   . Hypothyroidism 2000  . Vitamin D deficiency     Past Surgical History:  Procedure Laterality Date  . BREAST BIOPSY Left    benign  . CHOLECYSTECTOMY N/A 02/08/2017   Procedure: LAPAROSCOPIC CHOLECYSTECTOMY WITH INTRAOPERATIVE CHOLANGIOGRAM;  Surgeon: Donnie Mesa, MD;  Location: [facility];  Service: General;  Laterality: N/A;  . EYE SURGERY     Eyelid lifted  . TONSILLECTOMY      There were no vitals filed for this visit.  Subjective Assessment - 09/24/18 1020    Subjective  Lt LB is feeling better - foot still hurts. Has stiffness and pain when she gets up after having been sitting for more than 5-10 min. Hurts with walking 0-45 minutes. Has an appointment with Dr Dianah Field this week to re-check inserts. Working on her exercises at home. Making some progress.     Currently in Pain?  No/denies         [facility] PT Assessment - 09/24/18 0001      Assessment   Medical Diagnosis  Lt LBP     Referring Provider (PT)  Dr Dianah Field     Onset Date/Surgical Date  08/11/18    Hand Dominance  Right    Next MD Visit  09/26/2018    Prior Therapy  none      Observation/Other Assessments   Focus on Therapeutic Outcomes (FOTO)   40% limitation       Sensation   Additional Comments   WFL's per pt report       AROM   Lumbar Flexion  90%    Lumbar Extension  65% discomfort Lt LB     Lumbar - Right Side Bend  75%     Lumbar - Left Side Bend  75%discomfort Lt LB     Lumbar - Right Rotation  50%    Lumbar - Left Rotation  50%      Strength   Overall Strength Comments  WFL's bilat LE's       Flexibility   Hamstrings  WFL's     ITB  WFL's     Piriformis  mild tightness Lt > Rt       Palpation   Palpation comment  muscular tightness through Lt lumbar and posterior hip area - piriformis/gluts                   [facility] Adult PT Treatment/Exercise - 09/24/18 0001      Lumbar Exercises: Stretches   Passive Hamstring Stretch  Left;Right;3 reps;30 seconds    ITB Stretch  Left;2 reps;30 seconds   supine with strap    Piriformis Stretch  Right;Left;2 reps;30 seconds  supine, knee towards opp shoulder    Gastroc Stretch  2 reps;30 seconds   both   Gastroc Stretch Limitations  soleus stretch 30 sec x 2 reps       Lumbar Exercises: Aerobic   Nustep  L5: arms/legs x 6 min        treatment was concluded with MH and IFC e-stim to Lt pumbar to posterior hip area x 15 min             PT Long Term Goals - 09/05/18 1335      PT LONG TERM GOAL #1   Title  Improve mobility and ROM through lumbar spine and bilat hips to WFL's and pain free 10/17/2018    Time  6    Period  Weeks    Status  New      PT LONG TERM GOAL #2   Title  Decrease pain with patient to report returning to normal functional activities and walking program 10/17/2018    Time  6    Period  Weeks    Status  New      PT LONG TERM GOAL #3   Title  Independent in HEP 10/17/2018    Time  6    Period  Weeks    Status  New      PT LONG TERM GOAL #4   Title  Improve FOTO to </= 41% limitation 10/17/2018    Time  6    Period  Weeks    Status  New            Plan - 09/24/18 1020    Clinical Impression Statement  Deb demonstrates some improvement in lumbar and LE mobility;  decrease in frequency and intensity of Lt lumbr ans hip pain. She continues to have some Lt LB and hip discomfort with functional activities and exercises. She has palpable tightness through posterior hip including piriformis and gluts. Will benefit from trial of DN. Lt foot pain continues to conribute to Lt hip/back pain and discomfort.     Rehab Potential  Good    PT Frequency  2x / week    PT Duration  6 weeks    PT Treatment/Interventions  Patient/family education;ADLs/Self Care Home Management;Cryotherapy;Electrical Stimulation;Iontophoresis 4mg /ml Dexamethasone;Moist Heat;Ultrasound;Dry needling;Manual techniques;Neuromuscular re-education;Functional mobility training;Therapeutic activities;Therapeutic exercise    PT Next Visit Plan  continue pelvis/spine stabilization exercises and LE stretches. Trial of DN at next visit. Progress HEP as tolerated.     PT Home Exercise Plan   Access Code: [email or web address]     Consulted and Agree with Plan of Care  Patient       Patient will benefit from skilled therapeutic intervention in order to improve the following deficits and impairments:  Postural dysfunction, Improper body mechanics, Pain, Increased fascial restricitons, Increased muscle spasms, Hypomobility, Decreased mobility, Decreased range of motion, Decreased activity tolerance  Visit Diagnosis: Acute left-sided low back pain without sciatica  Pain in left hip  Other symptoms and signs involving the musculoskeletal system     Problem List Patient Active Problem List   Diagnosis Date Noted  . Plantar fasciitis, left 09/03/2018  . Metatarsalgia of both feet 09/03/2018  . Lumbar spinal stenosis 09/03/2018  . Presbycusis of both ears 07/16/2018  . Need for influenza vaccination 07/16/2018  . Screening for cardiovascular condition 06/06/2018  . FHx: heart disease 06/06/2018  . Vitamin D deficiency 06/06/2018  . Abnormal glucose 06/06/2018  . Elevated BP without diagnosis of hypertension  06/06/2018  .  Gastroesophageal reflux disease 04/04/2017  . Hx of diverticulitis of colon 04/04/2017  . Hepatic hemangioma, Rt lobe (7 cm) Jan 2017 12/12/2016  . Recurrent UTI 12/12/2016  . Hyperlipidemia, mixed 10/23/2016  . Hypothyroidism 10/23/2016  . Depression 10/23/2016  . Diverticulosis of colon without hemorrhage 10/23/2016    Celyn Nilda Simmer PT, MPH  09/24/2018, 12:49 PM  [facility] [place], [name], [address] Phone: [phone]   Fax:  [phone]  Name: Denyce Harr MRN: [id number] Date of Birth: 26-Jan-1951

## 2018-09-26 ENCOUNTER — Ambulatory Visit (INDEPENDENT_AMBULATORY_CARE_PROVIDER_SITE_OTHER): Payer: Medicare Other | Admitting: Rehabilitative and Restorative Service Providers"

## 2018-09-26 ENCOUNTER — Encounter: Payer: Self-pay | Admitting: Rehabilitative and Restorative Service Providers"

## 2018-09-26 ENCOUNTER — Ambulatory Visit (INDEPENDENT_AMBULATORY_CARE_PROVIDER_SITE_OTHER): Payer: Medicare Other | Admitting: Sports Medicine

## 2018-09-26 DIAGNOSIS — M722 Plantar fascial fibromatosis: Secondary | ICD-10-CM | POA: Diagnosis not present

## 2018-09-26 DIAGNOSIS — L989 Disorder of the skin and subcutaneous tissue, unspecified: Secondary | ICD-10-CM | POA: Insufficient documentation

## 2018-09-26 DIAGNOSIS — M25552 Pain in left hip: Secondary | ICD-10-CM | POA: Diagnosis not present

## 2018-09-26 DIAGNOSIS — R29898 Other symptoms and signs involving the musculoskeletal system: Secondary | ICD-10-CM

## 2018-09-26 DIAGNOSIS — M545 Low back pain, unspecified: Secondary | ICD-10-CM

## 2018-09-26 DIAGNOSIS — M65312 Trigger thumb, left thumb: Secondary | ICD-10-CM | POA: Insufficient documentation

## 2018-09-26 MED ORDER — MELOXICAM 15 MG PO TABS: ORAL_TABLET | ORAL | 3 refills | Status: DC

## 2018-09-26 NOTE — Assessment & Plan Note (Signed)
Left-sided, suspect hemangioma. Return for shave biopsy with hyfrecation.

## 2018-09-26 NOTE — Patient Instructions (Signed)

## 2018-09-26 NOTE — Assessment & Plan Note (Signed)
Injection as above. Return as needed for this.

## 2018-09-26 NOTE — Therapy (Signed)
[place], [name], [address] Phone: [phone]   Fax:  [phone]  Physical Therapy Treatment  Patient Details  Name: Isabel Tyler MRN: [id number] Date of Birth: Aug 18, 1951 Referring Provider (PT): Dr Dianah Field    Encounter Date: 09/26/2018  PT End of Session - 09/26/18 1021    Visit Number  6    Number of Visits  12    Date for PT Re-Evaluation  10/17/18    PT Start Time  6962    PT Stop Time  1114    PT Time Calculation (min)  59 min    Activity Tolerance  Patient tolerated treatment well       Past Medical History:  Diagnosis Date  . Anxiety   . Diverticular disease   . Hyperlipidemia, mixed   . Hypothyroidism 2000  . Vitamin D deficiency     Past Surgical History:  Procedure Laterality Date  . BREAST BIOPSY Left    benign  . CHOLECYSTECTOMY N/A 02/08/2017   Procedure: LAPAROSCOPIC CHOLECYSTECTOMY WITH INTRAOPERATIVE CHOLANGIOGRAM;  Surgeon: Donnie Mesa, MD;  Location: [facility];  Service: General;  Laterality: N/A;  . EYE SURGERY     Eyelid lifted  . TONSILLECTOMY      There were no vitals filed for this visit.  Subjective Assessment - 09/26/18 1021    Subjective  Patient reports that she saw Dr T this am and he adjusted the insert for her shoe. She is to continue with PT for a few weeks. Pt reports that she continues to have some soreness in the Lt hip and pain in the Lt foot. She has been working on her exercises at home. Anxious to try the DN today.     Currently in Pain?  Yes    Pain Score  3     Pain Location  Back    Pain Orientation  Left;Lower    Pain Descriptors / Indicators  Aching;Dull;Throbbing    Pain Type  Acute pain    Pain Radiating Towards  into Lt hip and thight not below the knee     Pain Onset  More than a month ago    Pain Frequency  Intermittent                       [facility] Adult PT Treatment/Exercise - 09/26/18 0001      Lumbar Exercises: Stretches   Passive Hamstring Stretch  Left;Right;3 reps;30 seconds    ITB Stretch  Left;2 reps;30 seconds   supine with strap    Piriformis Stretch  Right;Left;2 reps;30 seconds   supine, knee towards opp shoulder    Gastroc Stretch  2 reps;30 seconds   both   Gastroc Stretch Limitations  soleus stretch 30 sec x 2 reps       Lumbar Exercises: Aerobic   Nustep  L5: arms/legs x 6 min       Lumbar Exercises: Supine   Bridge  10 reps;3 seconds      Moist Heat Therapy   Number Minutes Moist Heat  20 Minutes    Moist Heat Location  Lumbar Spine;Hip      Electrical Stimulation   Electrical Stimulation Location  Lt posterior hip     Electrical Stimulation Action  IFC    Electrical Stimulation Parameters  To tolerance    Electrical Stimulation Goals  Pain;Tone      Manual Therapy   Soft tissue mobilization  deep tissue work  through the Lt posterior hip into piriformis and gluts; IASTM and deep tissue work through Belk plantar surface into the calcaneous area    Kinesiotex  Inhibit Muscle       Trigger Point Dry Needling - 09/26/18 1213    Consent Given?  Yes    Education Handout Provided  Yes    Muscles Treated Lower Body  Piriformis;Gluteus minimus;Gluteus maximus   Lt    Gluteus Maximus Response  Palpable increased muscle length    Gluteus Minimus Response  Palpable increased muscle length    Piriformis Response  Palpable increased muscle length           PT Education - 09/26/18 1039    Education Details  DN     Person(s) Educated  Patient    Methods  Explanation    Comprehension  Verbalized understanding          PT Long Term Goals - 09/05/18 1335      PT LONG TERM GOAL #1   Title  Improve mobility and ROM through lumbar spine and bilat hips to WFL's and pain free 10/17/2018    Time  6    Period  Weeks    Status  New      PT LONG TERM GOAL #2   Title  Decrease pain with patient to report returning to normal functional activities and walking  program 10/17/2018    Time  6    Period  Weeks    Status  New      PT LONG TERM GOAL #3   Title  Independent in HEP 10/17/2018    Time  6    Period  Weeks    Status  New      PT LONG TERM GOAL #4   Title  Improve FOTO to </= 41% limitation 10/17/2018    Time  6    Period  Weeks    Status  New            Plan - 09/26/18 1021    Clinical Impression Statement  Continued muscular tightness through the Lt posterior hip - piriformis and gluts - responded well to DN and manual work. IASTM and soft tissue work through the plantar surface. Gradually progressing toward stated goals of therapy.     Rehab Potential  Good    PT Frequency  2x / week    PT Duration  6 weeks    PT Treatment/Interventions  Patient/family education;ADLs/Self Care Home Management;Cryotherapy;Electrical Stimulation;Iontophoresis 4mg /ml Dexamethasone;Moist Heat;Ultrasound;Dry needling;Manual techniques;Neuromuscular re-education;Functional mobility training;Therapeutic activities;Therapeutic exercise    PT Next Visit Plan  continue pelvis/spine stabilization exercises and LE stretches. Trial of DN at next visit. Progress HEP as tolerated.     PT Home Exercise Plan   Access Code: [email or web address]     Consulted and Agree with Plan of Care  Patient       Patient will benefit from skilled therapeutic intervention in order to improve the following deficits and impairments:  Postural dysfunction, Improper body mechanics, Pain, Increased fascial restricitons, Increased muscle spasms, Hypomobility, Decreased mobility, Decreased range of motion, Decreased activity tolerance  Visit Diagnosis: Acute left-sided low back pain without sciatica  Pain in left hip  Other symptoms and signs involving the musculoskeletal system     Problem List Patient Active Problem List   Diagnosis Date Noted  . Trigger thumb, left thumb 09/26/2018  . Skin lesion of wrist 09/26/2018  . Plantar fasciitis, left 09/03/2018  . Metatarsalgia of both  feet  09/03/2018  . Lumbar spinal stenosis 09/03/2018  . Presbycusis of both ears 07/16/2018  . Need for influenza vaccination 07/16/2018  . Screening for cardiovascular condition 06/06/2018  . FHx: heart disease 06/06/2018  . Vitamin D deficiency 06/06/2018  . Abnormal glucose 06/06/2018  . Elevated BP without diagnosis of hypertension 06/06/2018  . Gastroesophageal reflux disease 04/04/2017  . Hx of diverticulitis of colon 04/04/2017  . Hepatic hemangioma, Rt lobe (7 cm) Jan 2017 12/12/2016  . Recurrent UTI 12/12/2016  . Hyperlipidemia, mixed 10/23/2016  . Hypothyroidism 10/23/2016  . Depression 10/23/2016  . Diverticulosis of colon without hemorrhage 10/23/2016    Florance Paolillo Nilda Simmer PT, MPH  09/26/2018, 12:21 PM  [facility] [place], [name], [address] Phone: [phone]   Fax:  [phone]  Name: Isabel Tyler MRN: [id number] Date of Birth: [birth year]

## 2018-09-26 NOTE — Progress Notes (Signed)
Subjective:    CC: Orthotic issue, thumb pain  HPI: Thumb pain: This is a pleasant 68 year old female, for the past several weeks to months she is had pain that she localizes on the volar aspect of her left thumb, occasional locking in the flex position at the IP joint.  Symptoms are moderate, persistent, localized without radiation.  Plantar fasciitis: Improving slightly, she does feel as though her orthotic does not fit in all of her shoes and would like me to modify them.  She does feel as though is a bit too firm as well.  Noticed a lesion on her volar left wrist.  This is been here for years but has recently started to grow.  I reviewed the past medical history, family history, social history, surgical history, and allergies today and no changes were needed.  Please see the problem list section below in epic for further details.  Past Medical History: Past Medical History:  Diagnosis Date  . Anxiety   . Diverticular disease   . Hyperlipidemia, mixed   . Hypothyroidism 2000  . Vitamin D deficiency    Past Surgical History: Past Surgical History:  Procedure Laterality Date  . BREAST BIOPSY Left    benign  . CHOLECYSTECTOMY N/A 02/08/2017   Procedure: LAPAROSCOPIC CHOLECYSTECTOMY WITH INTRAOPERATIVE CHOLANGIOGRAM;  Surgeon: Donnie Mesa, MD;  Location: [facility];  Service: General;  Laterality: N/A;  . EYE SURGERY     Eyelid lifted  . TONSILLECTOMY     Social History: Social History   Socioeconomic History  . Marital status: Married    Spouse name: Not on file  . Number of children: Not on file  . Years of education: Not on file  . Highest education level: Not on file  Occupational History  . Not on file  Social Needs  . Financial resource strain: Not on file  . Food insecurity:    Worry: Not on file    Inability: Not on file  . Transportation needs:    Medical: Not on file    Non-medical: Not on file  Tobacco Use  . Smoking status: Never Smoker  . Smokeless  tobacco: Never Used  Substance and Sexual Activity  . Alcohol use: Yes    Comment: occasional   . Drug use: No  . Sexual activity: Yes  Lifestyle  . Physical activity:    Days per week: Not on file    Minutes per session: Not on file  . Stress: Not on file  Relationships  . Social connections:    Talks on phone: Not on file    Gets together: Not on file    Attends religious service: Not on file    Active member of club or organization: Not on file    Attends meetings of clubs or organizations: Not on file    Relationship status: Not on file  Other Topics Concern  . Not on file  Social History Narrative  . Not on file   Family History: Family History  Problem Relation Age of Onset  . Heart disease Mother   . Heart disease Father   . Breast cancer Sister    Allergies: Allergies  Allergen Reactions  . Ciprofloxacin Other (See Comments)    Alleged tendonitis   Medications: See med rec.  Review of Systems: No fevers, chills, night sweats, weight loss, chest pain, or shortness of breath.   Objective:    General: Well Developed, well nourished, and in no acute distress.  Neuro: Alert and  oriented x3, extra-ocular muscles intact, sensation grossly intact.  HEENT: Normocephalic, atraumatic, pupils equal round reactive to light, neck supple, no masses, no lymphadenopathy, thyroid nonpalpable.  Skin: Warm and dry, no rashes.  There is a 1 cm dome-shaped lesion on the volar left wrist.  It is purplish reddish in color. Cardiac: Regular rate and rhythm, no murmurs rubs or gallops, no lower extremity edema.  Respiratory: Clear to auscultation bilaterally. Not using accessory muscles, speaking in full sentences. Left hand: Palpable nodule at the flexor pollicis longus.  Tender to palpation.  Procedure: Real-time Ultrasound Guided Injection of left flexor pollicis longus tendon sheath Device: GE Logiq E  Verbal informed consent obtained.  Time-out conducted.  Noted no overlying  erythema, induration, or other signs of local infection.  Skin prepped in a sterile fashion.  Local anesthesia: Topical Ethyl chloride.  With sterile technique and under real time ultrasound guidance: 1/2 cc  Kenalog 40, 1/2 cc lidocaine injected easily Completed without difficulty  Pain immediately resolved suggesting accurate placement of the medication.  Advised to call if fevers/chills, erythema, induration, drainage, or persistent bleeding.  Images permanently stored and available for review in the ultrasound unit.  Impression: Technically successful ultrasound guided injection.  Orthotic was planed down, it fit in all of her shoes.  Impression and Recommendations:    Plantar fasciitis, left Slight improvements but continued pain. I planed down the tip of her orthotics today. I would like her to come back and I am going to build her a new set of orthotics with a softer light blue compound, I will not bill for these. Switching to meloxicam.  Trigger thumb, left thumb Injection as above. Return as needed for this.  Skin lesion of wrist Left-sided, suspect hemangioma. Return for shave biopsy with hyfrecation.  I spent 40 minutes with this patient, greater than 50% was face-to-face time counseling regarding the above diagnoses, this was separate from the time spent performing the above procedure.  Specifically we discussed orthotics, anticipatory guidance regarding plantar fasciitis. ___________________________________________ Gwen Her. Dianah Field, M.D., ABFM., CAQSM. Primary Care and Sports Medicine [facility]  Adjunct Professor of [employer] [facility] [employer]

## 2018-09-26 NOTE — Assessment & Plan Note (Signed)
Slight improvements but continued pain. I planed down the tip of her orthotics today. I would like her to come back and I am going to build her a new set of orthotics with a softer light blue compound, I will not bill for these. Switching to meloxicam.

## 2018-10-01 ENCOUNTER — Ambulatory Visit (INDEPENDENT_AMBULATORY_CARE_PROVIDER_SITE_OTHER): Payer: Medicare Other | Admitting: Rehabilitative and Restorative Service Providers"

## 2018-10-01 ENCOUNTER — Encounter: Payer: Self-pay | Admitting: Rehabilitative and Restorative Service Providers"

## 2018-10-01 ENCOUNTER — Ambulatory Visit: Payer: Medicare Other | Admitting: Sports Medicine

## 2018-10-01 DIAGNOSIS — M545 Low back pain, unspecified: Secondary | ICD-10-CM

## 2018-10-01 DIAGNOSIS — L989 Disorder of the skin and subcutaneous tissue, unspecified: Secondary | ICD-10-CM

## 2018-10-01 DIAGNOSIS — M722 Plantar fascial fibromatosis: Secondary | ICD-10-CM | POA: Diagnosis not present

## 2018-10-01 DIAGNOSIS — M25552 Pain in left hip: Secondary | ICD-10-CM

## 2018-10-01 DIAGNOSIS — L980 Pyogenic granuloma: Secondary | ICD-10-CM | POA: Diagnosis not present

## 2018-10-01 DIAGNOSIS — R29898 Other symptoms and signs involving the musculoskeletal system: Secondary | ICD-10-CM

## 2018-10-01 NOTE — Therapy (Signed)
[place], [name], [address] Phone: [phone]   Fax:  [phone]  Physical Therapy Treatment  Patient Details  Name: Isabel Tyler MRN: [id number] Date of Birth: 1951/03/22 Referring Provider (PT): Dr Dianah Field    Encounter Date: 10/01/2018  PT End of Session - 10/01/18 1016    Visit Number  7    Number of Visits  12    Date for PT Re-Evaluation  10/17/18    PT Start Time  1015    PT Stop Time  1115    PT Time Calculation (min)  60 min    Activity Tolerance  Patient tolerated treatment well       Past Medical History:  Diagnosis Date  . Anxiety   . Diverticular disease   . Hyperlipidemia, mixed   . Hypothyroidism 2000  . Vitamin D deficiency     Past Surgical History:  Procedure Laterality Date  . BREAST BIOPSY Left    benign  . CHOLECYSTECTOMY N/A 02/08/2017   Procedure: LAPAROSCOPIC CHOLECYSTECTOMY WITH INTRAOPERATIVE CHOLANGIOGRAM;  Surgeon: Donnie Mesa, MD;  Location: [facility];  Service: General;  Laterality: N/A;  . EYE SURGERY     Eyelid lifted  . TONSILLECTOMY      There were no vitals filed for this visit.  Subjective Assessment - 10/01/18 1016    Subjective  Patient reports that the DN was very helpful. She has less pain and feels much better.     Currently in Pain?  No/denies    Pain Score  0-No pain                       [facility] Adult PT Treatment/Exercise - 10/01/18 0001      Lumbar Exercises: Stretches   Passive Hamstring Stretch  Left;Right;3 reps;30 seconds    ITB Stretch  Left;2 reps;30 seconds   supine with strap    Piriformis Stretch  Right;Left;2 reps;30 seconds   supine, knee towards opp shoulder      Lumbar Exercises: Aerobic   Nustep  L5: arms/legs x 6 min       Lumbar Exercises: Standing   Wall Slides  10 reps;5 seconds   with ball    Other Standing Lumbar Exercises  step ups x 10 -  6 inch  step       Lumbar Exercises:  Supine   Clam  10 reps   alternating one leg out then the other green TB    Bridge  10 reps;3 seconds      Moist Heat Therapy   Number Minutes Moist Heat  20 Minutes    Moist Heat Location  Lumbar Spine;Hip      Electrical Stimulation   Electrical Stimulation Location  Lt posterior hip     Electrical Stimulation Action  IFC    Electrical Stimulation Parameters  to tolerance    Electrical Stimulation Goals  Pain;Tone      Manual Therapy   Soft tissue mobilization  deep tissue work through the Lt posterior hip into piriformis and gluts; IASTM and deep tissue work through [employer] plantar surface into the calcaneous area       Trigger Point Dry Needling - 10/01/18 1059    Consent Given?  Yes    Muscles Treated Lower Body  --   Lt    Gluteus Maximus Response  Palpable increased muscle length    Gluteus Minimus Response  Palpable increased muscle length  Piriformis Response  Palpable increased muscle length                PT Long Term Goals - 09/05/18 1335      PT LONG TERM GOAL #1   Title  Improve mobility and ROM through lumbar spine and bilat hips to WFL's and pain free 10/17/2018    Time  6    Period  Weeks    Status  New      PT LONG TERM GOAL #2   Title  Decrease pain with patient to report returning to normal functional activities and walking program 10/17/2018    Time  6    Period  Weeks    Status  New      PT LONG TERM GOAL #3   Title  Independent in HEP 10/17/2018    Time  6    Period  Weeks    Status  New      PT LONG TERM GOAL #4   Title  Improve FOTO to </= 41% limitation 10/17/2018    Time  6    Period  Weeks    Status  New            Plan - 10/01/18 1016    Clinical Impression Statement  Good response to DN and manual work. Patient added strengthening exercises without difficulty. Foot is also improving. Modifications in insert helped and the tape helps with pain. May try weaning from tape this week. Has appointment to check oup a local gym.  Progressing well toward stated goals of therapy.     Rehab Potential  Good    PT Frequency  2x / week    PT Duration  6 weeks    PT Treatment/Interventions  Patient/family education;ADLs/Self Care Home Management;Cryotherapy;Electrical Stimulation;Iontophoresis 4mg /ml Dexamethasone;Moist Heat;Ultrasound;Dry needling;Manual techniques;Neuromuscular re-education;Functional mobility training;Therapeutic activities;Therapeutic exercise    PT Next Visit Plan  continue pelvis/spine stabilization exercises and LE stretches. assess tresponse to second session of. Progress HEP as tolerated.     PT Home Exercise Plan   Access Code: [email or web address]     Consulted and Agree with Plan of Care  Patient       Patient will benefit from skilled therapeutic intervention in order to improve the following deficits and impairments:  Postural dysfunction, Improper body mechanics, Pain, Increased fascial restricitons, Increased muscle spasms, Hypomobility, Decreased mobility, Decreased range of motion, Decreased activity tolerance  Visit Diagnosis: Acute left-sided low back pain without sciatica  Pain in left hip  Other symptoms and signs involving the musculoskeletal system     Problem List Patient Active Problem List   Diagnosis Date Noted  . Trigger thumb, left thumb 09/26/2018  . Skin lesion of wrist 09/26/2018  . Plantar fasciitis, left 09/03/2018  . Metatarsalgia of both feet 09/03/2018  . Lumbar spinal stenosis 09/03/2018  . Presbycusis of both ears 07/16/2018  . Need for influenza vaccination 07/16/2018  . Screening for cardiovascular condition 06/06/2018  . FHx: heart disease 06/06/2018  . Vitamin D deficiency 06/06/2018  . Abnormal glucose 06/06/2018  . Elevated BP without diagnosis of hypertension 06/06/2018  . Gastroesophageal reflux disease 04/04/2017  . Hx of diverticulitis of colon 04/04/2017  . Hepatic hemangioma, Rt lobe (7 cm) Jan 2017 12/12/2016  . Recurrent UTI 12/12/2016  .  Hyperlipidemia, mixed 10/23/2016  . Hypothyroidism 10/23/2016  . Depression 10/23/2016  . Diverticulosis of colon without hemorrhage 10/23/2016    Syncere Eble Nilda Simmer PT, MPH  10/01/2018, 11:03 AM  [facility] [place] [address] [place], [name], [address] Phone: [phone]   Fax:  [phone]  Name: Vaniah Chambers MRN: [id number] Date of Birth: [birth year]

## 2018-10-01 NOTE — Assessment & Plan Note (Signed)
Shave biopsy, hyfrecation. Return in 2 weeks for a wound check.

## 2018-10-01 NOTE — Addendum Note (Signed)
Addended by: Beatris Ship L on: 10/01/2018 04:05 PM   Modules accepted: Orders

## 2018-10-01 NOTE — Assessment & Plan Note (Signed)
New set of custom orthotics, these were done free of charge.

## 2018-10-01 NOTE — Progress Notes (Signed)
    Patient was fitted for a : standard, cushioned, semi-rigid orthotic. The orthotic was heated and afterward the patient stood on the orthotic blank positioned on the orthotic stand. The patient was positioned in subtalar neutral position and 10 degrees of ankle dorsiflexion in a weight bearing stance. After completion of molding, a stable base was applied to the orthotic blank. The blank was ground to a stable position for weight bearing. Size: 7, trimmed Base: White EVA Additional Posting and Padding: None The patient ambulated these, and they were very comfortable.  Procedure:  Excision of left volar forearm skin lesion, 0.7cm. Risks, benefits, and alternatives explained and consent obtained. Time out conducted. Surface prepped with alcohol. 1cc lidocaine with epinephine infiltrated in a field block. Adequate anesthesia ensured. Area prepped and draped in a sterile fashion. Excision performed with: I shaved the lesion with a derma blade and then used the Hyfrecator for aggressive hemostasis Hemostasis achieved. Pt stable.  ___________________________________________ Gwen Her. Dianah Field, M.D., ABFM., CAQSM. Primary Care and [place] Instructor of [employer] [facility] [employer]

## 2018-10-03 ENCOUNTER — Ambulatory Visit: Payer: Medicare Other | Admitting: Sports Medicine

## 2018-10-03 ENCOUNTER — Ambulatory Visit (INDEPENDENT_AMBULATORY_CARE_PROVIDER_SITE_OTHER): Payer: Medicare Other | Admitting: Rehabilitative and Restorative Service Providers"

## 2018-10-03 ENCOUNTER — Encounter: Payer: Self-pay | Admitting: Rehabilitative and Restorative Service Providers"

## 2018-10-03 DIAGNOSIS — M545 Low back pain, unspecified: Secondary | ICD-10-CM

## 2018-10-03 DIAGNOSIS — M25552 Pain in left hip: Secondary | ICD-10-CM

## 2018-10-03 DIAGNOSIS — R29898 Other symptoms and signs involving the musculoskeletal system: Secondary | ICD-10-CM

## 2018-10-03 NOTE — Therapy (Signed)
[place], [name], [address] Phone: [phone]   Fax:  [phone]  Physical Therapy Treatment  Patient Details  Name: Isabel Tyler MRN: [id number] Date of Birth: 03/15/1951 Referring Provider (PT): Dr Dianah Field    Encounter Date: 10/03/2018  PT End of Session - 10/03/18 0938    Visit Number  8    Number of Visits  12    Date for PT Re-Evaluation  10/17/18    PT Start Time  0936    PT Stop Time  1031    PT Time Calculation (min)  55 min    Activity Tolerance  Patient tolerated treatment well       Past Medical History:  Diagnosis Date  . Anxiety   . Diverticular disease   . Hyperlipidemia, mixed   . Hypothyroidism 2000  . Vitamin D deficiency     Past Surgical History:  Procedure Laterality Date  . BREAST BIOPSY Left    benign  . CHOLECYSTECTOMY N/A 02/08/2017   Procedure: LAPAROSCOPIC CHOLECYSTECTOMY WITH INTRAOPERATIVE CHOLANGIOGRAM;  Surgeon: Donnie Mesa, MD;  Location: [facility];  Service: General;  Laterality: N/A;  . EYE SURGERY     Eyelid lifted  . TONSILLECTOMY      There were no vitals filed for this visit.  Subjective Assessment - 10/03/18 0939    Subjective  Patient reports that she continues to improve. She does think that tthe dry needling is helping a lot     Currently in Pain?  No/denies         [facility] PT Assessment - 10/03/18 0001      Assessment   Medical Diagnosis  Lt LBP     Referring Provider (PT)  Dr Dianah Field     Onset Date/Surgical Date  08/11/18    Hand Dominance  Right    Next MD Visit  09/26/2018    Prior Therapy  none      Observation/Other Assessments   Focus on Therapeutic Outcomes (FOTO)   26% limitation       AROM   Lumbar Flexion  90%    Lumbar Extension  70%    Lumbar - Right Side Bend  75%    Lumbar - Left Side Bend  75%    Lumbar - Right Rotation  50%    Lumbar - Left Rotation  50%      Strength   Overall Strength  Comments  WFL's bilat LE's       Flexibility   Hamstrings  WFL's     ITB  WFL's     Piriformis  mild tightness Lt > Rt       Palpation   Palpation comment  minimal muscular tightness through Lt lumbar and posterior hip area - piriformis/gluts                   [facility] Adult PT Treatment/Exercise - 10/03/18 0001      Lumbar Exercises: Stretches   ITB Stretch  Left;2 reps;30 seconds   supine with strap    Piriformis Stretch  Right;Left;2 reps;30 seconds   supine, knee towards opp shoulder      Lumbar Exercises: Aerobic   Nustep  L5: arms/legs x 6 min       Moist Heat Therapy   Number Minutes Moist Heat  20 Minutes    Moist Heat Location  Lumbar Spine;Hip      Electrical Stimulation   Electrical Stimulation Location  Lt posterior hip  Electrical Stimulation Action  IFC    Electrical Stimulation Parameters  to tolerance    Electrical Stimulation Goals  Pain;Tone      Manual Therapy   Soft tissue mobilization  deep tissue work through the Lt posterior hip into piriformis and gluts; IASTM and deep tissue work through [employer] plantar surface into the calcaneous area       [employer] Needling - 10/03/18 0957    Consent Given?  Yes    Muscles Treated Lower Body  --   Lt with estim    Gluteus Maximus Response  Palpable increased muscle length    Gluteus Minimus Response  Palpable increased muscle length    Piriformis Response  Palpable increased muscle length                PT Long Term Goals - 10/03/18 1022      PT LONG TERM GOAL #1   Title  Improve mobility and ROM through lumbar spine and bilat hips to WFL's and pain free 10/17/2018    Time  6    Period  Weeks    Status  Achieved      PT LONG TERM GOAL #2   Title  Decrease pain with patient to report returning to normal functional activities and walking program 10/17/2018    Time  6    Period  Weeks    Status  Achieved      PT LONG TERM GOAL #3   Title  Independent in HEP 10/17/2018    Time   6    Period  Weeks    Status  Achieved      PT LONG TERM GOAL #4   Title  Improve FOTO to </= 41% limitation 10/17/2018    Time  6    Period  Weeks    Status  Achieved            Plan - 10/03/18 1607    Clinical Impression Statement  Excellent progress. Patient pleased with progress and has accomplished goals. She will continue with independent HEP and call with any questions or problems.     Rehab Potential  Good    PT Frequency  2x / week    PT Duration  6 weeks    PT Treatment/Interventions  Patient/family education;ADLs/Self Care Home Management;Cryotherapy;Electrical Stimulation;Iontophoresis 4mg /ml Dexamethasone;Moist Heat;Ultrasound;Dry needling;Manual techniques;Neuromuscular re-education;Functional mobility training;Therapeutic activities;Therapeutic exercise    PT Next Visit Plan  continue with HEP - hold x 2-3 wks    PT Home Exercise Plan   Access Code: [email or web address]     Consulted and Agree with Plan of Care  Patient       Patient will benefit from skilled therapeutic intervention in order to improve the following deficits and impairments:  Postural dysfunction, Improper body mechanics, Pain, Increased fascial restricitons, Increased muscle spasms, Hypomobility, Decreased mobility, Decreased range of motion, Decreased activity tolerance  Visit Diagnosis: Acute left-sided low back pain without sciatica  Pain in left hip  Other symptoms and signs involving the musculoskeletal system     Problem List Patient Active Problem List   Diagnosis Date Noted  . Trigger thumb, left thumb 09/26/2018  . Skin lesion of wrist 09/26/2018  . Plantar fasciitis, left 09/03/2018  . Metatarsalgia of both feet 09/03/2018  . Lumbar spinal stenosis 09/03/2018  . Presbycusis of both ears 07/16/2018  . Need for influenza vaccination 07/16/2018  . Screening for cardiovascular condition 06/06/2018  . FHx: heart disease 06/06/2018  . Vitamin D  deficiency 06/06/2018  . Abnormal glucose  06/06/2018  . Elevated BP without diagnosis of hypertension 06/06/2018  . Gastroesophageal reflux disease 04/04/2017  . Hx of diverticulitis of colon 04/04/2017  . Hepatic hemangioma, Rt lobe (7 cm) Jan 2017 12/12/2016  . Recurrent UTI 12/12/2016  . Hyperlipidemia, mixed 10/23/2016  . Hypothyroidism 10/23/2016  . Depression 10/23/2016  . Diverticulosis of colon without hemorrhage 10/23/2016    Eliza Green Nilda Simmer PT, MPH  10/03/2018, 10:24 AM  [facility] [place], [name], [address] Phone: [phone]   Fax:  [phone]  Name: Isabel Tyler MRN: [id number] Date of Birth: Jul 22, 1951

## 2018-10-15 ENCOUNTER — Ambulatory Visit: Payer: Medicare Other | Admitting: Sports Medicine

## 2018-10-15 ENCOUNTER — Ambulatory Visit (INDEPENDENT_AMBULATORY_CARE_PROVIDER_SITE_OTHER): Payer: Medicare Other | Admitting: Sports Medicine

## 2018-10-15 ENCOUNTER — Encounter: Payer: Self-pay | Admitting: Sports Medicine

## 2018-10-15 DIAGNOSIS — L989 Disorder of the skin and subcutaneous tissue, unspecified: Secondary | ICD-10-CM

## 2018-10-15 DIAGNOSIS — M722 Plantar fascial fibromatosis: Secondary | ICD-10-CM

## 2018-10-15 NOTE — Assessment & Plan Note (Signed)
Pyogenic granuloma. Biopsy site is healing well.

## 2018-10-15 NOTE — Assessment & Plan Note (Signed)
There are some improvements, she still has significant discomfort, she does have several sets of orthotics, she is doing her exercises. Adding some sessions of formal PT for topical modalities. I also added the ear heel brace. Return in 1 month, injection if no better.

## 2018-10-15 NOTE — Progress Notes (Signed)
Subjective:    CC: Recheck foot  HPI: Biopsy site: Pathology was a pyogenic granuloma, healing well.  Left plantar fasciitis: Improvement with multiple sets of orthotics, home rehab exercises, she would like to do some physical therapy sessions.  Persistent discomfort when walking.  Not yet ready to consider injection.  I reviewed the past medical history, family history, social history, surgical history, and allergies today and no changes were needed.  Please see the problem list section below in epic for further details.  Past Medical History: Past Medical History:  Diagnosis Date  . Anxiety   . Diverticular disease   . Hyperlipidemia, mixed   . Hypothyroidism 2000  . Vitamin D deficiency    Past Surgical History: Past Surgical History:  Procedure Laterality Date  . BREAST BIOPSY Left    benign  . CHOLECYSTECTOMY N/A 02/08/2017   Procedure: LAPAROSCOPIC CHOLECYSTECTOMY WITH INTRAOPERATIVE CHOLANGIOGRAM;  Surgeon: Donnie Mesa, MD;  Location: [facility];  Service: General;  Laterality: N/A;  . EYE SURGERY     Eyelid lifted  . TONSILLECTOMY     Social History: Social History   Socioeconomic History  . Marital status: Married    Spouse name: Not on file  . Number of children: Not on file  . Years of education: Not on file  . Highest education level: Not on file  Occupational History  . Not on file  Social Needs  . Financial resource strain: Not on file  . Food insecurity:    Worry: Not on file    Inability: Not on file  . Transportation needs:    Medical: Not on file    Non-medical: Not on file  Tobacco Use  . Smoking status: Never Smoker  . Smokeless tobacco: Never Used  Substance and Sexual Activity  . Alcohol use: Yes    Comment: occasional   . Drug use: No  . Sexual activity: Yes  Lifestyle  . Physical activity:    Days per week: Not on file    Minutes per session: Not on file  . Stress: Not on file  Relationships  . Social connections:    Talks on  phone: Not on file    Gets together: Not on file    Attends religious service: Not on file    Active member of club or organization: Not on file    Attends meetings of clubs or organizations: Not on file    Relationship status: Not on file  Other Topics Concern  . Not on file  Social History Narrative  . Not on file   Family History: Family History  Problem Relation Age of Onset  . Heart disease Mother   . Heart disease Father   . Breast cancer Sister    Allergies: Allergies  Allergen Reactions  . Ciprofloxacin Other (See Comments)    Alleged tendonitis   Medications: See med rec.  Review of Systems: No fevers, chills, night sweats, weight loss, chest pain, or shortness of breath.   Objective:    General: Well Developed, well nourished, and in no acute distress.  Neuro: Alert and oriented x3, extra-ocular muscles intact, sensation grossly intact.  HEENT: Normocephalic, atraumatic, pupils equal round reactive to light, neck supple, no masses, no lymphadenopathy, thyroid nonpalpable.  Skin: Warm and dry, no rashes.  Biopsy site of left thumb is healing well. Cardiac: Regular rate and rhythm, no murmurs rubs or gallops, no lower extremity edema.  Respiratory: Clear to auscultation bilaterally. Not using accessory muscles, speaking in full  sentences. Left foot: No visible erythema or swelling. Range of motion is full in all directions. Strength is 5/5 in all directions. No hallux valgus. No pes cavus or pes planus. No abnormal callus noted. No pain over the navicular prominence, or base of fifth metatarsal. Mild tenderness to palpation of the calcaneal insertion of plantar fascia. No pain at the Achilles insertion. No pain over the calcaneal bursa. No pain of the retrocalcaneal bursa. No tenderness to palpation over the tarsals, metatarsals, or phalanges. No hallux rigidus or limitus. No tenderness palpation over interphalangeal joints. No pain with compression of the  metatarsal heads. Neurovascularly intact distally.  Air heel brace applied.  Impression and Recommendations:    Skin lesion of wrist Pyogenic granuloma. Biopsy site is healing well.  Plantar fasciitis, left There are some improvements, she still has significant discomfort, she does have several sets of orthotics, she is doing her exercises. Adding some sessions of formal PT for topical modalities. I also added the ear heel brace. Return in 1 month, injection if no better.  I spent 25 minutes with this patient, greater than 50% was face-to-face time counseling regarding the above diagnoses, specifically diagnosis, treatment options and prognosis of plantar fasciitis, we went through the other nonsurgical/nonacute options. ___________________________________________ Gwen Her. Dianah Field, M.D., ABFM., CAQSM. Primary Care and Sports Medicine [facility]  Adjunct Professor of [employer] [facility] [employer]

## 2018-10-18 ENCOUNTER — Ambulatory Visit (INDEPENDENT_AMBULATORY_CARE_PROVIDER_SITE_OTHER): Payer: Medicare Other | Admitting: Physical Therapy

## 2018-10-18 ENCOUNTER — Encounter: Payer: Self-pay | Admitting: Physical Therapy

## 2018-10-18 DIAGNOSIS — M79672 Pain in left foot: Secondary | ICD-10-CM

## 2018-10-18 DIAGNOSIS — R252 Cramp and spasm: Secondary | ICD-10-CM | POA: Diagnosis not present

## 2018-10-18 DIAGNOSIS — R2689 Other abnormalities of gait and mobility: Secondary | ICD-10-CM

## 2018-10-18 NOTE — Therapy (Signed)
Isabel [place], [name], [address] Phone: [phone]   Fax:  [phone]  Physical Therapy Re-Evaluation  Patient Details  Name: Isabel Tyler MRN: [id number] Date of Birth: [birth year] Referring Provider (PT): Dr Dianah Field    Encounter Date: 10/18/2018  PT End of Session - 10/18/18 1251    Visit Number  9    Number of Visits  18    Date for PT Re-Evaluation  11/29/18    PT Start Time  2774    PT Stop Time  1100    PT Time Calculation (min)  45 min    Activity Tolerance  Patient tolerated treatment well    Behavior During Therapy  [facility] for tasks assessed/performed       Past Medical History:  Diagnosis Date  . Anxiety   . Diverticular disease   . Hyperlipidemia, mixed   . Hypothyroidism 2000  . Vitamin D deficiency     Past Surgical History:  Procedure Laterality Date  . BREAST BIOPSY Left    benign  . CHOLECYSTECTOMY N/A 02/08/2017   Procedure: LAPAROSCOPIC CHOLECYSTECTOMY WITH INTRAOPERATIVE CHOLANGIOGRAM;  Surgeon: Donnie Mesa, MD;  Location: [facility];  Service: General;  Laterality: N/A;  . EYE SURGERY     Eyelid lifted  . TONSILLECTOMY      There were no vitals filed for this visit.   Subjective Assessment - 10/18/18 1011    Subjective  Pt arrives to PT today with new referral Lt plantar fasciitis.  Pt reports increased pain with walking, especially after 1 mile.      Pertinent History  arthritis; osteoporosis     How long can you sit comfortably?  pain with first few steps after sitting or sleeping    Patient Stated Goals  get rid of the foot/ankle pain    Currently in Pain?  Yes    Pain Score  0-No pain   up to 6/10   Pain Location  Heel    Pain Orientation  Left    Pain Descriptors / Indicators  Shooting;Sharp    Pain Type  Chronic pain    Pain Onset  More than a month ago    Pain Frequency  Intermittent    Aggravating Factors   first few steps after immobility; after  walking 1 mile    Pain Relieving Factors  walking short distances, orthotics         [facility] PT Assessment - 10/18/18 1016      Assessment   Medical Diagnosis  Lt Plantar Fasciitis    Referring Provider (PT)  Dr Dianah Field     Onset Date/Surgical Date  08/11/18    Next MD Visit  11/12/2018    Prior Therapy  none      Precautions   Precautions  None      Restrictions   Weight Bearing Restrictions  No      Balance Screen   Has the patient fallen in the past 6 months  No    Has the patient had a decrease in activity level because of a fear of falling?   No    Is the patient reluctant to leave their home because of a fear of falling?   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     Leisure  caring for 2 yr old granddaughter 2 days/wk; househould chores; walking 2 miles/day 3 days/wk; low impact aerobic tape       Observation/Other Assessments   Observations  Lt foot in pronation    Focus on Therapeutic Outcomes (FOTO)   49 (51% limited; predicted 34% limited)      AROM   AROM Assessment Site  Ankle    Right/Left Ankle  Right;Left    Right Ankle Dorsiflexion  6    Right Ankle Plantar Flexion  64    Right Ankle Inversion  40    Right Ankle Eversion  25    Left Ankle Dorsiflexion  3    Left Ankle Plantar Flexion  60    Left Ankle Inversion  25    Left Ankle Eversion  15      PROM   PROM Assessment Site  Ankle    Right/Left Ankle  Left    Left Ankle Dorsiflexion  9      Strength   Overall Strength Comments  WFL's bilat LE's       Special Tests    Special Tests  Ankle/Foot Special Tests    Ankle/Foot Special Tests   Anterior Drawer Test      Anterior Drawer Test   Findings  Positive    Side   Left                Objective measurements completed on examination: See above  findings.      [place] Adult PT Treatment/Exercise - 10/18/18 0001      Exercises   Exercises  Ankle      Modalities   Modalities  Iontophoresis      Iontophoresis   Type of Iontophoresis  Dexamethasone    Location  Lt heel    Dose  1 cc    Time  6 hour patch      Manual Therapy   Soft tissue mobilization  Lt gastroc/soleus      Ankle Exercises: Stretches   Soleus Stretch  1 rep;30 seconds   Lt     Ankle Exercises: Seated   Other Seated Ankle Exercises  instructed in seated arch lifts       Trigger Point Dry Needling - 10/18/18 1246    Consent Given?  Yes    Muscles Treated Lower Body  Soleus;Gastrocnemius    Gastrocnemius Response  Twitch response elicited;Palpable increased muscle length    Soleus Response  Twitch response elicited;Palpable increased muscle length           PT Education - 10/18/18 1251    Education Details  HEP    Person(s) Educated  Patient    Methods  Explanation;Demonstration;Handout    Comprehension  Verbalized understanding;Returned demonstration;Need further instruction          PT Long Term Goals - 10/18/18 1251      PT LONG TERM GOAL #1   Title  Improve mobility and ROM through lumbar spine and bilat hips to WFL's and pain free 10/17/2018    Time  6    Period  Weeks    Status  Achieved      PT LONG TERM GOAL #2   Title  Decrease pain with patient to report returning to normal functional activities and walking program 10/17/2018    Time  6    Period  Weeks    Status  Achieved  PT LONG TERM GOAL #3   Title  Independent in HEP 10/17/2018    Time  6    Period  Weeks    Status  Achieved      PT LONG TERM GOAL #4   Title  Improve FOTO to </= 41% limitation 10/17/2018    Time  6    Period  Weeks    Status  Achieved      PT LONG TERM GOAL #5   Title  report pain in Lt heel < 3/10 with activity    Status  New    Target Date  11/29/18      PT LONG TERM GOAL #6   Title  FOTO score improved to < 34% limited (foot) for  improved function    Status  New    Target Date  11/29/18             Plan - 10/18/18 1223    Clinical Impression Statement  Pt presents today with new referral for Lt plantar fasciitis.  Pt demonstrates mild ROM limitations, decreased strength and active trigger points affecting functional mobility.  Pt will benefit from PT to address deficits listed.    Rehab Potential  Good    PT Frequency  2x / week    PT Duration  6 weeks    PT Treatment/Interventions  Patient/family education;ADLs/Self Care Home Management;Cryotherapy;Electrical Stimulation;Iontophoresis 4mg /ml Dexamethasone;Moist Heat;Ultrasound;Dry needling;Manual techniques;Neuromuscular re-education;Functional mobility training;Therapeutic activities;Therapeutic exercise;Taping    PT Next Visit Plan  review exercises, assess response to DN/manual/ionto    PT Home Exercise Plan   Access Code: [email or web address] ; Access Code: [email or web address]     Consulted and Agree with Plan of Care  Patient       Patient will benefit from skilled therapeutic intervention in order to improve the following deficits and impairments:  Postural dysfunction, Improper body mechanics, Pain, Increased fascial restricitons, Increased muscle spasms, Hypomobility, Decreased mobility, Decreased range of motion, Decreased activity tolerance, Difficulty walking, Impaired flexibility, Decreased strength  Visit Diagnosis: Pain in left foot - Plan: PT plan of care cert/re-cert  Other abnormalities of gait and mobility - Plan: PT plan of care cert/re-cert  Cramp and spasm - Plan: PT plan of care cert/re-cert     Problem List Patient Active Problem List   Diagnosis Date Noted  . Trigger thumb, left thumb 09/26/2018  . Skin lesion of wrist 09/26/2018  . Plantar fasciitis, left 09/03/2018  . Metatarsalgia of both feet 09/03/2018  . Lumbar spinal stenosis 09/03/2018  . Presbycusis of both ears 07/16/2018  . Need for influenza vaccination 07/16/2018  . Screening for  cardiovascular condition 06/06/2018  . FHx: heart disease 06/06/2018  . Vitamin D deficiency 06/06/2018  . Abnormal glucose 06/06/2018  . Elevated BP without diagnosis of hypertension 06/06/2018  . Gastroesophageal reflux disease 04/04/2017  . Hx of diverticulitis of colon 04/04/2017  . Hepatic hemangioma, Rt lobe (7 cm) Jan 2017 12/12/2016  . Recurrent UTI 12/12/2016  . Hyperlipidemia, mixed 10/23/2016  . Hypothyroidism 10/23/2016  . Depression 10/23/2016  . Diverticulosis of colon without hemorrhage 10/23/2016      Laureen Abrahams, PT, DPT 10/18/18 12:58 PM    [facility] [place], [name], [address] Phone: [phone]   Fax:  [phone]  Name: Celisa Schoenberg MRN: [id number] Date of Birth: 1951-05-01

## 2018-10-18 NOTE — Patient Instructions (Addendum)
Access Code: [email or web address]  URL: [email or web address]  Date: 10/18/2018  Prepared by: Faustino Congress   Exercises  Soleus Stretch on Wall - 3 reps - 1 sets - 30 sec hold - 2x daily - 7x weekly  Seated Arch Lifts - 10 reps - 3 sets - 1x daily - 7x weekly  IONTOPHORESIS PATIENT PRECAUTIONS & CONTRAINDICATIONS:  . Redness under one or both electrodes can occur.  This characterized by a uniform redness that usually disappears within 12 hours of treatment. . Small pinhead size blisters may result in response to the drug.  Contact your physician if the problem persists more than 24 hours. . On rare occasions, iontophoresis therapy can result in temporary skin reactions such as rash, inflammation, irritation or burns.  The skin reactions may be the result of individual sensitivity to the ionic solution used, the condition of the skin at the start of treatment, reaction to the materials in the electrodes, allergies or sensitivity to dexamethasone, or a poor connection between the patch and your skin.  Discontinue using iontophoresis if you have any of these reactions and report to your therapist. . Remove the Patch or electrodes if you have any undue sensation of pain or burning during the treatment and report discomfort to your therapist. . Tell your Therapist if you have had known adverse reactions to the application of electrical current. . If using the Patch, the LED light will turn off when treatment is complete and the patch can be removed.  Approximate treatment time is 1-3 hours.  Remove the patch when light goes off or after 6 hours. . The Patch can be worn during normal activity, however excessive motion where the electrodes have been placed can cause poor contact between the skin and the electrode or uneven electrical current resulting in greater risk of skin irritation. Marland Kitchen Keep out of the reach of children.   . DO NOT use if you have a cardiac pacemaker or any other  electrically sensitive implanted device. . DO NOT use if you have a known sensitivity to dexamethasone. . DO NOT use during Magnetic Resonance Imaging (MRI). . DO NOT use over broken or compromised skin (e.g. sunburn, cuts, or acne) due to the increased risk of skin reaction. . DO NOT SHAVE over the area to be treated:  To establish good contact between the Patch and the skin, excessive hair may be clipped. . DO NOT place the Patch or electrodes on or over your eyes, directly over your heart, or brain. . DO NOT reuse the Patch or electrodes as this may cause burns to occur.

## 2018-10-23 ENCOUNTER — Ambulatory Visit (INDEPENDENT_AMBULATORY_CARE_PROVIDER_SITE_OTHER): Payer: Medicare Other | Admitting: Physical Therapy

## 2018-10-23 DIAGNOSIS — R252 Cramp and spasm: Secondary | ICD-10-CM | POA: Diagnosis not present

## 2018-10-23 DIAGNOSIS — M545 Low back pain, unspecified: Secondary | ICD-10-CM

## 2018-10-23 DIAGNOSIS — R2689 Other abnormalities of gait and mobility: Secondary | ICD-10-CM | POA: Diagnosis not present

## 2018-10-23 DIAGNOSIS — M79672 Pain in left foot: Secondary | ICD-10-CM

## 2018-10-23 NOTE — Therapy (Addendum)
[place], [name], [address] Phone: [phone]   Fax:  [phone]  Physical Therapy Treatment/Discharge Summary  Patient Details  Name: Isabel Tyler MRN: [id number] Date of Birth: [birth year] Referring Provider (PT): Dr Dianah Field    Encounter Date: 10/23/2018  PT End of Session - 10/23/18 0935    Visit Number  10    Number of Visits  22    Date for PT Re-Evaluation  12/03/18    PT Start Time  0930    PT Stop Time  1015    PT Time Calculation (min)  45 min    Activity Tolerance  Patient tolerated treatment well    Behavior During Therapy  [facility] for tasks assessed/performed       Past Medical History:  Diagnosis Date  . Anxiety   . Diverticular disease   . Hyperlipidemia, mixed   . Hypothyroidism 2000  . Vitamin D deficiency     Past Surgical History:  Procedure Laterality Date  . BREAST BIOPSY Left    benign  . CHOLECYSTECTOMY N/A 02/08/2017   Procedure: LAPAROSCOPIC CHOLECYSTECTOMY WITH INTRAOPERATIVE CHOLANGIOGRAM;  Surgeon: Donnie Mesa, MD;  Location: [facility];  Service: General;  Laterality: N/A;  . EYE SURGERY     Eyelid lifted  . TONSILLECTOMY      There were no vitals filed for this visit.  Subjective Assessment - 10/23/18 0931    Subjective  Pt tried to walk (36mle) without her foot taped and her pain shot up to 9/10.  Pt continues to have some achy feeling in her back, which is worsened with lifting grandchild (32#)    Currently in Pain?  Yes    Pain Score  3     Pain Location  Back    Pain Orientation  Left;Right;Lower    Pain Descriptors / Indicators  Aching    Aggravating Factors   lifting     Pain Relieving Factors  heat, stretching     Multiple Pain Sites  Yes    Pain Score  4    Pain Location  Heel    Pain Orientation  Left    Pain Descriptors / Indicators  Aching;Sharp    Aggravating Factors   walking >1 mile    Pain Relieving Factors  rolling it on ice;  tape.          [facility] 10[facility] 0001      Assessment   Medical Diagnosis  Lt Plantar Fasciitis    Referring Provider (PT)  Dr TDianah Field    Onset Date/Surgical Date  08/11/18    Next MD Visit  11/12/2018    Prior Therapy  none      AROM   Left Ankle Dorsiflexion  8    Lumbar Flexion  WNL    Lumbar Extension  WNL    Lumbar - Right Side Bend  WNL   tightness in LBP   Lumbar - Left Side Bend  WNL    Lumbar - Right Rotation  80%    Lumbar - Left Rotation  75%   some discomfort       [facility] Adult PT Treatment/Exercise - 10/23/18 0001      Lumbar Exercises: Stretches   Passive Hamstring Stretch  Right;Left;2 reps;30 seconds   supine with strap   Piriformis Stretch  Right;Left;2 reps;30 seconds   seated   Gastroc Stretch  Left;Right;30 seconds;4 reps   runners stretch, then heel off step  Lumbar Exercises: Aerobic   Nustep  L4: legs x 6.5 min (decreased from L5, and stopped arms after 1 min due to increased back discomfort)      Lumbar Exercises: Standing   Lifting  5 reps;From 12";Weights    Lifting Weights (lbs)  11   black mat to chest; to simulate picking up child   Lifting Limitations  VC to engage core      Lumbar Exercises: Supine   Ab Set  5 reps      Modalities   Modalities  Iontophoresis;Ultrasound      Ultrasound   Ultrasound Location  Lt plantar fascia (focus at calcaneus)    Ultrasound Parameters  100%, 1.2 w/cm2, 8 min     Ultrasound Goals  Pain      Iontophoresis   Type of Iontophoresis  Dexamethasone    Location  Lt heel    Dose  1 cc    Time  120 mA patch, 12 hrs.       Manual Therapy   Soft tissue mobilization  STM to Lt plantar fascia to decrease tightness and pain.       [employer]   Inhibit Muscle   --   tape held due to ionto patch application      Ankle Exercises: Seated   Ankle Circles/Pumps  10 reps    Heel Raises  Both;10 reps    Toe Raise  10 reps    Other Seated Ankle Exercises  reviewed seated arch lifts  x 5 reps                   PT Long Term Goals - 10/23/18 1154      PT LONG TERM GOAL #1   Title  Improve mobility and ROM through lumbar spine and bilat hips to WFL's and pain free 12/03/2018    Time  12    Period  Weeks    Status  Revised      PT LONG TERM GOAL #2   Title  Decrease pain with patient to report returning to normal functional activities and walking program 10/17/2018    Time  6    Period  Weeks    Status  Achieved      PT LONG TERM GOAL #3   Title  Independent in HEP 12/03/2018    Time  12    Period  Weeks    Status  Revised      PT LONG TERM GOAL #4   Title  Improve FOTO to </= 41% limitation 10/17/2018    Time  6    Period  Weeks    Status  Achieved      PT LONG TERM GOAL #5   Title  report pain in Lt heel < 3/10 with activity 12/03/2018    Time  6    Period  Weeks    Status  Revised      PT LONG TERM GOAL #6   Title  FOTO score improved to < 34% limited (foot) for improved function 12/03/2018    Time  6    Period  Weeks    Status  Revised            Plan - 10/23/18 1200    Clinical Impression Statement  Pt had difficulty tolerating runners stretch due to increased back pain.  She tolerated heels off step stretch with greater ease. Pt reported decreased pain by 1-2 points at end of session.  Pt has  partially met her goals and will benefit from continued PT intervention to maximize functional mobility with less pain.      Rehab Potential  Good    PT Frequency  2x / week    PT Duration  6 weeks    PT Treatment/Interventions  Patient/family education;ADLs/Self Care Home Management;Cryotherapy;Electrical Stimulation;Iontophoresis 77m/ml Dexamethasone;Moist Heat;Ultrasound;Dry needling;Manual techniques;Neuromuscular re-education;Functional mobility training;Therapeutic activities;Therapeutic exercise;Taping    PT Next Visit Plan  add core exercises to HEP; assess response to ionto;  manual therapy to LLE.     PT Home Exercise Plan   Access Code:  [email or web address]; Access Code: [email or web address]     Consulted and Agree with Plan of Care  Patient       Patient will benefit from skilled therapeutic intervention in order to improve the following deficits and impairments:  Postural dysfunction, Improper body mechanics, Pain, Increased fascial restricitons, Increased muscle spasms, Hypomobility, Decreased mobility, Decreased range of motion, Decreased activity tolerance, Difficulty walking, Impaired flexibility, Decreased strength  Visit Diagnosis: Pain in left foot  Other abnormalities of gait and mobility  Cramp and spasm  Acute left-sided low back pain without sciatica     Problem List Patient Active Problem List   Diagnosis Date Noted  . Trigger thumb, left thumb 09/26/2018  . Skin lesion of wrist 09/26/2018  . Plantar fasciitis, left 09/03/2018  . Metatarsalgia of both feet 09/03/2018  . Lumbar spinal stenosis 09/03/2018  . Presbycusis of both ears 07/16/2018  . Need for influenza vaccination 07/16/2018  . Screening for cardiovascular condition 06/06/2018  . FHx: heart disease 06/06/2018  . Vitamin D deficiency 06/06/2018  . Abnormal glucose 06/06/2018  . Elevated BP without diagnosis of hypertension 06/06/2018  . Gastroesophageal reflux disease 04/04/2017  . Hx of diverticulitis of colon 04/04/2017  . Hepatic hemangioma, Rt lobe (7 cm) Jan 2017 12/12/2016  . Recurrent UTI 12/12/2016  . Hyperlipidemia, mixed 10/23/2016  . Hypothyroidism 10/23/2016  . Depression 10/23/2016  . Diverticulosis of colon without hemorrhage 10/23/2016   JKerin Perna PTA 10/23/18 12:29 PM  Celyn P. HHelene KelpPT, MPH 10/23/18 12:31 PM   [facility] [place] [place] [address]Phone: [phone]  Fax:  [phone] Name: Isabel OlssonMRN: [id number] of Birth: 129-Nov-1952    PHYSICAL THERAPY DISCHARGE SUMMARY  Visits from Start of Care: 10  Current  functional level related to goals / functional outcomes: See above   Remaining deficits: See above   Education / Equipment: HEP  Plan: Patient agrees to discharge.  Patient goals were partially met. Patient is being discharged due to being pleased with the current functional level.  ?????    SLaureen Abrahams PT, DPT 11/14/18 12:56 PM  [facility] [place] [phone](office) [phone](fax)

## 2018-10-28 ENCOUNTER — Other Ambulatory Visit: Payer: Self-pay | Admitting: Physician Assistant

## 2018-10-28 DIAGNOSIS — M810 Age-related osteoporosis without current pathological fracture: Secondary | ICD-10-CM

## 2018-10-29 ENCOUNTER — Encounter: Payer: Medicare Other | Admitting: Physical Therapy

## 2018-11-10 ENCOUNTER — Other Ambulatory Visit: Payer: Self-pay | Admitting: Adult Health

## 2018-11-12 ENCOUNTER — Encounter: Payer: Medicare Other | Admitting: Physical Therapy

## 2018-11-12 ENCOUNTER — Ambulatory Visit: Payer: Medicare Other | Admitting: Sports Medicine

## 2018-12-11 ENCOUNTER — Encounter: Payer: Self-pay | Admitting: Adult Health

## 2018-12-11 NOTE — Progress Notes (Signed)
FOLLOW UP  Assessment and Plan:   Hypothyroidism continue medications the same pending lab results reminded to take on an empty stomach 30-51mins before food.  check TSH level  Cholesterol Currently above goal; newly on Crestor, titrate Continue low cholesterol diet and exercise.  Check lipid panel.   Hypothyroidism continue medications the same pending lab results reminded to take on an empty stomach 30-42mins before food.  check TSH level  BMI 21 Continue to recommend diet heavy in fruits and veggies and low in animal meats, cheeses, and dairy products, appropriate calorie intake Discuss exercise recommendations routinely Continue to monitor weight at each visit  Vitamin D Def At goal at last visit; continue supplementation to maintain goal of 70-100 Defer Vit D level  Depression/anxiety Continue medications  Lifestyle discussed: diet/exerise, sleep hygiene, stress management, hydration  Insomnia  - improved with lifestyle modification and magnesium/melatonin PRN  Rosacea Lifestyle discussed, information provided on AVS Brimonidine tartrate gel given to try daily; follow up if not improving and will try topical abx  Plantar fascitis Continue inserts, stretches/icing, meloxicam Follow up ortho for injections, or if can't get in can reschedule to have done at our office  Left sciatica Neg straight leg raise; stretches given for sciatica rehab; resume meloxicam  Continue diet and meds as discussed. Further disposition pending results of labs. Discussed med's effects and SE's.   Over 30 minutes of exam, counseling, chart review, and critical decision making was performed.   Future Appointments  Date Time Provider [place]  07/02/2019  2:00 PM Unk Pinto, MD [facility] None  07/22/2019  2:00 PM [name], NP [facility] None     ----------------------------------------------------------------------------------------------------------------------  HPI 68 y.o. female  presents for 3 month follow up on cholesterol, hypothyroid, depression and vitamin D deficiency.  She is following with Dr. Aundria Mems, sports med for L plantar fascitis. Needs to reschedule injection after failing conservative interventions including 8 weeks of PT.  She has not been taking meloxicam. She additionally reports new L sciatica pain with radiation.    She is concerned about rosacea flare with intermittent redness and new pustules. Has not been prescribed any meds for this in the past.   She reports some fatigue and insomnia, recently improved with magnesium and melatonin.   she has a diagnosis of anxiety and is currently on lexapro 20 mg daily, reports symptoms are well controlled on current regimen. She has tried to taper off several times but hasn't done well.   BMI is Body mass index is 21.58 kg/m., she has been working on diet and exercise. Wt Readings from Last 3 Encounters:  12/12/18 114 lb 3.2 oz (51.8 kg)  09/03/18 116 lb (52.6 kg)  07/16/18 114 lb (51.7 kg)   She does not check BP at home, today their BP is BP: (!) 96/58  She does workout. She denies chest pain, shortness of breath, dizziness.   She is on cholesterol medication (rosuvastatin 20 mg three days weekly) and denies myalgias. Her cholesterol is at goal. The cholesterol last visit was:   Lab Results  Component Value Date   CHOL 184 06/06/2018   HDL 77 06/06/2018   LDLCALC 93 06/06/2018   TRIG 59 06/06/2018   CHOLHDL 2.4 06/06/2018    She has been working on diet and exercise for glucose management, and denies foot ulcerations, increased appetite, nausea, paresthesia of the feet, polydipsia, polyuria, visual disturbances, vomiting and weight loss. Last A1C in the office was:  Lab Results  Component Value  Date   HGBA1C 5.4 06/06/2018   She is on  thyroid medication. Her medication was not changed last visit.   Lab Results  Component Value Date   TSH 0.82 07/16/2018   Patient is on Vitamin D supplement and at goal:   Lab Results  Component Value Date   VD25OH 77 06/06/2018        Current Medications:  Current Outpatient Medications on File Prior to Visit  Medication Sig  . alendronate (FOSAMAX) 70 MG tablet TAKE 1 TABLET BY MOUTH EVERY WEEK IN THE MORNING ON AN EMPTY STOMACH WITH ONLY A GLASS OF WATER; NOTHING BY MOUTH FOR 1 HOUR  . Ascorbic Acid (VITAMIN C) 1000 MG tablet Take 1,000 mg by mouth daily.  Marland Kitchen aspirin EC 81 MG tablet Take 81 mg by mouth daily.  . Calcium Citrate (CITRACAL PO) Take 1 tablet by mouth daily.  . Cholecalciferol (VITAMIN D-3) 5000 units TABS Take 1 tablet by mouth daily.  Marland Kitchen escitalopram (LEXAPRO) 20 MG tablet Take 1 tablet Daily for Mood  . Flaxseed, Linseed, (FLAXSEED OIL) 1200 MG CAPS Take 1 capsule by mouth daily.  . fluticasone (FLONASE) 50 MCG/ACT nasal spray Place 1 spray into both nostrils daily as needed for allergies or rhinitis.  Marland Kitchen levothyroxine (SYNTHROID, LEVOTHROID) 75 MCG tablet Take 1 tablet daily with only water on an empty stomach for 30 minutes and no antacids for 4 hours  . meloxicam (MOBIC) 15 MG tablet One tab PO qAM with breakfast for 2 weeks, then daily prn pain.  . Omega-3 Fatty Acids (FISH OIL) 1200 MG CAPS Take 1 capsule by mouth daily.  Marland Kitchen OVER THE COUNTER MEDICATION Patient takes "[place]" organic apple cider vinegar with added herbs and spices. 1-2 TBSP daily.  . Probiotic Product (PROBIOTIC-10) CAPS Take 1 capsule by mouth daily.  . rosuvastatin (CRESTOR) 40 MG tablet TAKE 1/2 TO 1 TABLET BY MOUTH DAILY OR AS DIRECTED FOR CHOLESTEROL   No current facility-administered medications on file prior to visit.      Allergies:  Allergies  Allergen Reactions  . Ciprofloxacin Other (See Comments)    Alleged tendonitis     Medical History:  Past Medical History:   Diagnosis Date  . Anxiety   . Diverticular disease   . Hx of diverticulitis of colon 04/04/2017  . Hyperlipidemia, mixed   . Hypothyroidism 2000  . Vitamin D deficiency    Family history- Reviewed and unchanged Social history- Reviewed and unchanged   Review of Systems:  Review of Systems  Constitutional: Negative for malaise/fatigue and weight loss.  HENT: Negative for hearing loss and tinnitus.   Eyes: Negative for blurred vision and double vision.  Respiratory: Negative for cough, shortness of breath and wheezing.   Cardiovascular: Negative for chest pain, palpitations, orthopnea, claudication and leg swelling.  Gastrointestinal: Negative for abdominal pain, blood in stool, constipation, diarrhea, heartburn, melena, nausea and vomiting.  Genitourinary: Negative.   Musculoskeletal: Negative for back pain, joint pain and myalgias.       L hip/shooting down leg, L heel pain  Skin: Negative for rash.  Neurological: Negative for dizziness, tingling, sensory change, weakness and headaches.  Endo/Heme/Allergies: Negative for polydipsia.  Psychiatric/Behavioral: Negative for depression, hallucinations and substance abuse. The patient has insomnia. The patient is not nervous/anxious.   All other systems reviewed and are negative.   Physical Exam: BP (!) 96/58   Pulse 74   Temp (!) 97.5 F (36.4 C)   Wt 114 lb 3.2  oz (51.8 kg)   SpO2 98%   BMI 21.58 kg/m  Wt Readings from Last 3 Encounters:  12/12/18 114 lb 3.2 oz (51.8 kg)  09/03/18 116 lb (52.6 kg)  07/16/18 114 lb (51.7 kg)   General Appearance: Well nourished, in no apparent distress. Eyes: PERRLA, EOMs, conjunctiva no swelling or erythema Sinuses: No Frontal/maxillary tenderness ENT/Mouth: Ext aud canals clear, TMs without erythema, bulging. No erythema, swelling, or exudate on post pharynx.  Tonsils not swollen or erythematous. Hearing normal.  Neck: Supple, thyroid normal.  Respiratory: Respiratory effort normal, BS  equal bilaterally without rales, rhonchi, wheezing or stridor.  Cardio: RRR with no MRGs. Brisk peripheral pulses without edema.  Abdomen: Soft, + BS.  Non tender, no guarding, rebound, hernias, masses. Lymphatics: Non tender without lymphadenopathy.  Musculoskeletal: Full ROM, 5/5 strength, antalgic gait, tenderness to L anterio/medial heel, neg straight leg raise, no SI joint tenderness Skin: Warm, dry; scattered erythematous areas to bilateral cheeks with 2-3 pustules Neuro: Cranial nerves intact. No cerebellar symptoms. Distal sensation intact bilateral lower extremities.  Psych: Awake and oriented X 3, normal affect, Insight and Judgment appropriate.    Izora Ribas, NP 9:39 AM [facility]

## 2018-12-12 ENCOUNTER — Other Ambulatory Visit: Payer: Self-pay

## 2018-12-12 ENCOUNTER — Ambulatory Visit (INDEPENDENT_AMBULATORY_CARE_PROVIDER_SITE_OTHER): Payer: Medicare Other | Admitting: Adult Health

## 2018-12-12 ENCOUNTER — Encounter: Payer: Self-pay | Admitting: Adult Health

## 2018-12-12 VITALS — BP 96/58 | HR 74 | Temp 97.5°F | Wt 114.2 lb

## 2018-12-12 DIAGNOSIS — R030 Elevated blood-pressure reading, without diagnosis of hypertension: Secondary | ICD-10-CM | POA: Diagnosis not present

## 2018-12-12 DIAGNOSIS — R7309 Other abnormal glucose: Secondary | ICD-10-CM

## 2018-12-12 DIAGNOSIS — E559 Vitamin D deficiency, unspecified: Secondary | ICD-10-CM

## 2018-12-12 DIAGNOSIS — E782 Mixed hyperlipidemia: Secondary | ICD-10-CM | POA: Diagnosis not present

## 2018-12-12 DIAGNOSIS — F32A Depression, unspecified: Secondary | ICD-10-CM

## 2018-12-12 DIAGNOSIS — E039 Hypothyroidism, unspecified: Secondary | ICD-10-CM

## 2018-12-12 DIAGNOSIS — Z79899 Other long term (current) drug therapy: Secondary | ICD-10-CM

## 2018-12-12 DIAGNOSIS — L719 Rosacea, unspecified: Secondary | ICD-10-CM

## 2018-12-12 DIAGNOSIS — F329 Major depressive disorder, single episode, unspecified: Secondary | ICD-10-CM | POA: Diagnosis not present

## 2018-12-12 DIAGNOSIS — Z6821 Body mass index (BMI) 21.0-21.9, adult: Secondary | ICD-10-CM | POA: Diagnosis not present

## 2018-12-12 MED ORDER — BRIMONIDINE TARTRATE 0.33 % EX GEL: 1.0000 "application " | Freq: Every day | CUTANEOUS | 1 refills | Status: DC | PRN

## 2018-12-12 NOTE — Patient Instructions (Addendum)
Topical azelaic acid can also be helpful for rosacea  Can try topical antibiotics or oral antibiotic if this doesn't seem to improve things  Try picking up OTC hydrocortisone cream for your lower back rash  Call to schedule injection for plantar fascitis if needed   Rosacea Rosacea is a long-term (chronic) condition that affects the skin of the face, including the cheeks, nose, forehead, and chin. This condition can also affect the eyes. Rosacea causes blood vessels near the surface of the skin to enlarge, which results in redness. What are the causes? The cause of this condition is not known. Certain triggers can make rosacea worse, including:  Hot baths.  Exercise.  Sunlight.  Very hot or cold temperatures.  Hot or spicy foods and drinks.  Drinking alcohol.  Stress.  Taking blood pressure medicine.  Long-term use of topical steroids on the face. What increases the risk? You are more likely to develop this condition if you:  Are older than 66 years of age.  Are a woman.  Have light-colored skin (light complexion).  Have a family history of rosacea. What are the signs or symptoms? Symptoms of this condition include:  Redness of the face.  Red bumps or pimples on the face.  A red, enlarged nose.  Blushing easily.  Red lines on the skin.  Irritated, burning, or itchy feeling in the eyes.  Swollen eyelids.  Drainage from the eyes.  Feeling like there is something in your eye. How is this diagnosed? This condition is diagnosed with a medical history and physical exam. How is this treated? There is no cure for this condition, but treatment can help to control your symptoms. Your health care provider may recommend that you see a skin specialist (dermatologist). Treatment may include:  Medicines that are applied to the skin or taken by mouth (orally). This can include antibiotic medicines.  Laser treatment to improve the appearance of the  skin.  Surgery. This is rare. Your health care provider will also recommend the best way to take care of your skin. Even after your skin improves, you will likely need to continue treatment to prevent your rosacea from coming back. Follow these instructions at home: Skin care Take care of your skin as told by your health care provider. You may be told to do these things:  Wash your skin gently two or more times each day.  Use mild soap.  Use a sunscreen or sunblock with SPF 30 or greater.  Use gentle cosmetics that are meant for sensitive skin.  Shave with an electric shaver instead of a blade. Lifestyle  Try to keep track of what foods trigger this condition. Avoid any triggers. These may include: ? Spicy foods. ? Seafood. ? Cheese. ? Hot liquids. ? Nuts. ? Chocolate. ? Iodized salt.  Do not drink alcohol.  Avoid extremely cold or hot temperatures.  Try to reduce your stress. If you need help, talk with your health care provider.  When you exercise, do these things to stay cool: ? Limit sun exposure to your face. ? Use a fan. ? Do shorter and more frequent intervals of exercise. General instructions  Take and apply over-the-counter and prescription medicines only as told by your health care provider.  If you were prescribed an antibiotic medicine, apply it or take it as told by your health care provider. Do not stop using the antibiotic even if your condition improves.  If your eyelids are affected, apply warm compresses to them. Do this  as told by your health care provider.  Keep all follow-up visits as told by your health care provider. This is important. Contact a health care provider if:  Your symptoms get worse.  Your symptoms do not improve after 2 months of treatment.  You have new symptoms.  You have any changes in vision or you have problems with your eyes, such as redness or itching.  You feel depressed.  You lose your appetite.  You have  trouble concentrating. Summary  Rosacea is a long-term (chronic) condition that affects the skin of the face, including the cheeks, nose, forehead, and chin.  Take care of your skin as told by your health care provider.  Take and apply over-the-counter and prescription medicines only as told by your health care provider.  Contact a health care provider if your symptoms get worse or if you have any changes in vision or other problems with your eyes, such as redness or itching.  Keep all follow-up visits as told by your health care provider. This is important. This information is not intended to replace advice given to you by your health care provider. Make sure you discuss any questions you have with your health care provider. Document Released: 09/21/2004 Document Revised: 01/16/2018 Document Reviewed: 01/16/2018 Elsevier Interactive Patient Education  2019 [employer]   Brimonidine topical gel What is this medicine? BRIMONIDINE (bri MOE ni deen) is used on the face to reduce redness caused by rosacea. This medicine may be used for other purposes; ask your health care provider or pharmacist if you have questions. COMMON BRAND NAME(S): Mirvaso What should I tell my health care provider before I take this medicine? They need to know if you have any of these conditions: -Buerger's disease -depression -heart disease -irritated skin or open sores -Raynaud's phenomenon -scleroderma -Sjogren's syndrome -an unusual or allergic reaction to brimonidine, other medicines, foods, dyes, or preservatives -pregnant or trying to get pregnant -breast-feeding How should I use this medicine? This medicine is for external use only. Use your medicine at regular intervals. Do not use it more often than directed. Do not stop using except on the advice of your doctor or health care professional. Talk to your pediatrician regarding the use of this medicine in children. Special care may be  needed. Overdosage: If you think you have taken too much of this medicine contact a poison control center or emergency room at once. NOTE: This medicine is only for you. Do not share this medicine with others. What if I miss a dose? If you miss a dose, use it as soon as you can. If it is almost time for your next dose, use only that dose. Do not use double or extra doses. What may interact with this medicine? -alcohol -certain medicines for blood pressure, heart disease, irregular heart beat -medicines that cause drowsiness or tiredness -MAOIs like Carbex, Eldepryl, Marplan, Nardil, and Parnate This list may not describe all possible interactions. Give your health care provider a list of all the medicines, herbs, non-prescription drugs, or dietary supplements you use. Also tell them if you smoke, drink alcohol, or use illegal drugs. Some items may interact with your medicine. What should I watch for while using this medicine? Do not get this medicine in your eyes. If you do, rinse out with plenty of cool tap water. If anyone, especially a child, accidentally swallows this medicine, they may have serious side effects and need to be treated in a hospital. Get medical help right away if  anyone swallows this medicine and has lack of energy, trouble breathing or stops breathing, a slow heart beat, confusion, sweating, restlessness, muscle spasms, or twitching. What side effects may I notice from receiving this medicine? Side effects that you should report to your doctor or health care professional as soon as possible: -allergic reactions like skin rash, itching or hives, swelling of the face, lips, or tongue Side effects that usually do not require medical attention (report to your doctor or health care professional if they continue or are bothersome): -burning sensation of the skin -facial flushing -skin redness or irritation This list may not describe all possible side effects. Call your doctor for  medical advice about side effects. You may report side effects to FDA at 1-800-FDA-1088. Where should I keep my medicine? Keep out of the reach of children. Store between 20 and 25 degrees C (68 and 77 degrees F). Throw away any unused medicine after the expiration date. NOTE: This sheet is a summary. It may not cover all possible information. If you have questions about this medicine, talk to your doctor, pharmacist, or health care provider.  2019 Elsevier/Gold Standard (2015-09-16 11:34:00)       Sciatica Rehab Ask your health care provider which exercises are safe for you. Do exercises exactly as told by your health care provider and adjust them as directed. It is normal to feel mild stretching, pulling, tightness, or discomfort as you do these exercises, but you should stop right away if you feel sudden pain or your pain gets worse.Do not begin these exercises until told by your health care provider. Stretching and range of motion exercises These exercises warm up your muscles and joints and improve the movement and flexibility of your hips and your back. These exercises also help to relieve pain, numbness, and tingling. Exercise A: Sciatic nerve glide 1. Sit in a chair with your head facing down toward your chest. Place your hands behind your back. Let your shoulders slump forward. 2. Slowly straighten one of your knees while you tilt your head back as if you are looking toward the ceiling. Only straighten your leg as far as you can without making your symptoms worse. 3. Hold for __________ seconds. 4. Slowly return to the starting position. 5. Repeat with your other leg. Repeat __________ times. Complete this exercise __________ times a day. Exercise B: Knee to chest with hip adduction and internal rotation  1. Lie on your back on a firm surface with both legs straight. 2. Bend one of your knees and move it up toward your chest until you feel a gentle stretch in your lower back and  buttock. Then, move your knee toward the shoulder that is on the opposite side from your leg. ? Hold your leg in this position by holding onto the front of your knee. 3. Hold for __________ seconds. 4. Slowly return to the starting position. 5. Repeat with your other leg. Repeat __________ times. Complete this exercise __________ times a day. Exercise C: Prone extension on elbows  1. Lie on your abdomen on a firm surface. A bed may be too soft for this exercise. 2. Prop yourself up on your elbows. 3. Use your arms to help lift your chest up until you feel a gentle stretch in your abdomen and your lower back. ? This will place some of your body weight on your elbows. If this is uncomfortable, try stacking pillows under your chest. ? Your hips should stay down, against the surface that  you are lying on. Keep your hip and back muscles relaxed. 4. Hold for __________ seconds. 5. Slowly relax your upper body and return to the starting position. Repeat __________ times. Complete this exercise __________ times a day. Strengthening exercises These exercises build strength and endurance in your back. Endurance is the ability to use your muscles for a long time, even after they get tired. Exercise D: Pelvic tilt 1. Lie on your back on a firm surface. Bend your knees and keep your feet flat. 2. Tense your abdominal muscles. Tip your pelvis up toward the ceiling and flatten your lower back into the floor. ? To help with this exercise, you may place a small towel under your lower back and try to push your back into the towel. 3. Hold for __________ seconds. 4. Let your muscles relax completely before you repeat this exercise. Repeat __________ times. Complete this exercise __________ times a day. Exercise E: Alternating arm and leg raises  1. Get on your hands and knees on a firm surface. If you are on a hard floor, you may want to use padding to cushion your knees, such as an exercise mat. 2. Line up  your arms and legs. Your hands should be below your shoulders, and your knees should be below your hips. 3. Lift your left leg behind you. At the same time, raise your right arm and straighten it in front of you. ? Do not lift your leg higher than your hip. ? Do not lift your arm higher than your shoulder. ? Keep your abdominal and back muscles tight. ? Keep your hips facing the ground. ? Do not arch your back. ? Keep your balance carefully, and do not hold your breath. 4. Hold for __________ seconds. 5. Slowly return to the starting position and repeat with your right leg and your left arm. Repeat __________ times. Complete this exercise __________ times a day. Posture and body mechanics  Body mechanics refers to the movements and positions of your body while you do your daily activities. Posture is part of body mechanics. Good posture and healthy body mechanics can help to relieve stress in your body's tissues and joints. Good posture means that your spine is in its natural S-curve position (your spine is neutral), your shoulders are pulled back slightly, and your head is not tipped forward. The following are general guidelines for applying improved posture and body mechanics to your everyday activities. Standing   When standing, keep your spine neutral and your feet about hip-width apart. Keep a slight bend in your knees. Your ears, shoulders, and hips should line up.  When you do a task in which you stand in one place for a long time, place one foot up on a stable object that is 2-4 inches (5-10 cm) high, such as a footstool. This helps keep your spine neutral. Sitting   When sitting, keep your spine neutral and keep your feet flat on the floor. Use a footrest, if necessary, and keep your thighs parallel to the floor. Avoid rounding your shoulders, and avoid tilting your head forward.  When working at a desk or a computer, keep your desk at a height where your hands are slightly lower  than your elbows. Slide your chair under your desk so you are close enough to maintain good posture.  When working at a computer, place your monitor at a height where you are looking straight ahead and you do not have to tilt your head forward or downward  to look at the screen. Resting   When lying down and resting, avoid positions that are most painful for you.  If you have pain with activities such as sitting, bending, stooping, or squatting (flexion-based activities), lie in a position in which your body does not bend very much. For example, avoid curling up on your side with your arms and knees near your chest (fetal position).  If you have pain with activities such as standing for a long time or reaching with your arms (extension-based activities), lie with your spine in a neutral position and bend your knees slightly. Try the following positions: ? Lying on your side with a pillow between your knees. ? Lying on your back with a pillow under your knees. Lifting   When lifting objects, keep your feet at least shoulder-width apart and tighten your abdominal muscles.  Bend your knees and hips and keep your spine neutral. It is important to lift using the strength of your legs, not your back. Do not lock your knees straight out.  Always ask for help to lift heavy or awkward objects. This information is not intended to replace advice given to you by your health care provider. Make sure you discuss any questions you have with your health care provider. Document Released: 08/14/2005 Document Revised: 04/20/2016 Document Reviewed: 04/30/2015 Elsevier Interactive Patient Education  2019 [employer].

## 2018-12-13 ENCOUNTER — Ambulatory Visit (INDEPENDENT_AMBULATORY_CARE_PROVIDER_SITE_OTHER): Payer: Medicare Other | Admitting: Sports Medicine

## 2018-12-13 ENCOUNTER — Encounter: Payer: Self-pay | Admitting: Sports Medicine

## 2018-12-13 DIAGNOSIS — M722 Plantar fascial fibromatosis: Secondary | ICD-10-CM

## 2018-12-13 LAB — CBC WITH DIFFERENTIAL/PLATELET
Absolute Monocytes: 408 cells/uL (ref 200–950)
Basophils Absolute: 48 cells/uL (ref 0–200)
Basophils Relative: 1 %
Eosinophils Absolute: 48 cells/uL (ref 15–500)
Eosinophils Relative: 1 %
HCT: 39.3 % (ref 35.0–45.0)
Hemoglobin: 13.4 g/dL (ref 11.7–15.5)
Lymphs Abs: 1565 cells/uL (ref 850–3900)
MCH: 32.8 pg (ref 27.0–33.0)
MCHC: 34.1 g/dL (ref 32.0–36.0)
MCV: 96.3 fL (ref 80.0–100.0)
MPV: 10.3 fL (ref 7.5–12.5)
Monocytes Relative: 8.5 %
Neutro Abs: 2731 cells/uL (ref 1500–7800)
Neutrophils Relative %: 56.9 %
Platelets: 256 10*3/uL (ref 140–400)
RBC: 4.08 10*6/uL (ref 3.80–5.10)
RDW: 12 % (ref 11.0–15.0)
Total Lymphocyte: 32.6 %
WBC: 4.8 10*3/uL (ref 3.8–10.8)

## 2018-12-13 LAB — COMPLETE METABOLIC PANEL WITH GFR
AG Ratio: 1.6 (calc) (ref 1.0–2.5)
ALT: 19 U/L (ref 6–29)
AST: 19 U/L (ref 10–35)
Albumin: 4.1 g/dL (ref 3.6–5.1)
Alkaline phosphatase (APISO): 44 U/L (ref 37–153)
BUN: 21 mg/dL (ref 7–25)
CO2: 33 mmol/L — ABNORMAL HIGH (ref 20–32)
Calcium: 9.7 mg/dL (ref 8.6–10.4)
Chloride: 103 mmol/L (ref 98–110)
Creat: 0.68 mg/dL (ref 0.50–0.99)
GFR, Est African American: 106 mL/min/{1.73_m2} (ref >=60)
GFR, Est Non African American: 92 mL/min/{1.73_m2} (ref >=60)
Globulin: 2.5 g/dL (calc) (ref 1.9–3.7)
Glucose, Bld: 91 mg/dL (ref 65–99)
Potassium: 5 mmol/L (ref 3.5–5.3)
Sodium: 140 mmol/L (ref 135–146)
Total Bilirubin: 0.5 mg/dL (ref 0.2–1.2)
Total Protein: 6.6 g/dL (ref 6.1–8.1)

## 2018-12-13 LAB — LIPID PANEL
Cholesterol: 174 mg/dL (ref <200)
HDL: 67 mg/dL (ref >=50)
LDL Cholesterol (Calc): 91 mg/dL (calc)
Non-HDL Cholesterol (Calc): 107 mg/dL (calc) (ref <130)
Total CHOL/HDL Ratio: 2.6 (calc) (ref <5.0)
Triglycerides: 69 mg/dL (ref <150)

## 2018-12-13 LAB — TSH: TSH: 0.68 mIU/L (ref 0.40–4.50)

## 2018-12-13 NOTE — Assessment & Plan Note (Signed)
She has several sets of orthotics, air heel brace, and has done formal PT. Persistent discomfort, injection as above, return in a month.

## 2018-12-13 NOTE — Progress Notes (Signed)
Subjective:    CC: Left foot pain  HPI: This is a pleasant 68 year old female, we have been treating her for plantar fasciitis, she has multiple orthotics, has done the exercises, wearing a brace, no improvement.  Pain is worse in the first few steps in the morning, localized on the plantar aspect of the left heel.  Severe, persistent.  I reviewed the past medical history, family history, social history, surgical history, and allergies today and no changes were needed.  Please see the problem list section below in epic for further details.  Past Medical History: Past Medical History:  Diagnosis Date  . Anxiety   . Diverticular disease   . Hx of diverticulitis of colon 04/04/2017  . Hyperlipidemia, mixed   . Hypothyroidism 2000  . Vitamin D deficiency    Past Surgical History: Past Surgical History:  Procedure Laterality Date  . BREAST BIOPSY Left    benign  . CHOLECYSTECTOMY N/A 02/08/2017   Procedure: LAPAROSCOPIC CHOLECYSTECTOMY WITH INTRAOPERATIVE CHOLANGIOGRAM;  Surgeon: Donnie Mesa, MD;  Location: [facility];  Service: General;  Laterality: N/A;  . EYE SURGERY     Eyelid lifted  . TONSILLECTOMY     Social History: Social History   Socioeconomic History  . Marital status: Married    Spouse name: Not on file  . Number of children: Not on file  . Years of education: Not on file  . Highest education level: Not on file  Occupational History  . Not on file  Social Needs  . Financial resource strain: Not on file  . Food insecurity:    Worry: Not on file    Inability: Not on file  . Transportation needs:    Medical: Not on file    Non-medical: Not on file  Tobacco Use  . Smoking status: Never Smoker  . Smokeless tobacco: Never Used  Substance and Sexual Activity  . Alcohol use: Yes    Comment: occasional   . Drug use: No  . Sexual activity: Yes  Lifestyle  . Physical activity:    Days per week: Not on file    Minutes per session: Not on file  . Stress: Not on  file  Relationships  . Social connections:    Talks on phone: Not on file    Gets together: Not on file    Attends religious service: Not on file    Active member of club or organization: Not on file    Attends meetings of clubs or organizations: Not on file    Relationship status: Not on file  Other Topics Concern  . Not on file  Social History Narrative  . Not on file   Family History: Family History  Problem Relation Age of Onset  . Heart disease Mother   . Heart disease Father   . Breast cancer Sister    Allergies: Allergies  Allergen Reactions  . Ciprofloxacin Other (See Comments)    Alleged tendonitis   Medications: See med rec.  Review of Systems: No fevers, chills, night sweats, weight loss, chest pain, or shortness of breath.   Objective:    General: Well Developed, well nourished, and in no acute distress.  Neuro: Alert and oriented x3, extra-ocular muscles intact, sensation grossly intact.  HEENT: Normocephalic, atraumatic, pupils equal round reactive to light, neck supple, no masses, no lymphadenopathy, thyroid nonpalpable.  Skin: Warm and dry, no rashes. Cardiac: Regular rate and rhythm, no murmurs rubs or gallops, no lower extremity edema.  Respiratory: Clear to auscultation  bilaterally. Not using accessory muscles, speaking in full sentences. Left foot: No visible erythema or swelling. Range of motion is full in all directions. Strength is 5/5 in all directions. No hallux valgus. No pes cavus or pes planus. No abnormal callus noted. No pain over the navicular prominence, or base of fifth metatarsal. Moderate tenderness to palpation of the calcaneal insertion of plantar fascia. No pain at the Achilles insertion. No pain over the calcaneal bursa. No pain of the retrocalcaneal bursa. No tenderness to palpation over the tarsals, metatarsals, or phalanges. No hallux rigidus or limitus. No tenderness palpation over interphalangeal joints. No pain with  compression of the metatarsal heads. Neurovascularly intact distally.  Procedure: Real-time Ultrasound Guided Injection of left plantar fascia origin Device: GE Logiq E  Verbal informed consent obtained.  Time-out conducted.  Noted no overlying erythema, induration, or other signs of local infection.  Skin prepped in a sterile fashion.  Local anesthesia: Topical Ethyl chloride.  With sterile technique and under real time ultrasound guidance: 1 cc Kenalog 40, 1 cc lidocaine, 1 cc bupivacaine injected easily Completed without difficulty  Pain immediately resolved suggesting accurate placement of the medication.  Advised to call if fevers/chills, erythema, induration, drainage, or persistent bleeding.  Images permanently stored and available for review in the ultrasound unit.  Impression: Technically successful ultrasound guided injection.  Impression and Recommendations:    Plantar fasciitis, left She has several sets of orthotics, air heel brace, and has done formal PT. Persistent discomfort, injection as above, return in a month.   ___________________________________________ Gwen Her. Dianah Field, M.D., ABFM., CAQSM. Primary Care and Sports Medicine [facility]  Adjunct Professor of [place] [employer] [facility] [employer]

## 2019-01-10 ENCOUNTER — Ambulatory Visit (INDEPENDENT_AMBULATORY_CARE_PROVIDER_SITE_OTHER): Payer: Medicare Other | Admitting: Sports Medicine

## 2019-01-10 ENCOUNTER — Encounter: Payer: Self-pay | Admitting: Sports Medicine

## 2019-01-10 DIAGNOSIS — M79672 Pain in left foot: Secondary | ICD-10-CM

## 2019-01-10 DIAGNOSIS — M722 Plantar fascial fibromatosis: Secondary | ICD-10-CM | POA: Diagnosis not present

## 2019-01-10 NOTE — Progress Notes (Signed)
Subjective:    CC: Foot pain  HPI: This is a pleasant 68 year old female, she has plantar fasciitis, we injected her at the last visit and she did extremely well for several days, she started walking on concrete and felt a recurrence of pain but not back to the level before the injection.  She is a little bit disheartened in her return of discomfort.  Symptoms are mild, persistent, localized on the plantar aspect of the foot without radiation.  I reviewed the past medical history, family history, social history, surgical history, and allergies today and no changes were needed.  Please see the problem list section below in epic for further details.  Past Medical History: Past Medical History:  Diagnosis Date  . Anxiety   . Diverticular disease   . Hx of diverticulitis of colon 04/04/2017  . Hyperlipidemia, mixed   . Hypothyroidism 2000  . Vitamin D deficiency    Past Surgical History: Past Surgical History:  Procedure Laterality Date  . BREAST BIOPSY Left    benign  . CHOLECYSTECTOMY N/A 02/08/2017   Procedure: LAPAROSCOPIC CHOLECYSTECTOMY WITH INTRAOPERATIVE CHOLANGIOGRAM;  Surgeon: Donnie Mesa, MD;  Location: [facility];  Service: General;  Laterality: N/A;  . EYE SURGERY     Eyelid lifted  . TONSILLECTOMY     Social History: Social History   Socioeconomic History  . Marital status: Married    Spouse name: Not on file  . Number of children: Not on file  . Years of education: Not on file  . Highest education level: Not on file  Occupational History  . Not on file  Social Needs  . Financial resource strain: Not on file  . Food insecurity:    Worry: Not on file    Inability: Not on file  . Transportation needs:    Medical: Not on file    Non-medical: Not on file  Tobacco Use  . Smoking status: Never Smoker  . Smokeless tobacco: Never Used  Substance and Sexual Activity  . Alcohol use: Yes    Comment: occasional   . Drug use: No  . Sexual activity: Yes  Lifestyle   . Physical activity:    Days per week: Not on file    Minutes per session: Not on file  . Stress: Not on file  Relationships  . Social connections:    Talks on phone: Not on file    Gets together: Not on file    Attends religious service: Not on file    Active member of club or organization: Not on file    Attends meetings of clubs or organizations: Not on file    Relationship status: Not on file  Other Topics Concern  . Not on file  Social History Narrative  . Not on file   Family History: Family History  Problem Relation Age of Onset  . Heart disease Mother   . Heart disease Father   . Breast cancer Sister    Allergies: Allergies  Allergen Reactions  . Ciprofloxacin Other (See Comments)    Alleged tendonitis   Medications: See med rec.  Review of Systems: No fevers, chills, night sweats, weight loss, chest pain, or shortness of breath.   Objective:    General: Well Developed, well nourished, and in no acute distress.  Neuro: Alert and oriented x3, extra-ocular muscles intact, sensation grossly intact.  HEENT: Normocephalic, atraumatic, pupils equal round reactive to light, neck supple, no masses, no lymphadenopathy, thyroid nonpalpable.  Skin: Warm and dry, no  rashes. Cardiac: Regular rate and rhythm, no murmurs rubs or gallops, no lower extremity edema.  Respiratory: Clear to auscultation bilaterally. Not using accessory muscles, speaking in full sentences.  Impression and Recommendations:    Plantar fasciitis, left Several sets of orthotics, air heel brace, injection at the last visit, persistent discomfort. She had a good several days but then the pain returned but not completely. Neck step is an MRI to ensure that were not missing something in the diagnosis such as a calcaneal stress injury, if MRI confirms plantar fasciitis we can proceed with PRP percutaneous tenotomy of the plantar fascia origin. I did explain the process to the patient, as well as the  likelihood of severe pain postoperatively. Return to see me to go over MRI results.  I spent 25 minutes with this patient, greater than 50% was face-to-face time counseling regarding the above diagnoses.  ___________________________________________ Gwen Her. Dianah Field, M.D., ABFM., CAQSM. Primary Care and Sports Medicine [facility]  Adjunct Professor of [employer] [facility] [employer]

## 2019-01-10 NOTE — Assessment & Plan Note (Signed)
Several sets of orthotics, air heel brace, injection at the last visit, persistent discomfort. She had a good several days but then the pain returned but not completely. Neck step is an MRI to ensure that were not missing something in the diagnosis such as a calcaneal stress injury, if MRI confirms plantar fasciitis we can proceed with PRP percutaneous tenotomy of the plantar fascia origin. I did explain the process to the patient, as well as the likelihood of severe pain postoperatively. Return to see me to go over MRI results.

## 2019-01-21 ENCOUNTER — Ambulatory Visit (INDEPENDENT_AMBULATORY_CARE_PROVIDER_SITE_OTHER): Payer: Medicare Other

## 2019-01-21 ENCOUNTER — Other Ambulatory Visit: Payer: Self-pay

## 2019-01-21 DIAGNOSIS — M722 Plantar fascial fibromatosis: Secondary | ICD-10-CM

## 2019-01-21 DIAGNOSIS — M79672 Pain in left foot: Secondary | ICD-10-CM | POA: Diagnosis not present

## 2019-01-21 DIAGNOSIS — M24175 Other articular cartilage disorders, left foot: Secondary | ICD-10-CM | POA: Diagnosis not present

## 2019-01-21 DIAGNOSIS — M65872 Other synovitis and tenosynovitis, left ankle and foot: Secondary | ICD-10-CM | POA: Diagnosis not present

## 2019-02-17 ENCOUNTER — Other Ambulatory Visit: Payer: Self-pay

## 2019-02-17 ENCOUNTER — Encounter: Payer: Self-pay | Admitting: Adult Health

## 2019-02-17 ENCOUNTER — Ambulatory Visit (INDEPENDENT_AMBULATORY_CARE_PROVIDER_SITE_OTHER): Payer: Medicare Other | Admitting: Adult Health

## 2019-02-17 VITALS — BP 102/64 | HR 77 | Temp 97.3°F | Ht 61.0 in | Wt 109.0 lb

## 2019-02-17 DIAGNOSIS — L255 Unspecified contact dermatitis due to plants, except food: Secondary | ICD-10-CM

## 2019-02-17 MED ORDER — PREDNISONE 20 MG PO TABS: ORAL_TABLET | ORAL | 0 refills | Status: DC

## 2019-02-17 NOTE — Patient Instructions (Signed)
Poison Ivy Dermatitis  Poison ivy dermatitis is redness and soreness (inflammation) of the skin. It is caused by a chemical that is found on the leaves of the poison ivy plant. You may also have itching, a rash, and blisters. Symptoms often clear up in 1-2 weeks. You may get this condition by touching a poison ivy plant. You can also get it by touching something that has the chemical on it. This may include animals or objects that have come in contact with the plant. Follow these instructions at home: General instructions  Take or apply over-the-counter and prescription medicines only as told by your doctor.  If you touch poison ivy, wash your skin with soap and cold water right away.  Use hydrocortisone creams or calamine lotion as needed to help with itching.  Take oatmeal baths as needed. Use colloidal oatmeal. You can get this at a pharmacy or grocery store. Follow the instructions on the package.  Do not scratch or rub your skin.  While you have the rash, wash your clothes right after you wear them. Prevention   Know what poison ivy looks like so you can avoid it. This plant has three leaves with flowering branches on a single stem. The leaves are glossy. They have uneven edges that come to a point at the front.  If you have touched poison ivy, wash with soap and water right away. Be sure to wash under your fingernails.  When hiking or camping, wear long pants, a long-sleeved shirt, tall socks, and hiking boots. You can also use a lotion on your skin that helps to prevent contact with the chemical on the plant.  If you think that your clothes or outdoor gear came in contact with poison ivy, rinse them off with a garden hose before you bring them inside your house. Contact a doctor if:  You have open sores in the rash area.  You have more redness, swelling, or pain in the affected area.  You have redness that spreads beyond the rash area.  You have fluid, blood, or pus coming  from the affected area.  You have a fever.  You have a rash over a large area of your body.  You have a rash on your eyes, mouth, or genitals.  Your rash does not get better after a few days. Get help right away if:  Your face swells or your eyes swell shut.  You have trouble breathing.  You have trouble swallowing. This information is not intended to replace advice given to you by your health care provider. Make sure you discuss any questions you have with your health care provider. Document Released: 09/16/2010 Document Revised: 05/08/2018 Document Reviewed: 01/20/2015 Elsevier Interactive Patient Education  2019 [place].     Prednisone tablets What is this medicine? PREDNISONE (PRED ni sone) is a corticosteroid. It is commonly used to treat inflammation of the skin, joints, lungs, and other organs. Common conditions treated include asthma, allergies, and arthritis. It is also used for other conditions, such as blood disorders and diseases of the adrenal glands. This medicine may be used for other purposes; ask your health care provider or pharmacist if you have questions. COMMON BRAND NAME(S): Deltasone, Predone, Sterapred, Sterapred DS What should I tell my health care provider before I take this medicine? They need to know if you have any of these conditions: -Cushing's syndrome -diabetes -glaucoma -heart disease -high blood pressure -infection (especially a virus infection such as chickenpox, cold sores, or herpes) -kidney  disease -liver disease -mental illness -myasthenia gravis -osteoporosis -seizures -stomach or intestine problems -thyroid disease -an unusual or allergic reaction to lactose, prednisone, other medicines, foods, dyes, or preservatives -pregnant or trying to get pregnant -breast-feeding How should I use this medicine? Take this medicine by mouth with a glass of water. Follow the directions on the prescription label. Take this medicine with  food. If you are taking this medicine once a day, take it in the morning. Do not take more medicine than you are told to take. Do not suddenly stop taking your medicine because you may develop a severe reaction. Your doctor will tell you how much medicine to take. If your doctor wants you to stop the medicine, the dose may be slowly lowered over time to avoid any side effects. Talk to your pediatrician regarding the use of this medicine in children. Special care may be needed. Overdosage: If you think you have taken too much of this medicine contact a poison control center or emergency room at once. NOTE: This medicine is only for you. Do not share this medicine with others. What if I miss a dose? If you miss a dose, take it as soon as you can. If it is almost time for your next dose, talk to your doctor or health care professional. You may need to miss a dose or take an extra dose. Do not take double or extra doses without advice. What may interact with this medicine? Do not take this medicine with any of the following medications: -metyrapone -mifepristone This medicine may also interact with the following medications: -aminoglutethimide -amphotericin B -aspirin and aspirin-like medicines -barbiturates -certain medicines for diabetes, like glipizide or glyburide -cholestyramine -cholinesterase inhibitors -cyclosporine -digoxin -diuretics -ephedrine -female hormones, like estrogens and birth control pills -isoniazid -ketoconazole -NSAIDS, medicines for pain and inflammation, like ibuprofen or naproxen -phenytoin -rifampin -toxoids -vaccines -warfarin This list may not describe all possible interactions. Give your health care provider a list of all the medicines, herbs, non-prescription drugs, or dietary supplements you use. Also tell them if you smoke, drink alcohol, or use illegal drugs. Some items may interact with your medicine. What should I watch for while using this medicine?  Visit your doctor or health care professional for regular checks on your progress. If you are taking this medicine over a prolonged period, carry an identification card with your name and address, the type and dose of your medicine, and your doctor's name and address. This medicine may increase your risk of getting an infection. Tell your doctor or health care professional if you are around anyone with measles or chickenpox, or if you develop sores or blisters that do not heal properly. If you are going to have surgery, tell your doctor or health care professional that you have taken this medicine within the last twelve months. Ask your doctor or health care professional about your diet. You may need to lower the amount of salt you eat. This medicine may increase blood sugar. Ask your healthcare provider if changes in diet or medicines are needed if you have diabetes. What side effects may I notice from receiving this medicine? Side effects that you should report to your doctor or health care professional as soon as possible: -allergic reactions like skin rash, itching or hives, swelling of the face, lips, or tongue -changes in emotions or moods -changes in vision -depressed mood -eye pain -fever or chills, cough, sore throat, pain or difficulty passing urine -signs and symptoms of high blood sugar  such as being more thirsty or hungry or having to urinate more than normal. You may also feel very tired or have blurry vision. -swelling of ankles, feet Side effects that usually do not require medical attention (report to your doctor or health care professional if they continue or are bothersome): -confusion, excitement, restlessness -headache -nausea, vomiting -skin problems, acne, thin and shiny skin -trouble sleeping -weight gain This list may not describe all possible side effects. Call your doctor for medical advice about side effects. You may report side effects to FDA at 1-800-FDA-1088.  Where should I keep my medicine? Keep out of the reach of children. Store at room temperature between 15 and 30 degrees C (59 and 86 degrees F). Protect from light. Keep container tightly closed. Throw away any unused medicine after the expiration date. NOTE: This sheet is a summary. It may not cover all possible information. If you have questions about this medicine, talk to your doctor, pharmacist, or health care provider.  2019 Elsevier/Gold Standard (2018-05-14 10:54:22)

## 2019-02-17 NOTE — Progress Notes (Signed)
Assessment and Plan:  Isabel Tyler was seen today for poison ivy.  Diagnoses and all orders for this visit:  Dermatitis due to plants, including poison ivy, sumac, and oak  Can use benadryl, calamine lotion, contact precautions and future prevention discussed.  -     predniSONE (DELTASONE) 20 MG tablet; 2 tablets daily for 3 days, 1 tablet daily for 4 days.  Further disposition pending results of labs. Discussed med's effects and SE's.   Over 15 minutes of exam, counseling, chart review, and critical decision making was performed.   Future Appointments  Date Time Provider [place]  07/03/2019 11:00 AM Unk Pinto, MD [facility] None  07/22/2019  2:00 PM McClanahan, Danton Sewer, NP [facility] None    ------------------------------------------------------------------------------------------------------------------   HPI BP 102/64   Pulse 77   Temp (!) 97.3 F (36.3 C)   Ht 5\' 1"  (1.549 m)   Wt 109 lb (49.4 kg)   SpO2 99%   BMI 20.60 kg/m   67 y.o.female presents for evaluation of itchy rash to chest, stomach and bilateral arms that began after she spent a day working in the garden. She has been applying topical benadryl gel, calamine lotion with minimal benefit. She has applied ice with some relief. She has been taking zyrtec after suggestion from pharmacist. Denies discharge, fever/chills, URI sx, headaches, n/v/d, arthralgias/myalgias.   No other individuals in home have rash.   Past Medical History:  Diagnosis Date  . Anxiety   . Diverticular disease   . Hx of diverticulitis of colon 04/04/2017  . Hyperlipidemia, mixed   . Hypothyroidism 2000  . Vitamin D deficiency      Allergies  Allergen Reactions  . Ciprofloxacin Other (See Comments)    Alleged tendonitis    Current Outpatient Medications on File Prior to Visit  Medication Sig  . alendronate (FOSAMAX) 70 MG tablet TAKE 1 TABLET BY MOUTH EVERY WEEK IN THE MORNING ON AN EMPTY STOMACH WITH ONLY A GLASS OF  WATER; NOTHING BY MOUTH FOR 1 HOUR  . Ascorbic Acid (VITAMIN C) 1000 MG tablet Take 1,000 mg by mouth daily.  Marland Kitchen aspirin EC 81 MG tablet Take 81 mg by mouth daily.  . Brimonidine Tartrate 0.33 % GEL Apply 1 application topically daily as needed.  . Calcium Citrate (CITRACAL PO) Take 1 tablet by mouth daily.  . Cholecalciferol (VITAMIN D-3) 5000 units TABS Take 1 tablet by mouth daily.  Marland Kitchen escitalopram (LEXAPRO) 20 MG tablet Take 1 tablet Daily for Mood  . Flaxseed, Linseed, (FLAXSEED OIL) 1200 MG CAPS Take 1 capsule by mouth daily.  Marland Kitchen levothyroxine (SYNTHROID, LEVOTHROID) 75 MCG tablet Take 1 tablet daily with only water on an empty stomach for 30 minutes and no antacids for 4 hours  . Omega-3 Fatty Acids (FISH OIL) 1200 MG CAPS Take 1 capsule by mouth daily.  Marland Kitchen OVER THE COUNTER MEDICATION Patient takes "[place]" organic apple cider vinegar with added herbs and spices. 1-2 TBSP daily.  . Probiotic Product (PROBIOTIC-10) CAPS Take 1 capsule by mouth daily.  . rosuvastatin (CRESTOR) 40 MG tablet TAKE 1/2 TO 1 TABLET BY MOUTH DAILY OR AS DIRECTED FOR CHOLESTEROL  . fluticasone (FLONASE) 50 MCG/ACT nasal spray Place 1 spray into both nostrils daily as needed for allergies or rhinitis.  . meloxicam (MOBIC) 15 MG tablet One tab PO qAM with breakfast for 2 weeks, then daily prn pain. (Patient not taking: Reported on 02/17/2019)   No current facility-administered medications on file prior to visit.  ROS: all negative except above.   Physical Exam:  BP 102/64   Pulse 77   Temp (!) 97.3 F (36.3 C)   Ht 5\' 1"  (1.549 m)   Wt 109 lb (49.4 kg)   SpO2 99%   BMI 20.60 kg/m   General Appearance: Well nourished, in no apparent distress. Eyes: PERRLA, conjunctiva no swelling or erythema ENT/Mouth: No erythema, swelling, or exudate on post pharynx.  Tonsils not swollen or erythematous. Hearing normal.  Neck: Supple  Respiratory: Respiratory effort normal, BS equal bilaterally without  rales, rhonchi, wheezing or stridor.  Cardio: RRR with no MRGs.  Abdomen: Soft, + BS.  Non tender Lymphatics: Non tender without lymphadenopathy.  Musculoskeletal: normal gait.  Skin: Warm, dry; she has mild scattered erythematous maculopapular rash without discharge Neuro: Sensation intact.  Psych: Awake and oriented X 3, normal affect, Insight and Judgment appropriate.     Izora Ribas, NP 2:55 PM [facility]

## 2019-03-18 DIAGNOSIS — Z124 Encounter for screening for malignant neoplasm of cervix: Secondary | ICD-10-CM | POA: Diagnosis not present

## 2019-03-18 DIAGNOSIS — Z1231 Encounter for screening mammogram for malignant neoplasm of breast: Secondary | ICD-10-CM | POA: Diagnosis not present

## 2019-03-18 DIAGNOSIS — Z6821 Body mass index (BMI) 21.0-21.9, adult: Secondary | ICD-10-CM | POA: Diagnosis not present

## 2019-03-19 LAB — HM MAMMOGRAPHY

## 2019-04-22 ENCOUNTER — Other Ambulatory Visit: Payer: Self-pay

## 2019-04-22 ENCOUNTER — Ambulatory Visit (INDEPENDENT_AMBULATORY_CARE_PROVIDER_SITE_OTHER): Payer: Medicare Other | Admitting: Adult Health Nurse Practitioner

## 2019-04-22 ENCOUNTER — Encounter: Payer: Self-pay | Admitting: Adult Health Nurse Practitioner

## 2019-04-22 VITALS — BP 102/60 | HR 76 | Temp 97.4°F | Resp 16 | Ht 61.0 in | Wt 109.0 lb

## 2019-04-22 DIAGNOSIS — K5730 Diverticulosis of large intestine without perforation or abscess without bleeding: Secondary | ICD-10-CM

## 2019-04-22 DIAGNOSIS — R1032 Left lower quadrant pain: Secondary | ICD-10-CM

## 2019-04-22 MED ORDER — DICYCLOMINE HCL 20 MG PO TABS: ORAL_TABLET | ORAL | 0 refills | Status: DC

## 2019-04-22 NOTE — Progress Notes (Signed)
Assessment and Plan:  Jehan was seen today for abdominal pain.  Discussed short duration of symptoms.  Discussed symptom management.  Discussed contacting office should symptoms return or prolonged.  Discussed not doing antibiotics related to short duration and decrease in symptoms at this time.  Diagnoses and all orders for this visit:  Diverticulosis of colon without hemorrhage Continue to monitor Discussed dietary and exercise modifications Discussed bland diet and stay hydrated.  LLQ cramping -     dicyclomine (BENTYL) 20 MG tablet; Take one tablet by mouth at onset of cramps and/or with meals to decrease intestinal spasms.  Take as needed.  Discussed when to call or return with new or worsening symptoms.    Further disposition pending results of labs. Discussed med's effects and SE's.   Over 30 minutes of exam, counseling, chart review, and critical decision making was performed.   Future Appointments  Date Time Provider [place]  07/03/2019 11:00 AM Unk Pinto, MD [facility] None  07/22/2019  2:00 PM [name], NP [facility] None    ------------------------------------------------------------------------------------------------------------------   HPI 68 y.o.female presents for evaluation of abdominal discomfort.  She reports her symptoms started 3 days ago.  She was having some mild pain in lower left lower quantrandt pain.  She took some smooth move tea thinking she was constipated.  She has some severe cramping on left side followod by three episodes of loose water stools.  She has been able to keep liquids down.  She had some scrambled egg and yogurt this morning and had diarrhea after this.  She reports she is feeling weak.    Now she is having lots of gas with some left sided tenderness.   She did take some ibuprofen but reports this did not help. Past Medical History:  Diagnosis Date  . Anxiety   . Diverticular disease   . Hx of diverticulitis  of colon 04/04/2017  . Hyperlipidemia, mixed   . Hypothyroidism 2000  . Vitamin D deficiency      Allergies  Allergen Reactions  . Ciprofloxacin Other (See Comments)    Alleged tendonitis    Current Outpatient Medications on File Prior to Visit  Medication Sig  . alendronate (FOSAMAX) 70 MG tablet TAKE 1 TABLET BY MOUTH EVERY WEEK IN THE MORNING ON AN EMPTY STOMACH WITH ONLY A GLASS OF WATER; NOTHING BY MOUTH FOR 1 HOUR  . Ascorbic Acid (VITAMIN C) 1000 MG tablet Take 1,000 mg by mouth daily.  Marland Kitchen aspirin EC 81 MG tablet Take 81 mg by mouth daily.  . Brimonidine Tartrate 0.33 % GEL Apply 1 application topically daily as needed.  . Calcium Citrate (CITRACAL PO) Take 1 tablet by mouth daily.  . Cholecalciferol (VITAMIN D-3) 5000 units TABS Take 1 tablet by mouth daily.  Marland Kitchen escitalopram (LEXAPRO) 20 MG tablet Take 1 tablet Daily for Mood  . Flaxseed, Linseed, (FLAXSEED OIL) 1200 MG CAPS Take 1 capsule by mouth daily.  Marland Kitchen levothyroxine (SYNTHROID, LEVOTHROID) 75 MCG tablet Take 1 tablet daily with only water on an empty stomach for 30 minutes and no antacids for 4 hours  . Omega-3 Fatty Acids (FISH OIL) 1200 MG CAPS Take 1 capsule by mouth daily.  Marland Kitchen OVER THE COUNTER MEDICATION Patient takes "[place]" organic apple cider vinegar with added herbs and spices. 1-2 TBSP daily.  . Probiotic Product (PROBIOTIC-10) CAPS Take 1 capsule by mouth daily.  . rosuvastatin (CRESTOR) 40 MG tablet TAKE 1/2 TO 1 TABLET BY MOUTH DAILY OR AS  DIRECTED FOR CHOLESTEROL   No current facility-administered medications on file prior to visit.     ROS: all negative except above.  Review of Systems  Constitutional: Positive for chills and malaise/fatigue. Negative for diaphoresis, fever and weight loss.  Gastrointestinal: Positive for abdominal pain and diarrhea. Negative for blood in stool, constipation, heartburn, melena, nausea and vomiting.  Genitourinary: Negative for dysuria, flank pain, frequency,  hematuria and urgency.  Skin: Negative for itching and rash.    Physical Exam:  BP 102/60   Pulse 76   Temp (!) 97.4 F (36.3 C)   Resp 16   Ht 5\' 1"  (1.549 m)   Wt 109 lb (49.4 kg)   BMI 20.60 kg/m   General Appearance: Well nourished, in no apparent distress. Eyes: PERRLA, EOMs, conjunctiva no swelling or erythema Sinuses: No Frontal/maxillary tenderness ENT/Mouth: Ext aud canals clear, TMs without erythema, bulging. No erythema, swelling, or exudate on post pharynx.  Tonsils not swollen or erythematous. Hearing normal.  Neck: Supple, thyroid normal.  Respiratory: Respiratory effort normal, BS equal bilaterally without rales, rhonchi, wheezing or stridor.  Cardio: RRR with no MRGs. Brisk peripheral pulses without edema.  Abdomen: Soft, hyperactive BS in all quadrants.  Tender to LLQ, no guarding, rebound, hernias, masses. Lymphatics: Non tender without lymphadenopathy.  Musculoskeletal: Full ROM, 5/5 strength, normal gait.  Skin: Warm, dry without rashes, lesions, ecchymosis.  Neuro: Cranial nerves intact. Normal muscle tone, no cerebellar symptoms. Sensation intact.  Psych: Awake and oriented X 3, normal affect, Insight and Judgment appropriate.     [name], NP 3:53 PM [facility]

## 2019-04-22 NOTE — Patient Instructions (Addendum)
We will send in bentyl (dicyclomine) for you to use when you have cramping.  You can also take this with a meal.  Start eating bland foods. You may drink gatorade and drink lots of water.  At least 80oz a day.  Monitor how many bowel movements.  Bland Diet A bland diet consists of foods that are often soft and do not have a lot of fat, fiber, or extra seasonings. Foods without fat, fiber, or seasoning are easier for the body to digest. They are also less likely to irritate your mouth, throat, stomach, and other parts of your digestive system. A bland diet is sometimes called a BRAT diet. What is my plan? Your health care provider or food and nutrition specialist (dietitian) may recommend specific changes to your diet to prevent symptoms or to treat your symptoms. These changes may include:  Eating small meals often.  Cooking food until it is soft enough to chew easily.  Chewing your food well.  Drinking fluids slowly.  Not eating foods that are very spicy, sour, or fatty.  Not eating citrus fruits, such as oranges and grapefruit. What do I need to know about this diet?  Eat a variety of foods from the bland diet food list.  Do not follow a bland diet longer than needed.  Ask your health care provider whether you should take vitamins or supplements. What foods can I eat? Grains  Hot cereals, such as cream of wheat. Rice. Bread, crackers, or tortillas made from refined white flour. Vegetables Canned or cooked vegetables. Mashed or boiled potatoes. Fruits  Bananas. Applesauce. Other types of cooked or canned fruit with the skin and seeds removed, such as canned peaches or pears. Meats and other proteins  Scrambled eggs. Creamy peanut butter or other nut butters. Lean, well-cooked meats, such as chicken or fish. Tofu. Soups or broths. Dairy Low-fat dairy products, such as milk, cottage cheese, or yogurt. Beverages  Water. Herbal tea.  Fats and oils Mild salad  dressings. Canola or olive oil. Sweets and desserts Pudding. Custard. Fruit gelatin. Ice cream. The items listed above may not be a complete list of recommended foods and beverages. Contact a dietitian for more options. What foods are not recommended? Grains Whole grain breads and cereals. Vegetables Raw vegetables. Fruits Raw fruits, especially citrus, berries, or dried fruits. Dairy Whole fat dairy foods. Beverages Caffeinated drinks. Alcohol. Seasonings and condiments Strongly flavored seasonings or condiments. Hot sauce. Salsa. Other foods Spicy foods. Fried foods. Sour foods, such as pickled or fermented foods. Foods with high sugar content. Foods high in fiber. The items listed above may not be a complete list of foods and beverages to avoid. Contact a dietitian for more information. Summary  A bland diet consists of foods that are often soft and do not have a lot of fat, fiber, or extra seasonings.  Foods without fat, fiber, or seasoning are easier for the body to digest.  Check with your health care provider to see how long you should follow this diet plan. It is not meant to be followed for long periods. This information is not intended to replace advice given to you by your health care provider. Make sure you discuss any questions you have with your health care provider. Document Released: 12/06/2015 Document Revised: 09/12/2017 Document Reviewed: 09/12/2017 Else[employer] Patient Education  [place].    Diverticulitis diet  High-fiber foods include: Beans and legumes  Bran, whole wheat bread and whole grain cereals such as oatmeal  [place] and wild rice  Fruits such as apples, bananas and pears  Vegetables such as broccoli, carrots, corn and squash  Whole wheat pasta   Should you have any new or worsening symptoms or cramping please let the office know.

## 2019-07-02 ENCOUNTER — Encounter: Payer: Self-pay | Admitting: Internal Medicine

## 2019-07-02 NOTE — Patient Instructions (Signed)
Vit D   & Vit C 1,000 mg   are recommended to help protect  against the Covid-19 and other Corona viruses.    Also it's recommended  to take  Zinc 50 mg  to help  protect against the Covid-19   and best place to get  is also on [email or web address]  and don't pay more than 3-5 cents /pill !  ================================ Coronavirus (COVID-19) Are you at risk?  Are you at risk for the Coronavirus (COVID-19)?  To be considered HIGH RISK for Coronavirus (COVID-19), you have to meet the following criteria:  . Traveled to [place], [place], [place], [place] or [place]; or in the [place] to [place], [place], [name]  . or [place]; and have fever, cough, and shortness of breath within the last 2 weeks of travel OR . Been in close contact with a person diagnosed with COVID-19 within the last 2 weeks and have  . fever, cough,and shortness of breath .  . IF YOU DO NOT MEET THESE CRITERIA, YOU ARE CONSIDERED LOW RISK FOR COVID-19.  What to do if you are HIGH RISK for COVID-19?  Marland Kitchen If you are having a medical emergency, call 911. . Seek medical care right away. Before you go to a doctor's office, urgent care or emergency department, .  call ahead and tell them about your recent travel, contact with someone diagnosed with COVID-19  .  and your symptoms.  . You should receive instructions from your physician's office regarding next steps of care.  . When you arrive at healthcare provider, tell the healthcare staff immediately you have returned from  . visiting [place], [place], [place], [place] or [place]; or traveled in the [place] to [place], [place],  . [place] or [place] in the last two weeks or you have been in close contact with a person diagnosed with  . COVID-19 in the last 2 weeks.   . Tell the health care staff about your symptoms: fever, cough and shortness of breath. . After you have been seen by a medical provider, you will be either: o Tested for (COVID-19)  and discharged home on quarantine except to seek medical care if  o symptoms worsen, and asked to  - Stay home and avoid contact with others until you get your results (4-5 days)  - Avoid travel on public transportation if possible (such as bus, train, or airplane) or o Sent to the Emergency Department by EMS for evaluation, COVID-19 testing  and  o possible admission depending on your condition and test results.  What to do if you are LOW RISK for COVID-19?  Reduce your risk of any infection by using the same precautions used for avoiding the common cold or flu:  Marland Kitchen Wash your hands often with soap and warm water for at least 20 seconds.  If soap and water are not readily available,  . use an alcohol-based hand sanitizer with at least 60% alcohol.  . If coughing or sneezing, cover your mouth and nose by coughing or sneezing into the elbow areas of your shirt or coat, .  into a tissue or into your sleeve (not your hands). . Avoid shaking hands with others and consider head nods or verbal greetings only. . Avoid touching your eyes, nose, or mouth with unwashed hands.  . Avoid close contact with people who are sick. . Avoid places or events with large numbers of people in one location, like concerts or sporting events. Marland Kitchen  Carefully consider travel plans you have or are making. . If you are planning any travel outside or inside the [place], visit the CDC's Travelers' Health webpage for the latest health notices. . If you have some symptoms but not all symptoms, continue to monitor at home and seek medical attention  . if your symptoms worsen. . If you are having a medical emergency, call 911.   . >>>>>>>>>>>>>>>>>>>>>>>>>>>>>>>>> . We Do NOT Approve of  [facility] [place] Soliciting Our Patients  To Do Home Visits & We Do NOT Approve of [employer] > > > > > > > > > > > > > > > > > > > > > > > > > > > > > > > > > > > > > > >  Preventive Care for Adults  A healthy lifestyle  and preventive care can promote health and wellness. Preventive health guidelines for women include the following key practices.  A routine yearly physical is a good way to check with your health care provider about your health and preventive screening. It is a chance to share any concerns and updates on your health and to receive a thorough exam.  Visit your dentist for a routine exam and preventive care every 6 months. Brush your teeth twice a day and floss once a day. Good oral hygiene prevents tooth decay and gum disease.  The frequency of eye exams is based on your age, health, family medical history, use of contact lenses, and other factors. Follow your health care provider's recommendations for frequency of eye exams.  Eat a healthy diet. Foods like vegetables, fruits, whole grains, low-fat dairy products, and lean protein foods contain the nutrients you need without too many calories. Decrease your intake of foods high in solid fats, added sugars, and salt. Eat the right amount of calories for you. Get information about a proper diet from your health care provider, if necessary.  Regular physical exercise is one of the most important things you can do for your health. Most adults should get at least 150 minutes of moderate-intensity exercise (any activity that increases your heart rate and causes you to sweat) each week. In addition, most adults need muscle-strengthening exercises on 2 or more days a week.  Maintain a healthy weight. The body mass index (BMI) is a screening tool to identify possible weight problems. It provides an estimate of body fat based on height and weight. Your health care provider can find your BMI and can help you achieve or maintain a healthy weight. For adults 20 years and older:  A BMI below 18.5 is considered underweight.  A BMI of 18.5 to 24.9 is normal.  A BMI of 25 to 29.9 is considered overweight.  A BMI of 30 and above is considered obese.  Maintain  normal blood lipids and cholesterol levels by exercising and minimizing your intake of saturated fat. Eat a balanced diet with plenty of fruit and vegetables. If your lipid or cholesterol levels are high, you are over 50, or you are at high risk for heart disease, you may need your cholesterol levels checked more frequently. Ongoing high lipid and cholesterol levels should be treated with medicines if diet and exercise are not working.  If you smoke, find out from your health care provider how to quit. If you do not use tobacco, do not start.  Lung cancer screening is recommended for adults aged 55-80 years who are at high risk for developing lung cancer   because of a history of smoking. A yearly low-dose CT scan of the lungs is recommended for people who have at least a 30-pack-year history of smoking and are a current smoker or have quit within the past 15 years. A pack year of smoking is smoking an average of 1 pack of cigarettes a day for 1 year (for example: 1 pack a day for 30 years or 2 packs a day for 15 years). Yearly screening should continue until the smoker has stopped smoking for at least 15 years. Yearly screening should be stopped for people who develop a health problem that would prevent them from having lung cancer treatment.  Avoid use of street drugs. Do not share needles with anyone. Ask for help if you need support or instructions about stopping the use of drugs.  High blood pressure causes heart disease and increases the risk of stroke.  Ongoing high blood pressure should be treated with medicines if weight loss and exercise do not work.  If you are 55-79 years old, ask your health care provider if you should take aspirin to prevent strokes.  Diabetes screening involves taking a blood sample to check your fasting blood sugar level. This should be done once every 3 years, after age 45, if you are within normal weight and without risk factors for diabetes. Testing should be considered  at a younger age or be carried out more frequently if you are overweight and have at least 1 risk factor for diabetes.  Breast cancer screening is essential preventive care for women. You should practice "breast self-awareness." This means understanding the normal appearance and feel of your breasts and may include breast self-examination. Any changes detected, no matter how small, should be reported to a health care provider. Women in their 20s and 30s should have a clinical breast exam (CBE) by a health care provider as part of a regular health exam every 1 to 3 years. After age 40, women should have a CBE every year. Starting at age 40, women should consider having a mammogram (breast X-ray test) every year. Women who have a family history of breast cancer should talk to their health care provider about genetic screening. Women at a high risk of breast cancer should talk to their health care providers about having an MRI and a mammogram every year.  Breast cancer gene (BRCA)-related cancer risk assessment is recommended for women who have family members with BRCA-related cancers. BRCA-related cancers include breast, ovarian, tubal, and peritoneal cancers. Having family members with these cancers may be associated with an increased risk for harmful changes (mutations) in the breast cancer genes BRCA1 and BRCA2. Results of the assessment will determine the need for genetic counseling and BRCA1 and BRCA2 testing.  Routine pelvic exams to screen for cancer are no longer recommended for nonpregnant women who are considered low risk for cancer of the pelvic organs (ovaries, uterus, and vagina) and who do not have symptoms. Ask your health care provider if a screening pelvic exam is right for you.  If you have had past treatment for cervical cancer or a condition that could lead to cancer, you need Pap tests and screening for cancer for at least 20 years after your treatment. If Pap tests have been discontinued,  your risk factors (such as having a new sexual partner) need to be reassessed to determine if screening should be resumed. Some women have medical problems that increase the chance of getting cervical cancer. In these cases, your health care   provider may recommend more frequent screening and Pap tests.    Colorectal cancer can be detected and often prevented. Most routine colorectal cancer screening begins at the age of 50 years and continues through age 75 years. However, your health care provider may recommend screening at an earlier age if you have risk factors for colon cancer. On a yearly basis, your health care provider may provide home test kits to check for hidden blood in the stool. Use of a small camera at the end of a tube, to directly examine the colon (sigmoidoscopy or colonoscopy), can detect the earliest forms of colorectal cancer. Talk to your health care provider about this at age 50, when routine screening begins.  Direct exam of the colon should be repeated every 5-10 years through age 75 years, unless early forms of pre-cancerous polyps or small growths are found.  Osteoporosis is a disease in which the bones lose minerals and strength with aging. This can result in serious bone fractures or breaks. The risk of osteoporosis can be identified using a bone density scan. Women ages 65 years and over and women at risk for fractures or osteoporosis should discuss screening with their health care providers. Ask your health care provider whether you should take a calcium supplement or vitamin D to reduce the rate of osteoporosis.  Menopause can be associated with physical symptoms and risks. Hormone replacement therapy is available to decrease symptoms and risks. You should talk to your health care provider about whether hormone replacement therapy is right for you.  Use sunscreen. Apply sunscreen liberally and repeatedly throughout the day. You should seek shade when your shadow is shorter  than you. Protect yourself by wearing long sleeves, pants, a wide-brimmed hat, and sunglasses year round, whenever you are outdoors.  Once a month, do a whole body skin exam, using a mirror to look at the skin on your back. Tell your health care provider of new moles, moles that have irregular borders, moles that are larger than a pencil eraser, or moles that have changed in shape or color.  Stay current with required vaccines (immunizations).  Influenza vaccine. All adults should be immunized every year.  Tetanus, diphtheria, and acellular pertussis (Td, Tdap) vaccine. Pregnant women should receive 1 dose of Tdap vaccine during each pregnancy. The dose should be obtained regardless of the length of time since the last dose. Immunization is preferred during the 27th-36th week of gestation. An adult who has not previously received Tdap or who does not know her vaccine status should receive 1 dose of Tdap. This initial dose should be followed by tetanus and diphtheria toxoids (Td) booster doses every 10 years. Adults with an unknown or incomplete history of completing a 3-dose immunization series with Td-containing vaccines should begin or complete a primary immunization series including a Tdap dose. Adults should receive a Td booster every 10 years.    Zoster vaccine. One dose is recommended for adults aged 60 years or older unless certain conditions are present.    Pneumococcal 13-valent conjugate (PCV13) vaccine. When indicated, a person who is uncertain of her immunization history and has no record of immunization should receive the PCV13 vaccine. An adult aged 19 years or older who has certain medical conditions and has not been previously immunized should receive 1 dose of PCV13 vaccine. This PCV13 should be followed with a dose of pneumococcal polysaccharide (PPSV23) vaccine. The PPSV23 vaccine dose should be obtained at least 1 or more year(s) after the dose of   PCV13 vaccine. An adult aged 19  years or older who has certain medical conditions and previously received 1 or more doses of PPSV23 vaccine should receive 1 dose of PCV13. The PCV13 vaccine dose should be obtained 1 or more years after the last PPSV23 vaccine dose.    Pneumococcal polysaccharide (PPSV23) vaccine. When PCV13 is also indicated, PCV13 should be obtained first. All adults aged 65 years and older should be immunized. An adult younger than age 65 years who has certain medical conditions should be immunized. Any person who resides in a nursing home or long-term care facility should be immunized. An adult smoker should be immunized. People with an immunocompromised condition and certain other conditions should receive both PCV13 and PPSV23 vaccines. People with human immunodeficiency virus (HIV) infection should be immunized as soon as possible after diagnosis. Immunization during chemotherapy or radiation therapy should be avoided. Routine use of PPSV23 vaccine is not recommended for American Indians, [name] Natives, or people younger than 65 years unless there are medical conditions that require PPSV23 vaccine. When indicated, people who have unknown immunization and have no record of immunization should receive PPSV23 vaccine. One-time revaccination 5 years after the first dose of PPSV23 is recommended for people aged 19-64 years who have chronic kidney failure, nephrotic syndrome, asplenia, or immunocompromised conditions. People who received 1-2 doses of PPSV23 before age 65 years should receive another dose of PPSV23 vaccine at age 65 years or later if at least 5 years have passed since the previous dose. Doses of PPSV23 are not needed for people immunized with PPSV23 at or after age 65 years.   Preventive Services / Frequency  Ages 65 years and over  Blood pressure check.  Lipid and cholesterol check.  Lung cancer screening. / Every year if you are aged 55-80 years and have a 30-pack-year history of smoking and  currently smoke or have quit within the past 15 years. Yearly screening is stopped once you have quit smoking for at least 15 years or develop a health problem that would prevent you from having lung cancer treatment.  Clinical breast exam.** / Every year after age 40 years.   BRCA-related cancer risk assessment.** / For women who have family members with a BRCA-related cancer (breast, ovarian, tubal, or peritoneal cancers).  Mammogram.** / Every year beginning at age 40 years and continuing for as long as you are in good health. Consult with your health care provider.  Pap test.** / Every 3 years starting at age 30 years through age 65 or 70 years with 3 consecutive normal Pap tests. Testing can be stopped between 65 and 70 years with 3 consecutive normal Pap tests and no abnormal Pap or HPV tests in the past 10 years.  Fecal occult blood test (FOBT) of stool. / Every year beginning at age 50 years and continuing until age 75 years. You may not need to do this test if you get a colonoscopy every 10 years.  Flexible sigmoidoscopy or colonoscopy.** / Every 5 years for a flexible sigmoidoscopy or every 10 years for a colonoscopy beginning at age 50 years and continuing until age 75 years.  Hepatitis C blood test.** / For all people born from 1945 through 1965 and any individual with known risks for hepatitis C.  Osteoporosis screening.** / A one-time screening for women ages 65 years and over and women at risk for fractures or osteoporosis.  Skin self-exam. / Monthly.  Influenza vaccine. / Every year.  Tetanus, diphtheria, and acellular   pertussis (Tdap/Td) vaccine.** / 1 dose of Td every 10 years.  Zoster vaccine.** / 1 dose for adults aged 60 years or older.  Pneumococcal 13-valent conjugate (PCV13) vaccine.** / Consult your health care provider.  Pneumococcal polysaccharide (PPSV23) vaccine.** / 1 dose for all adults aged 65 years and older. Screening for abdominal aortic aneurysm (AAA)   by ultrasound is recommended for people who have history of high blood pressure or who are current or former smokers. ++++++++++++++++++++ Recommend Adult Low Dose Aspirin or  coated  Aspirin 81 mg daily  To reduce risk of Colon Cancer 40 %,  Skin Cancer 26 % ,  Melanoma 46%  and  Pancreatic cancer 60% ++++++++++++++++++++ Vitamin D goal  is between 70-100.  Please make sure that you are taking your Vitamin D as directed.  It is very important as a natural anti-inflammatory  helping hair, skin, and nails, as well as reducing stroke and heart attack risk.  It helps your bones and helps with mood. It also decreases numerous cancer risks so please take it as directed.  Low Vit D is associated with a 200-300% higher risk for CANCER  and 200-300% higher risk for HEART   ATTACK  &  STROKE.   ...................................... It is also associated with higher death rate at younger ages,  autoimmune diseases like Rheumatoid arthritis, Lupus, Multiple Sclerosis.    Also many other serious conditions, like depression, Alzheimer's Dementia, infertility, muscle aches, fatigue, fibromyalgia - just to name a few. ++++++++++++++++++ Recommend the book "The END of DIETING" by Dr Joel Fuhrman  & the book "The END of DIABETES " by Dr Joel Fuhrman At [employer]com - get book & Audio CD's    Being diabetic has a  300% increased risk for heart attack, stroke, cancer, and alzheimer- type vascular dementia. It is very important that you work harder with diet by avoiding all foods that are white. Avoid white rice (brown & wild rice is OK), white potatoes (sweetpotatoes in moderation is OK), White bread or wheat bread or anything made out of white flour like bagels, donuts, rolls, buns, biscuits, cakes, pastries, cookies, pizza crust, and pasta (made from white flour & egg whites) - vegetarian pasta or spinach or wheat pasta is OK. Multigrain breads like Arnold's or [employer], or multigrain sandwich thins  or flatbreads.  Diet, exercise and weight loss can reverse and cure diabetes in the early stages.  Diet, exercise and weight loss is very important in the control and prevention of complications of diabetes which affects every system in your body, ie. Brain - dementia/stroke, eyes - glaucoma/blindness, heart - heart attack/heart failure, kidneys - dialysis, stomach - gastric paralysis, intestines - malabsorption, nerves - severe painful neuritis, circulation - gangrene & loss of a leg(s), and finally cancer and Alzheimers.    I recommend avoid fried & greasy foods,  sweets/candy, white rice (brown or wild rice or Quinoa is OK), white potatoes (sweet potatoes are OK) - anything made from white flour - bagels, doughnuts, rolls, buns, biscuits,white and wheat breads, pizza crust and traditional pasta made of white flour & egg white(vegetarian pasta or spinach or wheat pasta is OK).  Multi-grain bread is OK - like multi-grain flat bread or sandwich thins. Avoid alcohol in excess. Exercise is also important.    Eat all the vegetables you want - avoid meat, especially red meat and dairy - especially cheese.  Cheese is the most concentrated form of trans-fats which is the worst thing to clog   up our arteries. Veggie cheese is OK which can be found in the fresh produce section at [employer] or [employer] or [employer]  +++++++++++++++++++ DASH Eating Plan  DASH stands for "Dietary Approaches to Stop Hypertension."   The DASH eating plan is a healthy eating plan that has been shown to reduce high blood pressure (hypertension). Additional health benefits may include reducing the risk of type 2 diabetes mellitus, heart disease, and stroke. The DASH eating plan may also help with weight loss. WHAT DO I NEED TO KNOW ABOUT THE DASH EATING PLAN? For the DASH eating plan, you will follow these general guidelines:  Choose foods with a percent daily value for sodium of less than 5% (as listed on the food  label).  Use salt-free seasonings or herbs instead of table salt or sea salt.  Check with your health care provider or pharmacist before using salt substitutes.  Eat lower-sodium products, often labeled as "lower sodium" or "no salt added."  Eat fresh foods.  Eat more vegetables, fruits, and low-fat dairy products.  Choose whole grains. Look for the word "whole" as the first word in the ingredient list.  Choose fish   Limit sweets, desserts, sugars, and sugary drinks.  Choose heart-healthy fats.  Eat veggie cheese   Eat more home-cooked food and less restaurant, buffet, and fast food.  Limit fried foods.  Cook foods using methods other than frying.  Limit canned vegetables. If you do use them, rinse them well to decrease the sodium.  When eating at a restaurant, ask that your food be prepared with less salt, or no salt if possible.                      WHAT FOODS CAN I EAT? Read Dr Joel Fuhrman's books on The End of Dieting & The End of Diabetes  Grains Whole grain or whole wheat bread. Brown rice. Whole grain or whole wheat pasta. Quinoa, bulgur, and whole grain cereals. Low-sodium cereals. Corn or whole wheat flour tortillas. Whole grain cornbread. Whole grain crackers. Low-sodium crackers.  Vegetables Fresh or frozen vegetables (raw, steamed, roasted, or grilled). Low-sodium or reduced-sodium tomato and vegetable juices. Low-sodium or reduced-sodium tomato sauce and paste. Low-sodium or reduced-sodium canned vegetables.   Fruits All fresh, canned (in natural juice), or frozen fruits.  Protein Products  All fish and seafood.  Dried beans, peas, or lentils. Unsalted nuts and seeds. Unsalted canned beans.  Dairy Low-fat dairy products, such as skim or 1% milk, 2% or reduced-fat cheeses, low-fat ricotta or cottage cheese, or plain low-fat yogurt. Low-sodium or reduced-sodium cheeses.  Fats and Oils Tub margarines without trans fats. Light or reduced-fat mayonnaise  and salad dressings (reduced sodium). Avocado. Safflower, olive, or canola oils. Natural peanut or almond butter.  Other Unsalted popcorn and pretzels. The items listed above may not be a complete list of recommended foods or beverages. Contact your dietitian for more options.  +++++++++++++++  WHAT FOODS ARE NOT RECOMMENDED? Grains/ White flour or wheat flour White bread. White pasta. White rice. Refined cornbread. Bagels and croissants. Crackers that contain trans fat.  Vegetables  Creamed or fried vegetables. Vegetables in a . Regular canned vegetables. Regular canned tomato sauce and paste. Regular tomato and vegetable juices.  Fruits Dried fruits. Canned fruit in light or heavy syrup. Fruit juice.  Meat and Other Protein Products Meat in general - RED meat & White meat.  Fatty cuts of meat. Ribs, chicken wings, all processed meats as   bacon, sausage, bologna, salami, fatback, hot dogs, bratwurst and packaged luncheon meats.  Dairy Whole or 2% milk, cream, half-and-half, and cream cheese. Whole-fat or sweetened yogurt. Full-fat cheeses or blue cheese. Non-dairy creamers and whipped toppings. Processed cheese, cheese spreads, or cheese curds.  Condiments Onion and garlic salt, seasoned salt, table salt, and sea salt. Canned and packaged gravies. Worcestershire sauce. Tartar sauce. Barbecue sauce. Teriyaki sauce. Soy sauce, including reduced sodium. Steak sauce. Fish sauce. Oyster sauce. Cocktail sauce. Horseradish. Ketchup and mustard. Meat flavorings and tenderizers. Bouillon cubes. Hot sauce. Tabasco sauce. Marinades. Taco seasonings. Relishes.  Fats and Oils Butter, stick margarine, lard, shortening and bacon fat. Coconut, palm kernel, or palm oils. Regular salad dressings.  Pickles and olives. Salted popcorn and pretzels.  The items listed above may not be a complete list of foods and beverages to avoid.

## 2019-07-02 NOTE — Progress Notes (Signed)
Annual Screening/Preventative Visit & Comprehensive Evaluation &  Examination     This very nice 68 y.o. MWF presents for a Screening /Preventative Visit & comprehensive evaluation and management of multiple medical co-morbidities.  Patient has been followed expectantly  for labile elevated BP, HLD, Hypothyroid, glucose intolerance  and Vitamin D Deficiency. Patient has hx/o dysthymia & has been well on Lexapro for about 17 years.       labile HTN predates since 2000.  Patient has hx/o negative stress Myoview tests x 2 - last in 2015.  Patient's BP has been controlled at home and patient denies any cardiac symptoms as chest pain, palpitations, shortness of breath, dizziness or ankle swelling. Today's BP is at goal - 108/62.      Patient's hyperlipidemia is controlled with diet and Rosuvastatin. Patient denies myalgias or other medication SE's. Last lipids were at goal:  Lab Results  Component Value Date   CHOL 174 12/12/2018   HDL 67 12/12/2018   LDLCALC 91 12/12/2018   TRIG 69 12/12/2018   CHOLHDL 2.6 12/12/2018      Patient is monitored expectantly for glucose intolerance and patient denies reactive hypoglycemic symptoms, visual blurring, diabetic polys or paresthesias. Last A1c was Normal & at goal:  Lab Results  Component Value Date   HGBA1C 5.4 06/06/2018      Patient was dx'd Hypothyroid in 2000 & initiated on Thyroid Replacement.     Finally, patient has history of Vitamin D Deficiency ("25" / 2017)  and last Vitamin D was at goal:  Lab Results  Component Value Date   VD25OH 77 06/06/2018   Current Outpatient Medications on File Prior to Visit  Medication Sig  . alendronate (FOSAMAX) 70 MG tablet TAKE 1 TABLET BY MOUTH EVERY WEEK IN THE MORNING ON AN EMPTY STOMACH WITH ONLY A GLASS OF WATER; NOTHING BY MOUTH FOR 1 HOUR  . Ascorbic Acid (VITAMIN C) 1000 MG tablet Take 1,000 mg by mouth daily.  Marland Kitchen aspirin EC 81 MG tablet Take 81 mg by mouth daily.  . Brimonidine Tartrate  0.33 % GEL Apply 1 application topically daily as needed.  . Calcium Citrate (CITRACAL PO) Take 1 tablet by mouth daily.  . Cholecalciferol (VITAMIN D-3) 5000 units TABS Take 1 tablet by mouth daily.  Marland Kitchen escitalopram (LEXAPRO) 20 MG tablet Take 1 tablet Daily for Mood  . Flaxseed, Linseed, (FLAXSEED OIL) 1200 MG CAPS Take 1 capsule by mouth daily.  Marland Kitchen levothyroxine (SYNTHROID, LEVOTHROID) 75 MCG tablet Take 1 tablet daily with only water on an empty stomach for 30 minutes and no antacids for 4 hours  . Omega-3 Fatty Acids (FISH OIL) 1200 MG CAPS Take 1 capsule by mouth daily.  Marland Kitchen OVER THE COUNTER MEDICATION Patient takes "Strenght of [place]" organic apple cider vinegar with added herbs and spices. 1-2 TBSP daily.  . Probiotic Product (PROBIOTIC-10) CAPS Take 1 capsule by mouth daily.  . rosuvastatin (CRESTOR) 40 MG tablet TAKE 1/2 TO 1 TABLET BY MOUTH DAILY OR AS DIRECTED FOR CHOLESTEROL  . TURMERIC PO Take 1,400 mg by mouth daily.   No current facility-administered medications on file prior to visit.    Allergies  Allergen Reactions  . Ciprofloxacin Other (See Comments)    Alleged tendonitis   Past Medical History:  Diagnosis Date  . Anxiety   . Diverticular disease   . Hx of diverticulitis of colon 04/04/2017  . Hyperlipidemia, mixed   . Hypothyroidism 2000  . Vitamin D deficiency  Health Maintenance  Topic Date Due  . COLONOSCOPY  06/04/2003  . DEXA SCAN  06/03/2018  . MAMMOGRAM  12/19/2018  . INFLUENZA VACCINE  03/29/2019  . PNA vac Low Risk Adult (2 of 2 - PPSV23) 06/07/2019  . TETANUS/TDAP  08/28/2020  . Hepatitis C Screening  Completed   Immunization History  Administered Date(s) Administered  . Influenza Inj Mdck Quad With Preservative 07/27/2017  . Influenza, High Dose Seasonal PF 07/16/2018  . PPD Test 04/05/2017  . Pneumococcal Conjugate-13 06/06/2018  . Tdap 08/28/2010   Last Colon - circa 2015   Last MGM - in spring with Dr Kathryne Eriksson   Past Surgical  History:  Procedure Laterality Date  . BREAST BIOPSY Left    benign  . CHOLECYSTECTOMY N/A 02/08/2017   Procedure: LAPAROSCOPIC CHOLECYSTECTOMY WITH INTRAOPERATIVE CHOLANGIOGRAM;  Surgeon: Donnie Mesa, MD;  Location: [facility];  Service: General;  Laterality: N/A;  . EYE SURGERY     Eyelid lifted  . TONSILLECTOMY     Family History  Problem Relation Age of Onset  . Heart disease Mother   . Heart disease Father   . Breast cancer Sister    Social History   Tobacco Use  . Smoking status: Never Smoker  . Smokeless tobacco: Never Used  Substance Use Topics  . Alcohol use: Yes    Comment: occasional   . Drug use: No    ROS Constitutional: Denies fever, chills, weight loss/gain, headaches, insomnia,  night sweats, and change in appetite. Does c/o fatigue. Eyes: Denies redness, blurred vision, diplopia, discharge, itchy, watery eyes.  ENT: Denies discharge, congestion, post nasal drip, epistaxis, sore throat, earache, hearing loss, dental pain, Tinnitus, Vertigo, Sinus pain, snoring.  Cardio: Denies chest pain, palpitations, irregular heartbeat, syncope, dyspnea, diaphoresis, orthopnea, PND, claudication, edema Respiratory: denies cough, dyspnea, DOE, pleurisy, hoarseness, laryngitis, wheezing.  Gastrointestinal: Denies dysphagia, heartburn, reflux, water brash, pain, cramps, nausea, vomiting, bloating, diarrhea, constipation, hematemesis, melena, hematochezia, jaundice, hemorrhoids Genitourinary: Denies dysuria, frequency, urgency, nocturia, hesitancy, discharge, hematuria, flank pain Breast: Breast lumps, nipple discharge, bleeding.  Musculoskeletal: Denies arthralgia, myalgia, stiffness, Jt. Swelling, pain, limp, and strain/sprain. Denies falls. Skin: Denies puritis, rash, hives, warts, acne, eczema, changing in skin lesion Neuro: No weakness, tremor, incoordination, spasms, paresthesia, pain Psychiatric: Denies confusion, memory loss, sensory loss. Denies Depression. Endocrine:  Denies change in weight, skin, hair change, nocturia, and paresthesia, diabetic polys, visual blurring, hyper / hypo glycemic episodes.  Heme/Lymph: No excessive bleeding, bruising, enlarged lymph nodes.  Physical Exam  BP 108/62   Pulse 64   Temp (!) 97 F (36.1 C)   Resp 16   Ht 5\' 1"  (1.549 m)   Wt 109 lb 12.8 oz (49.8 kg)   BMI 20.75 kg/m   General Appearance: Well nourished, well groomed and in no apparent distress.  Eyes: PERRLA, EOMs, conjunctiva no swelling or erythema, normal fundi and vessels. Sinuses: No frontal/maxillary tenderness ENT/Mouth: EACs patent / TMs  nl. Nares clear without erythema, swelling, mucoid exudates. Oral hygiene is good. No erythema, swelling, or exudate. Tongue normal, non-obstructing. Tonsils not swollen or erythematous. Hearing normal.  Neck: Supple, thyroid not palpable. No bruits, nodes or JVD. Respiratory: Respiratory effort normal.  BS equal and clear bilateral without rales, rhonci, wheezing or stridor. Cardio: Heart sounds are normal with regular rate and rhythm and no murmurs, rubs or gallops. Peripheral pulses are normal and equal bilaterally without edema. No aortic or femoral bruits. Chest: symmetric with normal excursions and percussion. Breasts: Symmetric, without  lumps, nipple discharge, retractions, or fibrocystic changes.  Abdomen: Flat, soft with bowel sounds active. Nontender, no guarding, rebound, hernias, masses, or organomegaly.  Lymphatics: Non tender without lymphadenopathy.  Musculoskeletal: Full ROM all peripheral extremities, joint stability, 5/5 strength, and normal gait. Skin: Warm and dry without rashes, lesions, cyanosis, clubbing or  ecchymosis.  Neuro: Cranial nerves intact, reflexes equal bilaterally. Normal muscle tone, no cerebellar symptoms. Sensation intact.  Pysch: Alert and oriented X 3, normal affect, Insight and Judgment appropriate.   Assessment and Plan  1. Elevated BP without diagnosis of hypertension   - EKG 12-Lead - Urinalysis, Routine w reflex microscopic - Microalbumin / Creatinine Urine Ratio - CBC with Diff - COMPLETE METABOLIC PANEL WITH GFR - Magnesium - TSH  2. Hyperlipidemia, mixed  - EKG 12-Lead - Lipid Profile - TSH  3. Abnormal glucose  - EKG 12-Lead - Hemoglobin A1c ([facility]) - Insulin, random  4. Vitamin D deficiency  - Vitamin D (25 hydroxy)  5. Hypothyroidism,   - TSH  6. Screening for colorectal cancer  - POC Hemoccult Bld/Stl   7. Screening for ischemic heart disease  - EKG 12-Lead  8. FHx: heart disease  - EKG 12-Lead  9. Osteoporosis   10. Medication management  - Urinalysis, Routine w reflex microscopic - Microalbumin / Creatinine Urine Ratio - CBC with Diff - COMPLETE METABOLIC PANEL WITH GFR - Magnesium - Lipid Profile - TSH - Hemoglobin A1c ([facility]) - Insulin, random - Vitamin D (25 hydroxy)       Patient was counseled in prudent diet to achieve/maintain BMI less than 25 for weight control, BP monitoring, regular exercise and medications. Discussed med's effects and SE's. Screening labs and tests as requested with regular follow-up as recommended. Over 40 minutes of exam, counseling, chart review and high complex critical decision making was performed.   Kirtland Bouchard, MD

## 2019-07-03 ENCOUNTER — Encounter: Payer: Self-pay | Admitting: Internal Medicine

## 2019-07-03 ENCOUNTER — Ambulatory Visit (INDEPENDENT_AMBULATORY_CARE_PROVIDER_SITE_OTHER): Payer: Medicare Other | Admitting: Internal Medicine

## 2019-07-03 ENCOUNTER — Other Ambulatory Visit: Payer: Self-pay

## 2019-07-03 VITALS — BP 108/62 | HR 64 | Temp 97.0°F | Resp 16 | Ht 61.0 in | Wt 109.8 lb

## 2019-07-03 DIAGNOSIS — R030 Elevated blood-pressure reading, without diagnosis of hypertension: Secondary | ICD-10-CM

## 2019-07-03 DIAGNOSIS — E039 Hypothyroidism, unspecified: Secondary | ICD-10-CM | POA: Diagnosis not present

## 2019-07-03 DIAGNOSIS — R7309 Other abnormal glucose: Secondary | ICD-10-CM | POA: Diagnosis not present

## 2019-07-03 DIAGNOSIS — Z79899 Other long term (current) drug therapy: Secondary | ICD-10-CM

## 2019-07-03 DIAGNOSIS — Z136 Encounter for screening for cardiovascular disorders: Secondary | ICD-10-CM

## 2019-07-03 DIAGNOSIS — Z1211 Encounter for screening for malignant neoplasm of colon: Secondary | ICD-10-CM

## 2019-07-03 DIAGNOSIS — E782 Mixed hyperlipidemia: Secondary | ICD-10-CM

## 2019-07-03 DIAGNOSIS — Z8249 Family history of ischemic heart disease and other diseases of the circulatory system: Secondary | ICD-10-CM

## 2019-07-03 DIAGNOSIS — E559 Vitamin D deficiency, unspecified: Secondary | ICD-10-CM | POA: Diagnosis not present

## 2019-07-03 DIAGNOSIS — Z1212 Encounter for screening for malignant neoplasm of rectum: Secondary | ICD-10-CM

## 2019-07-03 DIAGNOSIS — M810 Age-related osteoporosis without current pathological fracture: Secondary | ICD-10-CM

## 2019-07-04 LAB — LIPID PANEL
Cholesterol: 178 mg/dL (ref <200)
HDL: 73 mg/dL (ref >=50)
LDL Cholesterol (Calc): 91 mg/dL (calc)
Non-HDL Cholesterol (Calc): 105 mg/dL (calc) (ref <130)
Total CHOL/HDL Ratio: 2.4 (calc) (ref <5.0)
Triglycerides: 58 mg/dL (ref <150)

## 2019-07-04 LAB — CBC WITH DIFFERENTIAL/PLATELET
Absolute Monocytes: 385 cells/uL (ref 200–950)
Basophils Absolute: 52 cells/uL (ref 0–200)
Basophils Relative: 1.1 %
Eosinophils Absolute: 28 cells/uL (ref 15–500)
Eosinophils Relative: 0.6 %
HCT: 40.2 % (ref 35.0–45.0)
Hemoglobin: 13.5 g/dL (ref 11.7–15.5)
Lymphs Abs: 1565 cells/uL (ref 850–3900)
MCH: 32.5 pg (ref 27.0–33.0)
MCHC: 33.6 g/dL (ref 32.0–36.0)
MCV: 96.9 fL (ref 80.0–100.0)
MPV: 9.9 fL (ref 7.5–12.5)
Monocytes Relative: 8.2 %
Neutro Abs: 2670 cells/uL (ref 1500–7800)
Neutrophils Relative %: 56.8 %
Platelets: 269 10*3/uL (ref 140–400)
RBC: 4.15 10*6/uL (ref 3.80–5.10)
RDW: 11.6 % (ref 11.0–15.0)
Total Lymphocyte: 33.3 %
WBC: 4.7 10*3/uL (ref 3.8–10.8)

## 2019-07-04 LAB — MAGNESIUM: Magnesium: 2 mg/dL (ref 1.5–2.5)

## 2019-07-04 LAB — COMPLETE METABOLIC PANEL WITH GFR
AG Ratio: 1.8 (calc) (ref 1.0–2.5)
ALT: 23 U/L (ref 6–29)
AST: 24 U/L (ref 10–35)
Albumin: 4.4 g/dL (ref 3.6–5.1)
Alkaline phosphatase (APISO): 44 U/L (ref 37–153)
BUN: 16 mg/dL (ref 7–25)
CO2: 30 mmol/L (ref 20–32)
Calcium: 9.7 mg/dL (ref 8.6–10.4)
Chloride: 104 mmol/L (ref 98–110)
Creat: 0.64 mg/dL (ref 0.50–0.99)
GFR, Est African American: 108 mL/min/{1.73_m2} (ref >=60)
GFR, Est Non African American: 93 mL/min/{1.73_m2} (ref >=60)
Globulin: 2.4 g/dL (calc) (ref 1.9–3.7)
Glucose, Bld: 79 mg/dL (ref 65–99)
Potassium: 5.4 mmol/L — ABNORMAL HIGH (ref 3.5–5.3)
Sodium: 141 mmol/L (ref 135–146)
Total Bilirubin: 0.6 mg/dL (ref 0.2–1.2)
Total Protein: 6.8 g/dL (ref 6.1–8.1)

## 2019-07-04 LAB — URINALYSIS, ROUTINE W REFLEX MICROSCOPIC
Bacteria, UA: NONE SEEN /HPF
Bilirubin Urine: NEGATIVE
Glucose, UA: NEGATIVE
Hgb urine dipstick: NEGATIVE
Hyaline Cast: NONE SEEN /LPF
Ketones, ur: NEGATIVE
Nitrite: NEGATIVE
Protein, ur: NEGATIVE
RBC / HPF: NONE SEEN /HPF (ref 0–2)
Specific Gravity, Urine: 1.009 (ref 1.001–1.03)
Squamous Epithelial / HPF: NONE SEEN /HPF (ref <=5)
WBC, UA: NONE SEEN /HPF (ref 0–5)
pH: 7 (ref 5.0–8.0)

## 2019-07-04 LAB — VITAMIN D 25 HYDROXY (VIT D DEFICIENCY, FRACTURES): Vit D, 25-Hydroxy: 68 ng/mL (ref 30–100)

## 2019-07-04 LAB — MICROALBUMIN / CREATININE URINE RATIO
Creatinine, Urine: 22 mg/dL (ref 20–275)
Microalb Creat Ratio: 9 mcg/mg creat (ref <30)
Microalb, Ur: 0.2 mg/dL

## 2019-07-04 LAB — HEMOGLOBIN A1C
Hgb A1c MFr Bld: 5.3 % of total Hgb (ref <5.7)
Mean Plasma Glucose: 105 (calc)
eAG (mmol/L): 5.8 (calc)

## 2019-07-04 LAB — INSULIN, RANDOM: Insulin: 7.5 u[IU]/mL

## 2019-07-04 LAB — TSH: TSH: 0.27 mIU/L — ABNORMAL LOW (ref 0.40–4.50)

## 2019-07-19 ENCOUNTER — Other Ambulatory Visit: Payer: Self-pay | Admitting: Internal Medicine

## 2019-07-19 DIAGNOSIS — E039 Hypothyroidism, unspecified: Secondary | ICD-10-CM

## 2019-07-22 ENCOUNTER — Ambulatory Visit: Payer: Self-pay | Admitting: Adult Health Nurse Practitioner

## 2019-08-05 ENCOUNTER — Other Ambulatory Visit: Payer: Self-pay

## 2019-08-05 ENCOUNTER — Ambulatory Visit (INDEPENDENT_AMBULATORY_CARE_PROVIDER_SITE_OTHER): Payer: Medicare Other | Admitting: Adult Health Nurse Practitioner

## 2019-08-05 ENCOUNTER — Encounter: Payer: Self-pay | Admitting: Adult Health Nurse Practitioner

## 2019-08-05 VITALS — BP 112/70 | HR 69 | Temp 97.3°F | Ht 61.0 in | Wt 111.0 lb

## 2019-08-05 DIAGNOSIS — R399 Unspecified symptoms and signs involving the genitourinary system: Secondary | ICD-10-CM

## 2019-08-05 DIAGNOSIS — R300 Dysuria: Secondary | ICD-10-CM

## 2019-08-05 DIAGNOSIS — R102 Pelvic and perineal pain: Secondary | ICD-10-CM

## 2019-08-05 MED ORDER — SULFAMETHOXAZOLE-TRIMETHOPRIM 800-160 MG PO TABS: 1.0000 | ORAL_TABLET | Freq: Two times a day (BID) | ORAL | 0 refills | Status: DC

## 2019-08-05 NOTE — Progress Notes (Signed)
Assessment and Plan:  Isabel Tyler was seen today for acute visit.  Diagnoses and all orders for this visit:  UTI symptoms Dysuria -     sulfamethoxazole-trimethoprim (BACTRIM DS) 800-160 MG tablet; Take 1 tablet by mouth 2 (two) times daily. Increase water intake  Pelvic pressure in female UTI? Discussed other etiologies with patient. Uterine/bladder prolapse? GYN referral is not resolved    Further disposition pending results of labs. Discussed med's effects and SE's.   Over 30 minutes of exam, counseling, chart review, and critical decision making was performed.   Future Appointments  Date Time Provider [place]  10/07/2019 10:00 AM Liane Comber, NP [facility] None  01/13/2020 10:30 AM Unk Pinto, MD [facility] None  08/10/2020 10:00 AM Unk Pinto, MD [facility] None    -------------------------------------------------------------------------------------------------   HPI 68 y.o.female presents for urinary symptoms that started on Saturday, three days ago.  She reports that she is having pelvic pressure that is intermittent with acompanying achyness. There is some relief after micturition. Denies any dysuria, hematuria,  vaginal discharge or bleeding, denies fever. Denise.any nausea or vomiting.  Denies noticing any bulge in vagina.  She is sexually active, monogamous, denies any pain with intercourse. She report she is not drinking enough water during the day when she watches her grandkids, two days a week.  Otherwise she is drinking 40-60zo daily.  Reports this pressure is uncomfortable and has been has been slowing her down.  She reports this is different than any urinary tract infection she has had in the past.    Past Medical History:  Diagnosis Date  . Anxiety   . Diverticular disease   . Hx of diverticulitis of colon 04/04/2017  . Hyperlipidemia, mixed   . Hypothyroidism 2000  . Vitamin D deficiency      Allergies  Allergen Reactions  .  Ciprofloxacin Other (See Comments)    Alleged tendonitis    Current Outpatient Medications on File Prior to Visit  Medication Sig  . alendronate (FOSAMAX) 70 MG tablet TAKE 1 TABLET BY MOUTH EVERY WEEK IN THE MORNING ON AN EMPTY STOMACH WITH ONLY A GLASS OF WATER; NOTHING BY MOUTH FOR 1 HOUR  . Ascorbic Acid (VITAMIN C) 1000 MG tablet Take 1,000 mg by mouth daily.  Marland Kitchen aspirin EC 81 MG tablet Take 81 mg by mouth daily.  . Brimonidine Tartrate 0.33 % GEL Apply 1 application topically daily as needed.  . Calcium Citrate (CITRACAL PO) Take 1 tablet by mouth daily.  . Cholecalciferol (VITAMIN D-3) 5000 units TABS Take 1 tablet by mouth daily.  Marland Kitchen escitalopram (LEXAPRO) 20 MG tablet Take 1 tablet Daily for Mood  . Flaxseed, Linseed, (FLAXSEED OIL) 1200 MG CAPS Take 1 capsule by mouth daily.  Marland Kitchen levothyroxine (SYNTHROID) 75 MCG tablet Take 1 tablet daily on an empty stomach with only water for 30 minutes & no Antacid meds, Calcium or Magnesium for 4 hours & avoid Biotin  . Omega-3 Fatty Acids (FISH OIL) 1200 MG CAPS Take 1 capsule by mouth daily.  Marland Kitchen OVER THE COUNTER MEDICATION Patient takes "Strenght of [place]" organic apple cider vinegar with added herbs and spices. 1-2 TBSP daily.  . Probiotic Product (PROBIOTIC-10) CAPS Take 1 capsule by mouth daily.  . rosuvastatin (CRESTOR) 40 MG tablet TAKE 1/2 TO 1 TABLET BY MOUTH DAILY OR AS DIRECTED FOR CHOLESTEROL  . TURMERIC PO Take 1,400 mg by mouth daily.   No current facility-administered medications on file prior to visit.     ROS: all  negative except above.   Physical Exam:  BP 112/70   Pulse 69   Temp (!) 97.3 F (36.3 C)   Ht 5\' 1"  (1.549 m)   Wt 111 lb (50.3 kg)   SpO2 99%   BMI 20.97 kg/m   General Appearance: Well nourished, in no apparent distress. Eyes: PERRLA, EOMs, conjunctiva no swelling or erythema Sinuses: No Frontal/maxillary tenderness ENT/Mouth: Wearing mask. Hearing normal.  Neck: Supple, thyroid normal.   Respiratory: Respiratory effort normal, BS equal bilaterally without rales, rhonchi, wheezing or stridor.  Cardio: RRR with no MRGs. Brisk peripheral pulses without edema.  Abdomen: Soft, + BS.  Mild tenderness to suprapubic region, no guarding, rebound, hernias, masses. Lymphatics: Non tender without lymphadenopathy.  Musculoskeletal: Full ROM, 5/5 strength, normal gait.  Skin: Warm, dry without rashes, lesions, ecchymosis.  Neuro: Cranial nerves intact. Normal muscle tone, no cerebellar symptoms. Sensation intact.  Psych: Awake and oriented X 3, normal affect, Insight and Judgment appropriate.     [name], NP 11:34 AM [facility]

## 2019-08-05 NOTE — Patient Instructions (Addendum)
   We will treat with antibiotic and sent urine for microbiology & culture.  We will contact you with the results in 1-3 days.

## 2019-08-07 ENCOUNTER — Ambulatory Visit: Payer: Medicare Other | Admitting: Adult Health Nurse Practitioner

## 2019-08-13 ENCOUNTER — Other Ambulatory Visit: Payer: Self-pay | Admitting: *Deleted

## 2019-08-13 DIAGNOSIS — Z1211 Encounter for screening for malignant neoplasm of colon: Secondary | ICD-10-CM

## 2019-08-13 LAB — POC HEMOCCULT BLD/STL (HOME/3-CARD/SCREEN)
Card #2 Fecal Occult Blod, POC: NEGATIVE
Card #3 Fecal Occult Blood, POC: NEGATIVE
Fecal Occult Blood, POC: NEGATIVE

## 2019-08-18 DIAGNOSIS — Z1211 Encounter for screening for malignant neoplasm of colon: Secondary | ICD-10-CM

## 2019-08-18 DIAGNOSIS — Z1212 Encounter for screening for malignant neoplasm of rectum: Secondary | ICD-10-CM

## 2019-09-25 ENCOUNTER — Ambulatory Visit: Payer: Medicare Other

## 2019-09-26 ENCOUNTER — Ambulatory Visit: Payer: Medicare Other

## 2019-10-04 ENCOUNTER — Ambulatory Visit: Payer: Medicare Other | Attending: Internal Medicine

## 2019-10-04 DIAGNOSIS — Z23 Encounter for immunization: Secondary | ICD-10-CM

## 2019-10-04 NOTE — Progress Notes (Signed)
   Covid-19 Vaccination Clinic  Name:  Lyzbeth Moisa    MRN: [id number] DOB: 1950/12/24  10/04/2019  Ms. Hermiz was observed post Covid-19 immunization for 15 minutes without incidence. She was provided with Vaccine Information Sheet and instruction to access the V-Safe system.   Ms. Verser was instructed to call 911 with any severe reactions post vaccine: Marland Kitchen Difficulty breathing  . Swelling of your face and throat  . A fast heartbeat  . A bad rash all over your body  . Dizziness and weakness    Immunizations Administered    Name Date Dose VIS Date Route   Pfizer COVID-19 Vaccine 10/04/2019  4:23 PM 0.3 mL 08/08/2019 Intramuscular   Manufacturer: [place]   Lot: [id number]   [place]: [id number]

## 2019-10-06 NOTE — Progress Notes (Signed)
MEDICARE ANNUAL WELLNESS VISIT AND 3 MONTH FOLLOW UP  Assessment:   [name] was seen today for medicare wellness.  Diagnoses and all orders for this visit:  Hypothyroidism, unspecified type -     TSH, will check today Will contact with results  Diverticulosis of colon without hemorrhage Doing well on current regiment Continue probiotic  Gastroesophageal reflux disease, esophagitis presence not specified Doing well on current regiment, continue wth benefit  Hyperlipidemia, mixed Continue on Crestor 40mg  At goal last visit Check lipids  Depression/anxiety Doing well on current regiment, hasn't tolerated taper Continue with benefit PQ2, negative  FHx: heart disease Control blood pressure, cholesterol, glucose, increase exercise.   Vitamin D deficiency Continue supplementation  Osteoporosis, unspecified  DEXA due this year, getting via GYN Continue Fosamax, doing well with this Teach back provided regarding proper medication administration Continue Calcium & Vit D supplements  Abnormal glucose Discussed disease progression and risks Discussed diet/exercise, weight management and risk modification  Hemangioma  Monitor  Elevated BP without diagnosis of hypertension At goal off of medications Monitor blood pressure at home; call if consistently over 130/80 Continue DASH diet.   Reminder to go to the ER if any CP, SOB, nausea, dizziness, severe HA, changes vision/speech, left arm numbness and tingling and jaw pain.    Over 40 minutes of exam, counseling, chart review and critical decision making was performed Future Appointments  Date Time Provider [place]  01/13/2020 10:30 AM Unk Pinto, MD [facility] None  08/10/2020 10:00 AM Unk Pinto, MD [facility] None     Plan:   During the course of the visit the patient was educated and counseled about appropriate screening and preventive services including:    Pneumococcal vaccine    Prevnar 13  Influenza vaccine  Td vaccine  Screening electrocardiogram  Bone densitometry screening  Colorectal cancer screening  Diabetes screening  Glaucoma screening  Nutrition counseling   Advanced directives: requested   Subjective:  Isabel Tyler is a 69 y.o. female who presents for Medicare Annual Wellness Visit and 3 month follow up.   she has a diagnosis of depression/anxiety and is currently on lexapro 20 mg daily, reports symptoms are well controlled on current regimen. She has tried to taper off several times but hasn't done well.   BMI is Body mass index is 21.54 kg/m., she has been working on diet and exercise. Wt Readings from Last 3 Encounters:  10/07/19 114 lb (51.7 kg)  08/05/19 111 lb (50.3 kg)  07/03/19 109 lb 12.8 oz (49.8 kg)    Her blood pressure has been controlled at home, today their BP is BP: 110/70 She does workout. She denies chest pain, shortness of breath, dizziness.   She is on cholesterol medication and denies myalgias. Her cholesterol is at goal. The cholesterol last visit was:   Lab Results  Component Value Date   CHOL 178 07/03/2019   HDL 73 07/03/2019   LDLCALC 91 07/03/2019   TRIG 58 07/03/2019   CHOLHDL 2.4 07/03/2019    Last A1C in the office was:  Lab Results  Component Value Date   HGBA1C 5.3 07/03/2019   Last GFR: Lab Results  Component Value Date   GFRNONAA 93 07/03/2019   She is on thyroid medication. Her medication was not changed last visit.   Lab Results  Component Value Date   TSH 0.27 (L) 07/03/2019  . Patient is on Vitamin D supplement.   Lab Results  Component Value Date   VD25OH 68 07/03/2019  Medication Review: Current Outpatient Medications on File Prior to Visit  Medication Sig Dispense Refill  . Ascorbic Acid (VITAMIN C) 1000 MG tablet Take 1,000 mg by mouth daily.    Marland Kitchen aspirin EC 81 MG tablet Take 81 mg by mouth daily.    . Brimonidine Tartrate 0.33 % GEL Apply 1 application  topically daily as needed. 30 g 1  . Calcium Citrate (CITRACAL PO) Take 1 tablet by mouth daily.    . Cholecalciferol (VITAMIN D-3) 5000 units TABS Take 1 tablet by mouth daily.    Marland Kitchen escitalopram (LEXAPRO) 20 MG tablet Take 1 tablet Daily for Mood 90 tablet 3  . Flaxseed, Linseed, (FLAXSEED OIL) 1200 MG CAPS Take 1 capsule by mouth daily.    Marland Kitchen levothyroxine (SYNTHROID) 75 MCG tablet Take 1 tablet daily on an empty stomach with only water for 30 minutes & no Antacid meds, Calcium or Magnesium for 4 hours & avoid Biotin 90 tablet 3  . Omega-3 Fatty Acids (FISH OIL) 1200 MG CAPS Take 1 capsule by mouth daily.    Marland Kitchen OVER THE COUNTER MEDICATION Patient takes "[place]reng[place]" organic apple cider vinegar with added herbs and spices. 1-2 TBSP daily.    . Probiotic Product (PROBIOTIC-10) CAPS Take 1 capsule by mouth daily.    . rosuvastatin (CRESTOR) 40 MG tablet TAKE 1/2 TO 1 TABLET BY MOUTH DAILY OR AS DIRECTED FOR CHOLESTEROL 30 tablet 0  . TURMERIC PO Take 1,400 mg by mouth daily.    Marland Kitchen alendronate (FOSAMAX) 70 MG tablet TAKE 1 TABLET BY MOUTH EVERY WEEK IN THE MORNING ON AN EMPTY STOMACH WITH ONLY A GLASS OF WATER; NOTHING BY MOUTH FOR 1 HOUR (Patient not taking: Reported on 10/07/2019) 12 tablet 3   No current facility-administered medications on file prior to visit.    Allergies  Allergen Reactions  . Ciprofloxacin Other (See Comments)    Alleged tendonitis    Current Problems (verified) Patient Active Problem List   Diagnosis Date Noted  . Rosacea 12/12/2018  . Trigger thumb, left thumb 09/26/2018  . Skin lesion of wrist 09/26/2018  . Lumbar spinal stenosis 09/03/2018  . Presbycusis of both ears 07/16/2018  . Vitamin D deficiency 06/06/2018  . Abnormal glucose 06/06/2018  . Elevated BP without diagnosis of hypertension 06/06/2018  . Gastroesophageal reflux disease 04/04/2017  . Hepatic hemangioma, Rt lobe (7 cm) Jan 2017 12/12/2016  . Hyperlipidemia, mixed 10/23/2016  .  Hypothyroidism 10/23/2016  . Major depressive disorder, recurrent episode, in partial remission ([place]) 10/23/2016  . Diverticulosis of colon without hemorrhage 10/23/2016    Screening Tests Immunization History  Administered Date(s) Administered  . Influenza Inj Mdck Quad With Preservative 07/27/2017  . Influenza, High Dose Seasonal PF 07/16/2018  . PFIZER SARS-COV-2 Vaccination 10/04/2019  . PPD Test 04/05/2017  . Pneumococcal Conjugate-13 06/06/2018  . Tdap 08/28/2010    Preventative care: [place], no report available, 10 year follow up per patient  Last mammogram: 2020, at GYN, Dr. Julien Girt - reports requested Last pap smear/pelvic exam: 2017  DEXA: 2019 at GYN, on fosamax - reports requested  CXR: 11/2016 EKG 05/2018 Hep C Screening 06/2017 Non-reactive  Prior vaccinations: TD or Tdap: 2012, Due 2022  Influenza: 2019, declines Pneumococcal: defer due to covid vaccine  Prevnar13: 05/2018 Shingles/Zostavax: Declined   Names of Other Physician/Practitioners you currently use: 1. [facility] here for primary care 2. Eye Exam, Dr. Delman Cheadle 2020 3.Dentist: Dr. Rollene Fare, 2020,  Q22months  Patient Care Team: Unk Pinto, MD as PCP - General (Internal Medicine)  SURGICAL HISTORY She  has a past surgical history that includes Breast biopsy (Left); Tonsillectomy; Eye surgery; and Cholecystectomy (N/A, 02/08/2017). FAMILY HISTORY Her family history includes Breast cancer in her sister; Heart disease in her father and mother. SOCIAL HISTORY She  reports that she has never smoked. She has never used smokeless tobacco. She reports current alcohol use. She reports that she does not use drugs.   MEDICARE WELLNESS OBJECTIVES: Physical activity: Current Exercise Habits: Home exercise routine, Type of exercise: walking, Time (Minutes): 30, Frequency (Times/Week): 4, Weekly Exercise (Minutes/Week): 120, Intensity: Mild,  Exercise limited by: None identified Cardiac risk factors: Cardiac Risk Factors include: dyslipidemia;hypertension;advanced age (>46men, >47 women) Depression/mood screen:   Depression screen [facility] 2/9 10/07/2019  Decreased Interest 0  Down, Depressed, Hopeless 0  PHQ - 2 Score 0    ADLs:  In your present state of health, do you have any difficulty performing the following activities: 10/07/2019 07/03/2019  Hearing? N N  Vision? N N  Difficulty concentrating or making decisions? N N  Walking or climbing stairs? N N  Dressing or bathing? N N  Doing errands, shopping? N N  Some recent data might be hidden     Cognitive Testing  Alert? Yes  Normal Appearance?Yes  Oriented to person? Yes  Place? Yes   Time? Yes  Recall of three objects?  Yes  Can perform simple calculations? Yes  Displays appropriate judgment?Yes  Can read the correct time from a watch face?Yes  EOL planning: Does Patient Have a Medical Advance Directive?: Yes Type of Advance Directive: Healthcare Power of Attorney, Living will Does patient want to make changes to medical advance directive?: No - Patient declined Copy of [place] in Chart?: No - copy requested  Review of Systems  Constitutional: Negative for chills, diaphoresis, fever, malaise/fatigue and weight loss.  HENT: Positive for hearing loss. Negative for congestion, ear discharge, ear pain, nosebleeds, sinus pain, sore throat and tinnitus.   Eyes: Negative for blurred vision, double vision, photophobia, pain, discharge and redness.  Respiratory: Negative for cough, hemoptysis, sputum production, shortness of breath, wheezing and stridor.   Cardiovascular: Negative for chest pain, palpitations, orthopnea, claudication, leg swelling and PND.  Gastrointestinal: Negative for abdominal pain, blood in stool, constipation, diarrhea, heartburn, melena, nausea and vomiting.  Genitourinary: Negative for dysuria, flank pain, frequency, hematuria and  urgency.  Musculoskeletal: Negative for back pain, falls, joint pain, myalgias and neck pain.  Skin: Negative for itching and rash.  Neurological: Negative for dizziness, tingling, tremors, sensory change, speech change, focal weakness, seizures, loss of consciousness, weakness and headaches.  Endo/Heme/Allergies: Negative for environmental allergies. Does not bruise/bleed easily.  Psychiatric/Behavioral: Negative for depression, hallucinations, memory loss, substance abuse and suicidal ideas. The patient is not nervous/anxious and does not have insomnia.      Objective:     Today's Vitals   10/07/19 1008  BP: 110/70  Pulse: 68  Temp: (!) 97.5 F (36.4 C)  SpO2: 96%  Weight: 114 lb (51.7 kg)   Body mass index is 21.54 kg/m.  General appearance: alert, no distress, WD/WN, female HEENT: normocephalic, sclerae anicteric, TMs pearly, nares patent, no discharge or erythema, pharynx normal Oral cavity: MMM, no lesions Neck: supple, no lymphadenopathy, no thyromegaly, no masses Heart: RRR, normal S1, S2, no murmurs Lungs: CTA bilaterally, no wheezes, rhonchi, or rales Abdomen: +bs, soft, non tender, non distended, no masses, no  hepatomegaly, no splenomegaly Musculoskeletal: nontender, no swelling, no obvious deformity Extremities: no edema, no cyanosis, no clubbing Pulses: 2+ symmetric, upper and lower extremities, normal cap refill Neurological: alert, oriented x 3, CN2-12 intact, strength normal upper extremities and lower extremities, sensation normal throughout, DTRs 2+ throughout, no cerebellar signs, gait normal Psychiatric: normal affect, behavior normal, pleasant   Medicare Attestation I have personally reviewed: The patient's medical and social history Their use of alcohol, tobacco or illicit drugs Their current medications and supplements The patient's functional ability including ADLs,fall risks, home safety risks, cognitive, and hearing and visual impairment Diet and  physical activities Evidence for depression or mood disorders  The patient's weight, height, BMI, and visual acuity have been recorded in the chart.  I have made referrals, counseling, and provided education to the patient based on review of the above and I have provided the patient with a written personalized care plan for preventive services.     Izora Ribas, NP   10/07/2019

## 2019-10-07 ENCOUNTER — Other Ambulatory Visit: Payer: Self-pay

## 2019-10-07 ENCOUNTER — Ambulatory Visit (INDEPENDENT_AMBULATORY_CARE_PROVIDER_SITE_OTHER): Payer: Medicare Other | Admitting: Adult Health

## 2019-10-07 ENCOUNTER — Encounter: Payer: Self-pay | Admitting: Adult Health

## 2019-10-07 VITALS — BP 110/70 | HR 68 | Temp 97.5°F | Wt 114.0 lb

## 2019-10-07 DIAGNOSIS — E039 Hypothyroidism, unspecified: Secondary | ICD-10-CM | POA: Diagnosis not present

## 2019-10-07 DIAGNOSIS — E782 Mixed hyperlipidemia: Secondary | ICD-10-CM | POA: Diagnosis not present

## 2019-10-07 DIAGNOSIS — H9113 Presbycusis, bilateral: Secondary | ICD-10-CM | POA: Diagnosis not present

## 2019-10-07 DIAGNOSIS — M810 Age-related osteoporosis without current pathological fracture: Secondary | ICD-10-CM | POA: Diagnosis not present

## 2019-10-07 DIAGNOSIS — K219 Gastro-esophageal reflux disease without esophagitis: Secondary | ICD-10-CM

## 2019-10-07 DIAGNOSIS — R6889 Other general symptoms and signs: Secondary | ICD-10-CM

## 2019-10-07 DIAGNOSIS — Z0001 Encounter for general adult medical examination with abnormal findings: Secondary | ICD-10-CM

## 2019-10-07 DIAGNOSIS — L719 Rosacea, unspecified: Secondary | ICD-10-CM

## 2019-10-07 DIAGNOSIS — R7309 Other abnormal glucose: Secondary | ICD-10-CM

## 2019-10-07 DIAGNOSIS — F3341 Major depressive disorder, recurrent, in partial remission: Secondary | ICD-10-CM

## 2019-10-07 DIAGNOSIS — K5730 Diverticulosis of large intestine without perforation or abscess without bleeding: Secondary | ICD-10-CM | POA: Diagnosis not present

## 2019-10-07 DIAGNOSIS — E559 Vitamin D deficiency, unspecified: Secondary | ICD-10-CM | POA: Diagnosis not present

## 2019-10-07 DIAGNOSIS — D1803 Hemangioma of intra-abdominal structures: Secondary | ICD-10-CM

## 2019-10-07 DIAGNOSIS — Z0000 Encounter for general adult medical examination without abnormal findings: Secondary | ICD-10-CM

## 2019-10-07 DIAGNOSIS — R030 Elevated blood-pressure reading, without diagnosis of hypertension: Secondary | ICD-10-CM | POA: Diagnosis not present

## 2019-10-07 LAB — COMPLETE METABOLIC PANEL WITH GFR
AG Ratio: 1.7 (calc) (ref 1.0–2.5)
ALT: 21 U/L (ref 6–29)
AST: 22 U/L (ref 10–35)
Albumin: 4.1 g/dL (ref 3.6–5.1)
Alkaline phosphatase (APISO): 44 U/L (ref 37–153)
BUN: 16 mg/dL (ref 7–25)
CO2: 30 mmol/L (ref 20–32)
Calcium: 9.2 mg/dL (ref 8.6–10.4)
Chloride: 103 mmol/L (ref 98–110)
Creat: 0.56 mg/dL (ref 0.50–0.99)
GFR, Est African American: 113 mL/min/{1.73_m2} (ref >=60)
GFR, Est Non African American: 97 mL/min/{1.73_m2} (ref >=60)
Globulin: 2.4 g/dL (calc) (ref 1.9–3.7)
Glucose, Bld: 82 mg/dL (ref 65–99)
Potassium: 4.5 mmol/L (ref 3.5–5.3)
Sodium: 138 mmol/L (ref 135–146)
Total Bilirubin: 0.5 mg/dL (ref 0.2–1.2)
Total Protein: 6.5 g/dL (ref 6.1–8.1)

## 2019-10-07 LAB — CBC WITH DIFFERENTIAL/PLATELET
Absolute Monocytes: 498 cells/uL (ref 200–950)
Basophils Absolute: 61 cells/uL (ref 0–200)
Basophils Relative: 1.3 %
Eosinophils Absolute: 61 cells/uL (ref 15–500)
Eosinophils Relative: 1.3 %
HCT: 38.5 % (ref 35.0–45.0)
Hemoglobin: 12.7 g/dL (ref 11.7–15.5)
Lymphs Abs: 1560 cells/uL (ref 850–3900)
MCH: 31.5 pg (ref 27.0–33.0)
MCHC: 33 g/dL (ref 32.0–36.0)
MCV: 95.5 fL (ref 80.0–100.0)
MPV: 9.9 fL (ref 7.5–12.5)
Monocytes Relative: 10.6 %
Neutro Abs: 2519 cells/uL (ref 1500–7800)
Neutrophils Relative %: 53.6 %
Platelets: 248 10*3/uL (ref 140–400)
RBC: 4.03 10*6/uL (ref 3.80–5.10)
RDW: 11.5 % (ref 11.0–15.0)
Total Lymphocyte: 33.2 %
WBC: 4.7 10*3/uL (ref 3.8–10.8)

## 2019-10-07 LAB — LIPID PANEL
Cholesterol: 174 mg/dL (ref <200)
HDL: 73 mg/dL (ref >=50)
LDL Cholesterol (Calc): 88 mg/dL (calc)
Non-HDL Cholesterol (Calc): 101 mg/dL (calc) (ref <130)
Total CHOL/HDL Ratio: 2.4 (calc) (ref <5.0)
Triglycerides: 52 mg/dL (ref <150)

## 2019-10-07 LAB — TSH: TSH: 0.41 mIU/L (ref 0.40–4.50)

## 2019-10-07 NOTE — Patient Instructions (Addendum)
  [name] , Thank you for taking time to come for your Medicare Wellness Visit. I appreciate your ongoing commitment to your health goals. Please review the following plan we discussed and let me know if I can assist you in the future.   These are the goals we discussed: Goals    . Exercise 150 min/wk Moderate Activity       This is a list of the screening recommended for you and due dates:  Health Maintenance  Topic Date Due  . DEXA scan (bone density measurement)  06/03/2018  . Mammogram  12/19/2018  . Pneumonia vaccines (2 of 2 - PPSV23) 11/17/2019*  . Flu Shot  11/26/2019*  . Tetanus Vaccine  08/28/2020  . Colon Cancer Screening  08/28/2024  .  Hepatitis C: One time screening is recommended by Center for Disease Control  (CDC) for  adults born from [birth year] through 1963.   Completed  *Topic was postponed. The date shown is not the original due date.

## 2019-10-30 ENCOUNTER — Ambulatory Visit: Payer: Medicare Other | Attending: Internal Medicine

## 2019-10-30 DIAGNOSIS — Z23 Encounter for immunization: Secondary | ICD-10-CM | POA: Insufficient documentation

## 2019-10-30 NOTE — Progress Notes (Signed)
   Covid-19 Vaccination Clinic  Name:  Isabel Tyler    MRN: [id number] DOB: 1950/09/02  10/30/2019  Isabel Tyler was observed post Covid-19 immunization for 15 minutes without incident. She was provided with Vaccine Information Sheet and instruction to access the V-Safe system.   Isabel Tyler was instructed to call 911 with any severe reactions post vaccine: Marland Kitchen Difficulty breathing  . Swelling of face and throat  . A fast heartbeat  . A bad rash all over body  . Dizziness and weakness   Immunizations Administered    Name Date Dose VIS Date Route   Pfizer COVID-19 Vaccine 10/30/2019 12:22 PM 0.3 mL 08/08/2019 Intramuscular   Manufacturer: [place]   Lot: [id number]   [place]: [id number]

## 2019-11-06 DIAGNOSIS — H5203 Hypermetropia, bilateral: Secondary | ICD-10-CM | POA: Diagnosis not present

## 2019-11-06 DIAGNOSIS — H353131 Nonexudative age-related macular degeneration, bilateral, early dry stage: Secondary | ICD-10-CM | POA: Diagnosis not present

## 2019-11-10 ENCOUNTER — Telehealth: Payer: Self-pay

## 2019-11-10 ENCOUNTER — Other Ambulatory Visit: Payer: Self-pay | Admitting: Internal Medicine

## 2019-11-10 MED ORDER — ESCITALOPRAM OXALATE 20 MG PO TABS: ORAL_TABLET | ORAL | 0 refills | Status: DC

## 2019-11-10 NOTE — Telephone Encounter (Signed)
Patient states that symptoms of sinus pressure, mucus is yellow/greenish from her nose. Feeling extremely fatiqued, "wiped out". Has taken OTC sinus and allergy meds without any relief.

## 2019-11-10 NOTE — Telephone Encounter (Signed)
Patient states that she had her covid vaccine a week ago and not feeling well. Requesting a prescription. Please advise.

## 2019-11-10 NOTE — Telephone Encounter (Signed)
Patient called back and forgot to mention that she is having heaviness in her chest as well.  I advised patient she would need to be seen. Patient has scheduled an appointment.

## 2019-11-11 ENCOUNTER — Other Ambulatory Visit: Payer: Self-pay | Admitting: Physician Assistant

## 2019-11-11 ENCOUNTER — Encounter: Payer: Self-pay | Admitting: Physician Assistant

## 2019-11-11 ENCOUNTER — Ambulatory Visit (INDEPENDENT_AMBULATORY_CARE_PROVIDER_SITE_OTHER): Payer: Medicare Other | Admitting: Physician Assistant

## 2019-11-11 ENCOUNTER — Other Ambulatory Visit: Payer: Self-pay

## 2019-11-11 VITALS — BP 122/76 | HR 80 | Temp 97.5°F | Wt 113.0 lb

## 2019-11-11 DIAGNOSIS — J320 Chronic maxillary sinusitis: Secondary | ICD-10-CM

## 2019-11-11 DIAGNOSIS — R0789 Other chest pain: Secondary | ICD-10-CM

## 2019-11-11 DIAGNOSIS — J449 Chronic obstructive pulmonary disease, unspecified: Secondary | ICD-10-CM | POA: Insufficient documentation

## 2019-11-11 DIAGNOSIS — K219 Gastro-esophageal reflux disease without esophagitis: Secondary | ICD-10-CM

## 2019-11-11 MED ORDER — FLUCONAZOLE 150 MG PO TABS: 150.0000 mg | ORAL_TABLET | Freq: Every day | ORAL | 3 refills | Status: DC

## 2019-11-11 MED ORDER — OMEPRAZOLE 20 MG PO CPDR: 20.0000 mg | DELAYED_RELEASE_CAPSULE | Freq: Every day | ORAL | 1 refills | Status: DC

## 2019-11-11 MED ORDER — AZITHROMYCIN 250 MG PO TABS: ORAL_TABLET | ORAL | 1 refills | Status: AC

## 2019-11-11 NOTE — Progress Notes (Signed)
Subjective:    Patient ID: Isabel Tyler, female    DOB: Nov 25, 1950, 69 y.o.   MRN: [id number]  HPI 69 y.o. WF with history of chol, depression, GERD, preDM, HTN presents with sinus symptoms and chest discomfort.   States 03/04 has last COVID vaccine, she woke up Saturday with teeth pain and sinus pressure but that went away. She then states last Wednesday she started to have sinus congestion, sinus pressure and very fatigued with cough, yellow/clear.  Watches her grandkids 2 days a week, they had a cold 2 weeks ago.  Has tried allergy and cold medication that did not help.  Has started local honey.  She states she has felt gas build up, feels that she can not breath, will have chest heaviness and unable to get her breath but relieved with burping.  This AM had coffee, bagel with peanut butter on it. She will have pressure, trouble having a deep breath and will feel much better with a burp, very strong series of burps.  Had EGD 6-8 years ago, out of town. Had neg H pylori.  No diarrhea or constipation  Last EKG was 06/2019, today it showed ectopic beat, flattened p waves, have been inverted in the past, no ST changes.  Has never had stress test/cardio work up Last CXR 2018 showed COPD.  Has had both COVID vaccines. Last one 10/30/19.   Blood pressure 122/76, pulse 80, temperature (!) 97.5 F (36.4 C), weight 113 lb (51.3 kg), SpO2 98 %.  Medications  Current Outpatient Medications (Endocrine & Metabolic):  .  alendronate (FOSAMAX) 70 MG tablet, TAKE 1 TABLET BY MOUTH EVERY WEEK IN THE MORNING ON AN EMPTY STOMACH WITH ONLY A GLASS OF WATER; NOTHING BY MOUTH FOR 1 HOUR .  levothyroxine (SYNTHROID) 75 MCG tablet, Take 1 tablet daily on an empty stomach with only water for 30 minutes & no Antacid meds, Calcium or Magnesium for 4 hours & avoid Biotin  Current Outpatient Medications (Cardiovascular):  .  rosuvastatin (CRESTOR) 40 MG tablet, TAKE 1/2 TO 1 TABLET BY MOUTH DAILY OR AS  DIRECTED FOR CHOLESTEROL   Current Outpatient Medications (Analgesics):  .  aspirin EC 81 MG tablet, Take 81 mg by mouth daily.   Current Outpatient Medications (Other):  Marland Kitchen  Ascorbic Acid (VITAMIN C) 1000 MG tablet, Take 1,000 mg by mouth daily. .  Brimonidine Tartrate 0.33 % GEL, Apply 1 application topically daily as needed. .  Calcium Citrate (CITRACAL PO), Take 1 tablet by mouth daily. .  Cholecalciferol (VITAMIN D-3) 5000 units TABS, Take 1 tablet by mouth daily. Marland Kitchen  escitalopram (LEXAPRO) 20 MG tablet, Take 1 tablet Daily for Mood .  Flaxseed, Linseed, (FLAXSEED OIL) 1200 MG CAPS, Take 1 capsule by mouth daily. .  Omega-3 Fatty Acids (FISH OIL) 1200 MG CAPS, Take 1 capsule by mouth daily. Marland Kitchen  OVER THE COUNTER MEDICATION, Patient takes "Streng[place] of [place]" organic apple cider vinegar with added herbs and spices. 1-2 TBSP daily. .  Probiotic Product (PROBIOTIC-10) CAPS, Take 1 capsule by mouth daily. .  TURMERIC PO, Take 1,400 mg by mouth daily.  Problem list She has Hyperlipidemia, mixed; Hypothyroidism; Major depressive disorder, recurrent episode, in partial remission ([place]); Diverticulosis of colon without hemorrhage; Hepatic hemangioma, Rt lobe (7 cm) Jan 2017; Gastroesophageal reflux disease; Vitamin D deficiency; Abnormal glucose; Elevated BP without diagnosis of hypertension; Lumbar spinal stenosis; Trigger thumb, left thumb; Skin lesion of wrist; Rosacea; Osteoporosis; and COPD (chronic obstructive pulmonary disease) ([place]) on their  problem list.   Review of Systems  Constitutional: Positive for fatigue. Negative for chills, diaphoresis and fever.  HENT: Positive for congestion, postnasal drip and sinus pressure. Negative for ear pain, sneezing and sore throat.   Respiratory: Positive for shortness of breath. Negative for cough, chest tightness and wheezing.   Cardiovascular: Positive for chest pain. Negative for palpitations and leg swelling.  Gastrointestinal: Negative.   Negative for abdominal pain, constipation, diarrhea, nausea and vomiting.  Genitourinary: Negative.   Musculoskeletal: Negative for neck pain.  Neurological: Positive for headaches.       Objective:   Physical Exam Constitutional:      Appearance: She is well-developed.  HENT:     Head: Normocephalic and atraumatic.     Right Ear: External ear normal.     Nose:     Right Sinus: Maxillary sinus tenderness present. No frontal sinus tenderness.     Left Sinus: Maxillary sinus tenderness present. No frontal sinus tenderness.  Eyes:     Conjunctiva/sclera: Conjunctivae normal.  Cardiovascular:     Rate and Rhythm: Normal rate and regular rhythm.     Heart sounds: Normal heart sounds.  Pulmonary:     Effort: Pulmonary effort is normal. No respiratory distress.     Breath sounds: Normal breath sounds. No wheezing.  Abdominal:     General: Bowel sounds are normal.     Palpations: Abdomen is soft.  Musculoskeletal:     Cervical back: Normal range of motion and neck supple.  Lymphadenopathy:     Cervical: Cervical adenopathy present.  Skin:    General: Skin is warm and dry.           Assessment & Plan:  Isabel Tyler was seen today for sinus problem and other.  Diagnoses and all orders for this visit:  Maxillary sinusitis, unspecified chronicity -     azithromycin (ZITHROMAX) 250 MG tablet; Take 2 tablets (500 mg) on  Day 1,  followed by 1 tablet (250 mg) once daily on Days 2 through 5. -     fluconazole (DIFLUCAN) 150 MG tablet; Take 1 tablet (150 mg total) by mouth daily. Wait another 3-4 days, continue flonase, allergy pill and if not better   Chronic obstructive pulmonary disease, unspecified COPD type ([place]) -     DG Chest 2 View; Future - never a smoker, ? Over read, will repeat and if still showing COPD/hyperinflation will get PFTs  Laryngopharyngeal reflux (LPR) -     omeprazole (PRILOSEC) 20 MG capsule; Take 1 capsule (20 mg total) by mouth daily.  Chest pressure -      EKG 12-Lead- WNL no ST changes - no symptoms with exertion, better with beltching - will do trial of prilosec to see if this helps.  Go to the ER if any chest pain, shortness of breath, nausea, dizziness, severe HA, changes vision/speech  The patient was advised to call immediately if she has any concerning symptoms in the interval. The patient voices understanding of current treatment options and is in agreement with the current care plan.The patient knows to call the clinic with any problems, questions or concerns or go to the ER if any further progression of symptoms.  w Future Appointments  Date Time Provider [place]  01/13/2020 10:30 AM Unk Pinto, MD [facility] None  08/10/2020 10:00 AM Unk Pinto, MD [facility] None  11/10/2020 10:00 AM Liane Comber, NP [facility] None

## 2019-11-11 NOTE — Patient Instructions (Addendum)
Can do a steroid nasal spary 1-2 sparys at night each nostril.  Examples are nasonex, flonase, nasocort- they are over the counter.  Remember to spray each nostril twice towards the outer part of your eye.   Do not sniff but instead pinch your nose and tilt your head back to help the medicine get into your sinuses.   The best time to do this is at bedtime.  Stop if you get blurred vision or nose bleeds.   THIS WILL TAKE 7 DAYS TO WORK AND IS BETTER IF YOU START BEFORE SYMPTOMS SO IF YOU HAVE A SEASON OR TIME OF THE YEAR YOU ALWAYS GET A COLD, START BEFORE THAT!  INFORMATION ABOUT YOUR XRAY [facility] Can walk into [address] building for an Insurance account manager. They will have the order and take you back. You do not any paper work, I should get the result back today or tomorrow. This order is good for a year.  Can call [phone] to schedule an appointment if you wish.    HOW TO TREAT VIRAL COUGH AND COLD SYMPTOMS:  -Symptoms usually last at least 1 week with the worst symptoms being around day 4.  - colds usually start with a sore throat and end with a cough, and the cough can take 2 weeks to get better.  -No antibiotics are needed for colds, flu, sore throats, cough, bronchitis UNLESS symptoms are longer than 7 days OR if you are getting better then get drastically worse.  -There are a lot of combination medications (Dayquil, Nyquil, Vicks 44, tyelnol cold and sinus, ETC). Please look at the ingredients on the back so that you are treating the correct symptoms and not doubling up on medications/ingredients.    Medicines you can use  Nasal congestion  Little Remedies saline spray (aerosol/mist)- can try this, it is in the kids section - pseudoephedrine (Sudafed)- behind the counter, do not use if you have high blood pressure, medicine that have -D in them.  - phenylephrine (Sudafed PE) -Dextormethorphan + chlorpheniramine (Coridcidin HBP)- okay if you have high blood  pressure -Oxymetazoline (Afrin) nasal spray- LIMIT to 3 days -Saline nasal spray -Neti pot (used distilled or bottled water)  Ear pain/congestion  -pseudoephedrine (sudafed) - Nasonex/flonase nasal spray  Fever  -Acetaminophen (Tyelnol) -Ibuprofen (Advil, motrin, aleve)  Sore Throat  -Acetaminophen (Tyelnol) -Ibuprofen (Advil, motrin, aleve) -Drink a lot of water -Gargle with salt water - Rest your voice (don't talk) -Throat sprays -Cough drops  Body Aches  -Acetaminophen (Tyelnol) -Ibuprofen (Advil, motrin, aleve)  Headache  -Acetaminophen (Tyelnol) -Ibuprofen (Advil, motrin, aleve) - Exedrin, Exedrin Migraine  Allergy symptoms (cough, sneeze, runny nose, itchy eyes) -Claritin or loratadine cheapest but likely the weakest  -Zyrtec or certizine at night because it can make you sleepy -The strongest is allegra or fexafinadine  Cheapest at [employer], [employer], [employer]  Cough  -Dextromethorphan (Delsym)- medicine that has DM in it -Guafenesin (Mucinex/Robitussin) - cough drops - drink lots of water  Chest Congestion  -Guafenesin (Mucinex/Robitussin)  Red Itchy Eyes  - Naphcon-A  Upset Stomach  - Bland diet (nothing spicy, greasy, fried, and high acid foods like tomatoes, oranges, berries) -OKAY- cereal, bread, soup, crackers, rice -Eat smaller more frequent meals -reduce caffeine, no alcohol -Loperamide (Imodium-AD) if diarrhea -Prevacid for heart burn  General health when sick  -Hydration -wash your hands frequently -keep surfaces clean -change pillow cases and sheets often -Get fresh air but do not exercise strenuously -Vitamin D, double up on it -  Vitamin C -Zinc      Silent reflux: Not all heartburn burns...Marland KitchenMarland KitchenMarland Kitchen  What is LPR? Laryngopharyngeal reflux (LPR) or silent reflux is a condition in which acid that is made in the stomach travels up the esophagus (swallowing tube) and gets to the throat. Not everyone with reflux has a lot of heartburn or  indigestion. In fact, many people with LPR never have heartburn. This is why LPR is called SILENT REFLUX, and the terms "Silent reflux" and "LPR" are often used interchangeably. Because LPR is silent, it is sometimes difficult to diagnose.  How can you tell if you have LPR?  Marland Kitchen Chronic hoarseness- Some people have hoarseness that comes and goes . throat clearing  . Cough . It can cause shortness of breath and cause asthma like symptoms. Marland Kitchen a feeling of a lump in the throat  . difficulty swallowing . a problem with too much nose and throat drainage.  . Some people will feel their esophagus spasm which feels like their heart beating hard and fast, this will usually be after a meal, at rest, or lying down at night.    How do I treat this? Treatment for LPR should be individualized, and your doctor will suggest the best treatment for you. Generally there are several treatments for LPR: . changing habits and diet to reduce reflux,  . medications to reduce stomach acid, and  . surgery to prevent reflux. Most people with LPR need to modify how and when they eat, as well as take some medication, to get well. Sometimes, nonprescription liquid antacids, such as Maalox, Gelucil and Mylanta are recommended. When used, these antacids should be taken four times each day - one tablespoon one hour after each meal and before bedtime. Dietary and lifestyle changes alone are not often enough to control LPR - medications that reduce stomach acid are also usually needed. These must be prescribed by our doctor.  Make sure you are taking your vitamin C with food  TIPS FOR REDUCING REFLUX AND LPR Control your LIFE-STYLE and your DIET! Marland Kitchen If you use tobacco, QUIT.  Marland Kitchen Smoking makes you reflux. After every cigarette you have some LPR.  . Don't wear clothing that is too tight, especially around the waist (trousers, corsets, belts).  . Do not lie down just after eating...in fact, do not eat within three hours of  bedtime.  . You should be on a low-fat diet.  . Limit your intake of red meat.  . Limit your intake of butter.  Marland Kitchen Avoid fried foods.  . Avoid chocolate  . Avoid cheese.  Marland Kitchen Avoid eggs. Marland Kitchen Specifically avoid caffeine (especially coffee and tea), soda pop (especially cola) and mints.  . Avoid alcoholic beverages, particularly in the evening.

## 2019-11-18 ENCOUNTER — Encounter: Payer: Self-pay | Admitting: *Deleted

## 2019-12-08 NOTE — Progress Notes (Deleted)
Subjective:    Patient ID: Isabel Tyler, female    DOB: [birth year], 69 y.o.   MRN: [id number]  HPI 69 y.o. WF with history of chol, depression, GERD, preDM, HTN presents for follow up of AB symptoms.   She states she has felt gas build up, feels that she can not breath, will have chest heaviness and unable to get her breath but relieved with burping.  This AM had coffee, bagel with peanut butter on it. She will have pressure, trouble having a deep breath and will feel much better with a burp, very strong series of burps.  Had EGD 6-8 years ago, out of town. Had neg H pylori.  No diarrhea or constipation  Last EKG was 06/2019, today it showed ectopic beat, flattened p waves, have been inverted in the past, no ST changes.  Has never had stress test/cardio work up Last CXR 2018 showed COPD.  Has had both COVID vaccines. Last one 10/30/19.   There were no vitals taken for this visit.  Medications  Current Outpatient Medications (Endocrine & Metabolic):  .  alendronate (FOSAMAX) 70 MG tablet, TAKE 1 TABLET BY MOUTH EVERY WEEK IN THE MORNING ON AN EMPTY STOMACH WITH ONLY A GLASS OF WATER; NOTHING BY MOUTH FOR 1 HOUR .  levothyroxine (SYNTHROID) 75 MCG tablet, Take 1 tablet daily on an empty stomach with only water for 30 minutes & no Antacid meds, Calcium or Magnesium for 4 hours & avoid Biotin  Current Outpatient Medications (Cardiovascular):  .  rosuvastatin (CRESTOR) 40 MG tablet, TAKE 1/2 TO 1 TABLET BY MOUTH DAILY OR AS DIRECTED FOR CHOLESTEROL   Current Outpatient Medications (Analgesics):  .  aspirin EC 81 MG tablet, Take 81 mg by mouth daily.   Current Outpatient Medications (Other):  Marland Kitchen  Ascorbic Acid (VITAMIN C) 1000 MG tablet, Take 1,000 mg by mouth daily. .  Brimonidine Tartrate 0.33 % GEL, Apply 1 application topically daily as needed. .  Calcium Citrate (CITRACAL PO), Take 1 tablet by mouth daily. .  Cholecalciferol (VITAMIN D-3) 5000 units TABS, Take 1 tablet by  mouth daily. Marland Kitchen  escitalopram (LEXAPRO) 20 MG tablet, Take 1 tablet Daily for Mood .  Flaxseed, Linseed, (FLAXSEED OIL) 1200 MG CAPS, Take 1 capsule by mouth daily. .  fluconazole (DIFLUCAN) 150 MG tablet, Take 1 tablet (150 mg total) by mouth daily. .  Omega-3 Fatty Acids (FISH OIL) 1200 MG CAPS, Take 1 capsule by mouth daily. Marland Kitchen  omeprazole (PRILOSEC) 20 MG capsule, Take 1 capsule Daily for Indigestion & Heartburn .  OVER THE COUNTER MEDICATION, Patient takes "Strenght of [place] Hill" organic apple cider vinegar with added herbs and spices. 1-2 TBSP daily. .  Probiotic Product (PROBIOTIC-10) CAPS, Take 1 capsule by mouth daily. .  TURMERIC PO, Take 1,400 mg by mouth daily.  Problem list She has Hyperlipidemia, mixed; Hypothyroidism; Major depressive disorder, recurrent episode, in partial remission ([place]); Diverticulosis of colon without hemorrhage; Hepatic hemangioma, Rt lobe (7 cm) Jan 2017; Gastroesophageal reflux disease; Vitamin D deficiency; Abnormal glucose; Elevated BP without diagnosis of hypertension; Lumbar spinal stenosis; Trigger thumb, left thumb; Skin lesion of wrist; Rosacea; Osteoporosis; and COPD (chronic obstructive pulmonary disease) ([place]) on their problem list.   Review of Systems  Constitutional: Negative.   HENT: Negative.   Respiratory: Negative.   Cardiovascular: Negative.   Gastrointestinal: Negative.   Genitourinary: Negative.   Musculoskeletal: Negative.   Skin: Negative.   Neurological: Negative.   Hematological: Negative.   Psychiatric/Behavioral: Negative.  Objective:   Physical Exam Constitutional:      Appearance: She is well-developed.  HENT:     Head: Normocephalic and atraumatic.     Right Ear: External ear normal.     Left Ear: External ear normal.  Eyes:     Conjunctiva/sclera: Conjunctivae normal.     Pupils: Pupils are equal, round, and reactive to light.  Neck:     Thyroid: No thyromegaly.  Cardiovascular:     Rate and Rhythm:  Normal rate and regular rhythm.     Heart sounds: Normal heart sounds. No murmur. No friction rub. No gallop.   Pulmonary:     Effort: Pulmonary effort is normal. No respiratory distress.     Breath sounds: Normal breath sounds. No wheezing.  Abdominal:     General: Bowel sounds are normal. There is no distension.     Palpations: Abdomen is soft. There is no mass.     Tenderness: There is no abdominal tenderness. There is no guarding or rebound.  Musculoskeletal:        General: Normal range of motion.     Cervical back: Normal range of motion and neck supple.  Lymphadenopathy:     Cervical: No cervical adenopathy.  Skin:    General: Skin is warm and dry.  Neurological:     Mental Status: She is alert and oriented to person, place, and time.     Cranial Nerves: No cranial nerve deficit.     Coordination: Coordination normal.     Deep Tendon Reflexes: Reflexes normal.     Assessment & Plan:    The patient was advised to call immediately if she has any concerning symptoms in the interval. The patient voices understanding of current treatment options and is in agreement with the current care plan.The patient knows to call the clinic with any problems, questions or concerns or go to the ER if any further progression of symptoms.  w Future Appointments  Date Time Provider [place]  12/09/2019 11:30 AM Vicie Mutters, PA-C [facility] None  01/13/2020 10:30 AM Unk Pinto, MD [facility] None  08/10/2020 10:00 AM Unk Pinto, MD [facility] None  11/10/2020 10:00 AM Liane Comber, NP [facility] None

## 2019-12-09 ENCOUNTER — Ambulatory Visit: Payer: Medicare Other | Admitting: Physician Assistant

## 2019-12-25 DIAGNOSIS — L719 Rosacea, unspecified: Secondary | ICD-10-CM | POA: Diagnosis not present

## 2019-12-25 DIAGNOSIS — Z411 Encounter for cosmetic surgery: Secondary | ICD-10-CM | POA: Diagnosis not present

## 2019-12-25 DIAGNOSIS — L821 Other seborrheic keratosis: Secondary | ICD-10-CM | POA: Diagnosis not present

## 2020-01-13 ENCOUNTER — Ambulatory Visit: Payer: Medicare Other | Admitting: Internal Medicine

## 2020-01-14 NOTE — Progress Notes (Signed)
FOLLOW UP  Assessment and Plan:   Hypothyroidism continue medications the same pending lab results reminded to take on an empty stomach 30-24mins before food.  check TSH level  Cholesterol At goal on rosuvastatin; tirtrate as needed for LDL goal <100 Continue low cholesterol diet and exercise.  Check lipid panel.   Hypothyroidism continue medications the same pending lab results reminded to take on an empty stomach 30-55mins before food.  check TSH level  BMI 21 Continue to recommend diet heavy in fruits and veggies and low in animal meats, cheeses, and dairy products, appropriate calorie intake Discuss exercise recommendations routinely Continue to monitor weight at each visit  Vitamin D Def At goal at last visit; continue supplementation to maintain goal of 60-100 Defer Vit D level  Depression/anxiety Continue medications  Lifestyle discussed: diet/exerise, sleep hygiene, stress management, hydration  Insomnia  - improved with lifestyle modification and magnesium/melatonin PRN - as per below will try amitriptyline   Acute sinusitis Suggested symptomatic OTC remedies. Nasal saline spray for congestion. Nasal steroids, allergy pill, oral steroids offered (declines) Follow up as needed. -     amoxicillin (AMOXIL) 500 MG tablet; Take 1 tablet (500 mg total) by mouth 3 (three) times daily.  Upper abdominal bloating/belching/fullness Ongoing for many years, per patient not improved s/p cholecystectomy, had normal EGD/h pylori, no hiatal hernia. Has done several trials of PPI without improvement ? Functional dyspepsia, still try addition of TCA Will have her taper lexapro, initiate amitriptyline 25 mg at night for 2 weeks then increase to whole tab if tolerating, possible benefit with functional dyspepsia, sleep, mood Follow up in 12 weeks -     amitriptyline (ELAVIL) 50 MG tablet; Take 1 tablet (50 mg total) by mouth at bedtime. For sleep, mood and functional  dyspepsia.   Continue diet and meds as discussed. Further disposition pending results of labs. Discussed med's effects and SE's.   Over 30 minutes of exam, counseling, chart review, and critical decision making was performed.   Future Appointments  Date Time Provider [place]  04/16/2020 10:00 AM Vicie Mutters, PA-C [facility] None  08/10/2020 10:00 AM Unk Pinto, MD [facility] None  11/10/2020 10:00 AM Liane Comber, NP [facility] None    ----------------------------------------------------------------------------------------------------------------------  HPI 69 y.o. female  presents for 3 month follow up on cholesterol, hypothyroid, depression and vitamin D deficiency.  She presents with URI sx; she reports 4 days ago on Sunday felt fatigued, Monday Tuesday became worse, pressure in sinuses, nasal congestion, post-nasal drip, thick yellow secretions, aching in upper teeth, coughing (attributes to sinus drainage). Denies fever, feels cold but typical for her, endorses sinus headache, denies dizziness or vision changes. Reports fatigue/malaise.   Does have seasonal allergies, has been taking loratadine, typically takes PRN taking intermittently since March, hasn't taken regularly this week. Has added OTC sinus and cold tabs without much benefit, has added fluticasone sprays daily since onset without much improvement.   She did have both covid 19 vaccines, in March.    She is also concerned about many years of GI sx; she describes sense of upper abdominal bloating and fullness, pressure, typically starts having towards end of the meal, severe belching, will have pain in her stomach. Worse in the evening with dinner time (she questions due to large meal), but has with all meals regardless of what she eats. No recent record to review, but patient report has had EGD, H. Pylori in the past with same symptoms and reports was negative 6-8 years ago  in [place] (no report for review, she  will attempt to obtain). She has been initiated on PPI on several occasions, most recently started taking omeprazole 20 mg dialy without notable benefit. Denies reflux, hoarseness, burning, cough. She reports had [place] which showed gallstones, had cholecystectomy without improvement in sx.   she has a diagnosis of anxiety and is currently on lexapro 20 mg daily, reports symptoms are well controlled on current regimen. She has tried to taper off several times but hasn't done well. Takes magnesium and melatonin for insomnia with intermittent benefit.   BMI is Body mass index is 21.16 kg/m., she has been working on diet and exercise. Wt Readings from Last 3 Encounters:  01/15/20 112 lb (50.8 kg)  11/11/19 113 lb (51.3 kg)  10/07/19 114 lb (51.7 kg)   She does not check BP at home, today their BP is BP: 110/68  She does workout. She denies chest pain, shortness of breath, dizziness.   She is on cholesterol medication (rosuvastatin 20 mg three days weekly) and denies myalgias. Her cholesterol is at goal. The cholesterol last visit was:   Lab Results  Component Value Date   CHOL 174 10/07/2019   HDL 73 10/07/2019   LDLCALC 88 10/07/2019   TRIG 52 10/07/2019   CHOLHDL 2.4 10/07/2019    She has been working on diet and exercise for glucose management, and denies foot ulcerations, increased appetite, nausea, paresthesia of the feet, polydipsia, polyuria, visual disturbances, vomiting and weight loss. Last A1C in the office was:  Lab Results  Component Value Date   HGBA1C 5.3 07/03/2019   She is on thyroid medication. Her medication was not changed last visit.   Lab Results  Component Value Date   TSH 0.41 10/07/2019   Patient is on Vitamin D supplement and at goal:   Lab Results  Component Value Date   VD25OH 68 07/03/2019        Current Medications:  Current Outpatient Medications on File Prior to Visit  Medication Sig  . alendronate (FOSAMAX) 70 MG tablet TAKE 1 TABLET BY MOUTH  EVERY WEEK IN THE MORNING ON AN EMPTY STOMACH WITH ONLY A GLASS OF WATER; NOTHING BY MOUTH FOR 1 HOUR  . Ascorbic Acid (VITAMIN C) 1000 MG tablet Take 1,000 mg by mouth daily.  Marland Kitchen aspirin EC 81 MG tablet Take 81 mg by mouth daily.  . Brimonidine Tartrate 0.33 % GEL Apply 1 application topically daily as needed.  . Calcium Citrate (CITRACAL PO) Take 1 tablet by mouth daily.  . Cholecalciferol (VITAMIN D-3) 5000 units TABS Take 1 tablet by mouth daily.  . Flaxseed, Linseed, (FLAXSEED OIL) 1200 MG CAPS Take 1 capsule by mouth daily.  Marland Kitchen levothyroxine (SYNTHROID) 75 MCG tablet Take 1 tablet daily on an empty stomach with only water for 30 minutes & no Antacid meds, Calcium or Magnesium for 4 hours & avoid Biotin  . Omega-3 Fatty Acids (FISH OIL) 1200 MG CAPS Take 1 capsule by mouth daily.  Marland Kitchen omeprazole (PRILOSEC) 20 MG capsule Take 1 capsule Daily for Indigestion & Heartburn  . Probiotic Product (PROBIOTIC-10) CAPS Take 1 capsule by mouth daily.  . rosuvastatin (CRESTOR) 40 MG tablet TAKE 1/2 TO 1 TABLET BY MOUTH DAILY OR AS DIRECTED FOR CHOLESTEROL  . TURMERIC PO Take 1,400 mg by mouth daily.  Marland Kitchen OVER THE COUNTER MEDICATION Patient takes "Strenght of [place]" organic apple cider vinegar with added herbs and spices. 1-2 TBSP daily.   No current facility-administered medications  on file prior to visit.     Allergies:  Allergies  Allergen Reactions  . Ciprofloxacin Other (See Comments)    Alleged tendonitis     Medical History:  Past Medical History:  Diagnosis Date  . Anxiety   . Diverticular disease   . Hx of diverticulitis of colon 04/04/2017  . Hyperlipidemia, mixed   . Hypothyroidism 2000  . Vitamin D deficiency    Family history- Reviewed and unchanged Social history- Reviewed and unchanged   Review of Systems:  Review of Systems  Constitutional: Negative for malaise/fatigue and weight loss.  HENT: Negative for hearing loss and tinnitus.   Eyes: Negative for blurred vision and  double vision.  Respiratory: Negative for cough, shortness of breath and wheezing.   Cardiovascular: Negative for chest pain, palpitations, orthopnea, claudication and leg swelling.  Gastrointestinal: Positive for abdominal pain (bloating, belching, distended after meals). Negative for blood in stool, constipation, diarrhea, heartburn, melena, nausea and vomiting.  Genitourinary: Negative.   Musculoskeletal: Negative for back pain, joint pain and myalgias.  Skin: Negative for rash.  Neurological: Negative for dizziness, tingling, sensory change, weakness and headaches.  Endo/Heme/Allergies: Negative for polydipsia.  Psychiatric/Behavioral: Negative for depression, hallucinations and substance abuse. The patient has insomnia. The patient is not nervous/anxious.   All other systems reviewed and are negative.   Physical Exam: BP 110/68   Pulse 90   Temp 97.9 F (36.6 C)   Ht 5\' 1"  (1.549 m)   Wt 112 lb (50.8 kg)   SpO2 99%   BMI 21.16 kg/m  Wt Readings from Last 3 Encounters:  01/15/20 112 lb (50.8 kg)  11/11/19 113 lb (51.3 kg)  10/07/19 114 lb (51.7 kg)   General Appearance: Well nourished, in no apparent distress. Eyes: PERRLA, EOMs, conjunctiva no swelling or erythema, watery discharge Sinuses: Bil Frontal/maxillary tenderness ENT/Mouth: Ext aud canals clear, TMs without erythema, bulging. No erythema, swelling, or exudate on post pharynx.  Tonsils not swollen or erythematous. Hearing normal.  Neck: Supple, thyroid normal.  Respiratory: Respiratory effort normal, BS equal bilaterally without rales, rhonchi, wheezing or stridor.  Cardio: RRR with no MRGs. Brisk peripheral pulses without edema.  Abdomen: Soft, + BS.  Non tender, no guarding, rebound, hernias, masses. Lymphatics: Non tender without lymphadenopathy.  Musculoskeletal: Full ROM, 5/5 strength, antalgic gait, tenderness to L anterio/medial heel, neg straight leg raise, no SI joint tenderness Skin: Warm, dry; without  lesions, rash, ecchymoses.  Neuro: Cranial nerves intact. No cerebellar symptoms. Distal sensation intact bilateral lower extremities.  Psych: Awake and oriented X 3, normal affect, Insight and Judgment appropriate.    Izora Ribas, NP 1:17 PM [facility]

## 2020-01-15 ENCOUNTER — Encounter: Payer: Self-pay | Admitting: Adult Health

## 2020-01-15 ENCOUNTER — Other Ambulatory Visit: Payer: Self-pay

## 2020-01-15 ENCOUNTER — Ambulatory Visit (INDEPENDENT_AMBULATORY_CARE_PROVIDER_SITE_OTHER): Payer: Medicare Other | Admitting: Adult Health

## 2020-01-15 VITALS — BP 110/68 | HR 90 | Temp 97.9°F | Ht 61.0 in | Wt 112.0 lb

## 2020-01-15 DIAGNOSIS — R7309 Other abnormal glucose: Secondary | ICD-10-CM

## 2020-01-15 DIAGNOSIS — J449 Chronic obstructive pulmonary disease, unspecified: Secondary | ICD-10-CM | POA: Diagnosis not present

## 2020-01-15 DIAGNOSIS — Z79899 Other long term (current) drug therapy: Secondary | ICD-10-CM | POA: Diagnosis not present

## 2020-01-15 DIAGNOSIS — J019 Acute sinusitis, unspecified: Secondary | ICD-10-CM | POA: Diagnosis not present

## 2020-01-15 DIAGNOSIS — Z6820 Body mass index (BMI) 20.0-20.9, adult: Secondary | ICD-10-CM

## 2020-01-15 DIAGNOSIS — E559 Vitamin D deficiency, unspecified: Secondary | ICD-10-CM | POA: Diagnosis not present

## 2020-01-15 DIAGNOSIS — E039 Hypothyroidism, unspecified: Secondary | ICD-10-CM | POA: Diagnosis not present

## 2020-01-15 DIAGNOSIS — E782 Mixed hyperlipidemia: Secondary | ICD-10-CM | POA: Diagnosis not present

## 2020-01-15 DIAGNOSIS — F3341 Major depressive disorder, recurrent, in partial remission: Secondary | ICD-10-CM

## 2020-01-15 MED ORDER — AMOXICILLIN 500 MG PO TABS: 500.0000 mg | ORAL_TABLET | Freq: Three times a day (TID) | ORAL | 0 refills | Status: DC

## 2020-01-15 MED ORDER — AMITRIPTYLINE HCL 50 MG PO TABS: 50.0000 mg | ORAL_TABLET | Freq: Every day | ORAL | 0 refills | Status: DC

## 2020-01-15 NOTE — Patient Instructions (Signed)
Increase allergy med to daily - start 2 weeks prior to season, continue until end to prevent recurrent sinusitis  Flonase daily PRN, if having congestion or sinusitis increase to twice daily   Consider saline nasal irrigations    Taper off of lexapro - 1/2 tab for 2 weeks then stop  Start amitriptyline 1/2 tab (25 mg) in the evening for 1-2 weeks, then if tolerating can try increasing to whole tab for mood, sleep, functional dyspepsia    Amitriptyline tablets What is this medicine? AMITRIPTYLINE (a mee TRIP ti leen) is used to treat depression. This medicine may be used for other purposes; ask your health care provider or pharmacist if you have questions. COMMON BRAND NAME(S): Elavil, Vanatrip What should I tell my health care provider before I take this medicine? They need to know if you have any of these conditions:  an alcohol problem  asthma, difficulty breathing  bipolar disorder or schizophrenia  difficulty passing urine, prostate trouble  glaucoma  heart disease or previous heart attack  liver disease  over active thyroid  seizures  thoughts or plans of suicide, a previous suicide attempt, or family history of suicide attempt  an unusual or allergic reaction to amitriptyline, other medicines, foods, dyes, or preservatives  pregnant or trying to get pregnant  breast-feeding How should I use this medicine? Take this medicine by mouth with a drink of water. Follow the directions on the prescription label. You can take the tablets with or without food. Take your medicine at regular intervals. Do not take it more often than directed. Do not stop taking this medicine suddenly except upon the advice of your doctor. Stopping this medicine too quickly may cause serious side effects or your condition may worsen. A special MedGuide will be given to you by the pharmacist with each prescription and refill. Be sure to read this information carefully each time. Talk to  your pediatrician regarding the use of this medicine in children. Special care may be needed. Overdosage: If you think you have taken too much of this medicine contact a poison control center or emergency room at once. NOTE: This medicine is only for you. Do not share this medicine with others. What if I miss a dose? If you miss a dose, take it as soon as you can. If it is almost time for your next dose, take only that dose. Do not take double or extra doses. What may interact with this medicine? Do not take this medicine with any of the following medications:  arsenic trioxide  certain medicines used to regulate abnormal heartbeat or to treat other heart conditions  cisapride  droperidol  halofantrine  linezolid  MAOIs like Carbex, Eldepryl, Marplan, Nardil, and Parnate  methylene blue  other medicines for mental depression  phenothiazines like perphenazine, thioridazine and chlorpromazine  pimozide  probucol  procarbazine  sparfloxacin  St. John's Wort This medicine may also interact with the following medications:  atropine and related drugs like hyoscyamine, scopolamine, tolterodine and others  barbiturate medicines for inducing sleep or treating seizures, like phenobarbital  cimetidine  disulfiram  ethchlorvynol  thyroid hormones such as levothyroxine  ziprasidone This list may not describe all possible interactions. Give your health care provider a list of all the medicines, herbs, non-prescription drugs, or dietary supplements you use. Also tell them if you smoke, drink alcohol, or use illegal drugs. Some items may interact with your medicine. What should I watch for while using this medicine? Tell your doctor if your  symptoms do not get better or if they get worse. Visit your doctor or health care professional for regular checks on your progress. Because it may take several weeks to see the full effects of this medicine, it is important to continue your  treatment as prescribed by your doctor. Patients and their families should watch out for new or worsening thoughts of suicide or depression. Also watch out for sudden changes in feelings such as feeling anxious, agitated, panicky, irritable, hostile, aggressive, impulsive, severely restless, overly excited and hyperactive, or not being able to sleep. If this happens, especially at the beginning of treatment or after a change in dose, call your health care professional. Isabel Tyler may get drowsy or dizzy. Do not drive, use machinery, or do anything that needs mental alertness until you know how this medicine affects you. Do not stand or sit up quickly, especially if you are an older patient. This reduces the risk of dizzy or fainting spells. Alcohol may interfere with the effect of this medicine. Avoid alcoholic drinks. Do not treat yourself for coughs, colds, or allergies without asking your doctor or health care professional for advice. Some ingredients can increase possible side effects. Your mouth may get dry. Chewing sugarless gum or sucking hard candy, and drinking plenty of water will help. Contact your doctor if the problem does not go away or is severe. This medicine may cause dry eyes and blurred vision. If you wear contact lenses you may feel some discomfort. Lubricating drops may help. See your eye doctor if the problem does not go away or is severe. This medicine can cause constipation. Try to have a bowel movement at least every 2 to 3 days. If you do not have a bowel movement for 3 days, call your doctor or health care professional. This medicine can make you more sensitive to the sun. Keep out of the sun. If you cannot avoid being in the sun, wear protective clothing and use sunscreen. Do not use sun lamps or tanning beds/booths. What side effects may I notice from receiving this medicine? Side effects that you should report to your doctor or health care professional as soon as possible:  allergic  reactions like skin rash, itching or hives, swelling of the face, lips, or tongue  anxious  breathing problems  changes in vision  confusion  elevated mood, decreased need for sleep, racing thoughts, impulsive behavior  eye pain  fast, irregular heartbeat  feeling faint or lightheaded, falls  feeling agitated, angry, or irritable  fever with increased sweating  hallucination, loss of contact with reality  seizures  stiff muscles  suicidal thoughts or other mood changes  tingling, pain, or numbness in the feet or hands  trouble passing urine or change in the amount of urine  trouble sleeping  unusually weak or tired  vomiting  yellowing of the eyes or skin Side effects that usually do not require medical attention (report to your doctor or health care professional if they continue or are bothersome):  change in sex drive or performance  change in appetite or weight  constipation  dizziness  dry mouth  nausea  tired  tremors  upset stomach This list may not describe all possible side effects. Call your doctor for medical advice about side effects. You may report side effects to FDA at 1-800-FDA-1088. Where should I keep my medicine? Keep out of the reach of children. Store at room temperature between 20 and 25 degrees C (68 and 77 degrees F). Throw  away any unused medicine after the expiration date. NOTE: This sheet is a summary. It may not cover all possible information. If you have questions about this medicine, talk to your doctor, pharmacist, or health care provider.  2020 Elsevier/Gold Standard (2018-08-06 13:04:32)

## 2020-01-16 ENCOUNTER — Other Ambulatory Visit: Payer: Self-pay | Admitting: Adult Health

## 2020-01-16 DIAGNOSIS — E039 Hypothyroidism, unspecified: Secondary | ICD-10-CM

## 2020-01-16 LAB — CBC WITH DIFFERENTIAL/PLATELET
Absolute Monocytes: 616 cells/uL (ref 200–950)
Basophils Absolute: 49 cells/uL (ref 0–200)
Basophils Relative: 0.8 %
Eosinophils Absolute: 18 cells/uL (ref 15–500)
Eosinophils Relative: 0.3 %
HCT: 41.4 % (ref 35.0–45.0)
Hemoglobin: 13.9 g/dL (ref 11.7–15.5)
Lymphs Abs: 1019 cells/uL (ref 850–3900)
MCH: 32 pg (ref 27.0–33.0)
MCHC: 33.6 g/dL (ref 32.0–36.0)
MCV: 95.4 fL (ref 80.0–100.0)
MPV: 10.1 fL (ref 7.5–12.5)
Monocytes Relative: 10.1 %
Neutro Abs: 4398 cells/uL (ref 1500–7800)
Neutrophils Relative %: 72.1 %
Platelets: 239 10*3/uL (ref 140–400)
RBC: 4.34 10*6/uL (ref 3.80–5.10)
RDW: 11.5 % (ref 11.0–15.0)
Total Lymphocyte: 16.7 %
WBC: 6.1 10*3/uL (ref 3.8–10.8)

## 2020-01-16 LAB — LIPID PANEL
Cholesterol: 190 mg/dL (ref <200)
HDL: 73 mg/dL (ref >=50)
LDL Cholesterol (Calc): 103 mg/dL (calc) — ABNORMAL HIGH
Non-HDL Cholesterol (Calc): 117 mg/dL (calc) (ref <130)
Total CHOL/HDL Ratio: 2.6 (calc) (ref <5.0)
Triglycerides: 54 mg/dL (ref <150)

## 2020-01-16 LAB — MAGNESIUM: Magnesium: 2.1 mg/dL (ref 1.5–2.5)

## 2020-01-16 LAB — COMPLETE METABOLIC PANEL WITH GFR
AG Ratio: 1.7 (calc) (ref 1.0–2.5)
ALT: 27 U/L (ref 6–29)
AST: 22 U/L (ref 10–35)
Albumin: 4.5 g/dL (ref 3.6–5.1)
Alkaline phosphatase (APISO): 56 U/L (ref 37–153)
BUN: 11 mg/dL (ref 7–25)
CO2: 28 mmol/L (ref 20–32)
Calcium: 9.3 mg/dL (ref 8.6–10.4)
Chloride: 102 mmol/L (ref 98–110)
Creat: 0.62 mg/dL (ref 0.50–0.99)
GFR, Est African American: 109 mL/min/{1.73_m2} (ref >=60)
GFR, Est Non African American: 94 mL/min/{1.73_m2} (ref >=60)
Globulin: 2.6 g/dL (calc) (ref 1.9–3.7)
Glucose, Bld: 82 mg/dL (ref 65–99)
Potassium: 4.4 mmol/L (ref 3.5–5.3)
Sodium: 139 mmol/L (ref 135–146)
Total Bilirubin: 0.6 mg/dL (ref 0.2–1.2)
Total Protein: 7.1 g/dL (ref 6.1–8.1)

## 2020-01-16 LAB — TSH: TSH: 0.32 mIU/L — ABNORMAL LOW (ref 0.40–4.50)

## 2020-02-10 ENCOUNTER — Ambulatory Visit (INDEPENDENT_AMBULATORY_CARE_PROVIDER_SITE_OTHER): Payer: Medicare Other | Admitting: *Deleted

## 2020-02-10 ENCOUNTER — Other Ambulatory Visit: Payer: Self-pay

## 2020-02-10 DIAGNOSIS — E039 Hypothyroidism, unspecified: Secondary | ICD-10-CM

## 2020-02-10 LAB — TSH: TSH: 0.69 mIU/L (ref 0.40–4.50)

## 2020-02-10 NOTE — Progress Notes (Signed)
Patient is here for a NV to recheck her TSH.  The patient states she continued to take the same dose of Levothyroxine 75 mcg, 1 tablet daily, and did not reduce the dose to 1/2 tablet 1 day a week  and 1 tablet 6 days. She also reported she has weaned off the Lexapro and has recently started Amitriptyline 50 mg at bedtime. The only side effect has been some constipation and she has started taking Citrucel.

## 2020-02-12 ENCOUNTER — Telehealth: Payer: Self-pay | Admitting: *Deleted

## 2020-02-12 ENCOUNTER — Other Ambulatory Visit: Payer: Self-pay | Admitting: *Deleted

## 2020-02-12 DIAGNOSIS — L719 Rosacea, unspecified: Secondary | ICD-10-CM | POA: Diagnosis not present

## 2020-02-12 DIAGNOSIS — K219 Gastro-esophageal reflux disease without esophagitis: Secondary | ICD-10-CM

## 2020-02-12 NOTE — Telephone Encounter (Signed)
Recent lab results given to patient. Patient states she is no longer taking Lexapro and Omeprazole, due to recent medication change. No refill sent in.

## 2020-02-26 NOTE — Progress Notes (Deleted)
Assessment and Plan:  There are no diagnoses linked to this encounter.    Further disposition pending results of labs. Discussed med's effects and SE's.   Over 30 minutes of exam, counseling, chart review, and critical decision making was performed.   Future Appointments  Date Time Provider [place]  02/27/2020 10:00 AM Liane Comber, NP [facility] None  04/16/2020 10:00 AM Vicie Mutters, PA-C [facility] None  08/10/2020 10:00 AM Unk Pinto, MD [facility] None  11/10/2020 10:00 AM Liane Comber, NP [facility] None    ------------------------------------------------------------------------------------------------------------------   HPI 69 y.o.female presents for  Past Medical History:  Diagnosis Date  . Anxiety   . Diverticular disease   . Hx of diverticulitis of colon 04/04/2017  . Hyperlipidemia, mixed   . Hypothyroidism 2000  . Vitamin D deficiency      Allergies  Allergen Reactions  . Ciprofloxacin Other (See Comments)    Alleged tendonitis    Current Outpatient Medications on File Prior to Visit  Medication Sig  . alendronate (FOSAMAX) 70 MG tablet TAKE 1 TABLET BY MOUTH EVERY WEEK IN THE MORNING ON AN EMPTY STOMACH WITH ONLY A GLASS OF WATER; NOTHING BY MOUTH FOR 1 HOUR  . amitriptyline (ELAVIL) 50 MG tablet Take 1 tablet (50 mg total) by mouth at bedtime. For sleep, mood and functional dyspepsia.  Marland Kitchen amoxicillin (AMOXIL) 500 MG tablet Take 1 tablet (500 mg total) by mouth 3 (three) times daily.  . Ascorbic Acid (VITAMIN C) 1000 MG tablet Take 1,000 mg by mouth daily.  Marland Kitchen aspirin EC 81 MG tablet Take 81 mg by mouth daily.  . Brimonidine Tartrate 0.33 % GEL Apply 1 application topically daily as needed.  . Calcium Citrate (CITRACAL PO) Take 1 tablet by mouth daily.  . Cholecalciferol (VITAMIN D-3) 5000 units TABS Take 1 tablet by mouth daily.  . Flaxseed, Linseed, (FLAXSEED OIL) 1200 MG CAPS Take 1 capsule by mouth daily.  Marland Kitchen levothyroxine  (SYNTHROID) 75 MCG tablet Take 1 tablet daily on an empty stomach with only water for 30 minutes & no Antacid meds, Calcium or Magnesium for 4 hours & avoid Biotin  . Omega-3 Fatty Acids (FISH OIL) 1200 MG CAPS Take 1 capsule by mouth daily.  Marland Kitchen OVER THE COUNTER MEDICATION Patient takes "Strenght of [place]" organic apple cider vinegar with added herbs and spices. 1-2 TBSP daily.  . Probiotic Product (PROBIOTIC-10) CAPS Take 1 capsule by mouth daily.  . rosuvastatin (CRESTOR) 40 MG tablet TAKE 1/2 TO 1 TABLET BY MOUTH DAILY OR AS DIRECTED FOR CHOLESTEROL  . TURMERIC PO Take 1,400 mg by mouth daily.   No current facility-administered medications on file prior to visit.    ROS: all negative except above.   Physical Exam:  There were no vitals taken for this visit.  General Appearance: Well nourished, in no apparent distress. Eyes: PERRLA, EOMs, conjunctiva no swelling or erythema Sinuses: No Frontal/maxillary tenderness ENT/Mouth: Ext aud canals clear, TMs without erythema, bulging. No erythema, swelling, or exudate on post pharynx.  Tonsils not swollen or erythematous. Hearing normal.  Neck: Supple, thyroid normal.  Respiratory: Respiratory effort normal, BS equal bilaterally without rales, rhonchi, wheezing or stridor.  Cardio: RRR with no MRGs. Brisk peripheral pulses without edema.  Abdomen: Soft, + BS.  Non tender, no guarding, rebound, hernias, masses. Lymphatics: Non tender without lymphadenopathy.  Musculoskeletal: Full ROM, 5/5 strength, normal gait.  Skin: Warm, dry without rashes, lesions, ecchymosis.  Neuro: Cranial nerves intact. Normal muscle tone, no cerebellar symptoms. Sensation  intact.  Psych: Awake and oriented X 3, normal affect, Insight and Judgment appropriate.     Izora Ribas, NP 12:58 PM [facility]

## 2020-02-27 ENCOUNTER — Ambulatory Visit: Payer: Medicare Other | Admitting: Adult Health

## 2020-03-04 ENCOUNTER — Other Ambulatory Visit: Payer: Self-pay | Admitting: Adult Health

## 2020-03-04 MED ORDER — AMITRIPTYLINE HCL 25 MG PO TABS
25.0000 mg | ORAL_TABLET | Freq: Every day | ORAL | 1 refills | Status: DC
Start: 2020-03-04 — End: 2020-03-29

## 2020-03-22 NOTE — Progress Notes (Signed)
Assessment and Plan:  Isabel Tyler was seen today for nasal congestion, dental pain and cough.  Diagnoses and all orders for this visit:  Acute non-recurrent pansinusitis -     azithromycin (ZITHROMAX) 250 MG tablet; Take 2 tablets (500 mg) on  Day 1,  followed by 1 tablet (250 mg) once daily on Days 2 through 5.  Cough Discussed multiple etiologies, has been chronic Sinus vs gerd? Try zyrtec at night while sinusitis persists  Gastroesophageal reflux disease, unspecified whether esophagitis present Consider OTC famotiadine daily OR BID after clearing os sinusitis  Excessive gas Hx of Diverticulosis of colon without hemorrhage OTC Gas-x mindful of food choices  Major depressive disorder, recurrent episode, in partial remission ([place]) Recently weaned off lexapro to amitriptyline to see if this would help with cough. Has noticed no difference, considering changing back after resolution of sinus symptoms.  Discussed taper. Contact office if refill is needed.      Further disposition pending results of labs. Discussed med's effects and SE's.   Over 30 minutes of face to face interview, exam, counseling, chart review, and critical decision making was performed.   Future Appointments  Date Time Provider [place]  04/16/2020 10:00 AM Vicie Mutters, PA-C [facility] None  08/10/2020 10:00 AM Unk Pinto, MD [facility] None  11/10/2020 10:00 AM Liane Comber, NP [facility] None    ------------------------------------------------------------------------------------------------------------------   HPI 69 y.o.female presents for evaluation for cough and congestion.  She reports she was visiting her grandchildren and they had similar symptoms. She is having thick phlem and coughing and drainage for the past fives days.   She has sinus pressure and pain in her teeth.  Increasing pressure in her sinuses and head.  Denies any fever or chills.  She reports getting hot flashes  every now and again but has had increasing fatigue. She has been battles with chronic cough and has eval from ENT in past. She is also reporting an increase in gas and bloating.  She reports she is not able to link this to any  Particular food but seems to happen in the evenings.  She reports she is eating small meals.  She denies abdominal pains, nausea vomiting, constipation or diarrhea.  Past Medical History:  Diagnosis Date  . Anxiety   . Diverticular disease   . Hx of diverticulitis of colon 04/04/2017  . Hyperlipidemia, mixed   . Hypothyroidism 2000  . Vitamin D deficiency      Allergies  Allergen Reactions  . Ciprofloxacin Other (See Comments)    Alleged tendonitis    Current Outpatient Medications on File Prior to Visit  Medication Sig  . alendronate (FOSAMAX) 70 MG tablet TAKE 1 TABLET BY MOUTH EVERY WEEK IN THE MORNING ON AN EMPTY STOMACH WITH ONLY A GLASS OF WATER; NOTHING BY MOUTH FOR 1 HOUR  . Ascorbic Acid (VITAMIN C) 1000 MG tablet Take 1,000 mg by mouth daily.  Marland Kitchen aspirin EC 81 MG tablet Take 81 mg by mouth daily.  . Brimonidine Tartrate 0.33 % GEL Apply 1 application topically daily as needed.  . Calcium Citrate (CITRACAL PO) Take 1 tablet by mouth daily.  . Cholecalciferol (VITAMIN D-3) 5000 units TABS Take 1 tablet by mouth daily.  . Flaxseed, Linseed, (FLAXSEED OIL) 1200 MG CAPS Take 1 capsule by mouth daily.  Marland Kitchen levothyroxine (SYNTHROID) 75 MCG tablet Take 1 tablet daily on an empty stomach with only water for 30 minutes & no Antacid meds, Calcium or Magnesium for 4 hours & avoid Biotin  .  Omega-3 Fatty Acids (FISH OIL) 1200 MG CAPS Take 1 capsule by mouth daily.  Marland Kitchen OVER THE COUNTER MEDICATION Patient takes "Strenght of [place]" organic apple cider vinegar with added herbs and spices. 1-2 TBSP daily.  . Probiotic Product (PROBIOTIC-10) CAPS Take 1 capsule by mouth daily.  . rosuvastatin (CRESTOR) 40 MG tablet TAKE 1/2 TO 1 TABLET BY MOUTH DAILY OR AS DIRECTED FOR  CHOLESTEROL  . TURMERIC PO Take 1,400 mg by mouth daily.   No current facility-administered medications on file prior to visit.    ROS: all negative except above.   Physical Exam:  BP (!) 122/88   Pulse 88   Temp (!) 97.3 F (36.3 C)   Wt 111 lb (50.3 kg)   SpO2 98%   BMI 20.97 kg/m   General Appearance: Well nourished, in no apparent distress. Eyes: PERRLA, EOMs, conjunctiva no swelling or erythema Sinuses: Frontal/maxillary tenderness ENT/Mouth: Ext aud canals clear, TMs without erythema, bulging. No erythema, swelling, or exudate on post pharynx.  Tonsils not swollen or erythematous. Hearing normal.  Neck: Supple, thyroid normal.  Respiratory: Respiratory effort normal, BS equal bilaterally without rales, rhonchi, wheezing or stridor.  Cardio: RRR with no MRGs. Brisk peripheral pulses without edema.  Abdomen: Soft, + BS.  Non tender, no guarding, rebound, hernias, masses. Lymphatics: Non tender without lymphadenopathy.  Musculoskeletal: Full ROM, 5/5 strength, normal gait.  Skin: Warm, dry without rashes, lesions, ecchymosis.  Neuro: Cranial nerves intact. Normal muscle tone, no cerebellar symptoms. Sensation intact.  Psych: Awake and oriented X 3, normal affect, Insight and Judgment appropriate.     [name], NP 12:35 PM [facility]

## 2020-03-23 ENCOUNTER — Ambulatory Visit (INDEPENDENT_AMBULATORY_CARE_PROVIDER_SITE_OTHER): Payer: Medicare Other | Admitting: Adult Health Nurse Practitioner

## 2020-03-23 ENCOUNTER — Other Ambulatory Visit: Payer: Self-pay

## 2020-03-23 ENCOUNTER — Encounter: Payer: Self-pay | Admitting: Adult Health Nurse Practitioner

## 2020-03-23 VITALS — BP 122/88 | HR 88 | Temp 97.3°F | Wt 111.0 lb

## 2020-03-23 DIAGNOSIS — K219 Gastro-esophageal reflux disease without esophagitis: Secondary | ICD-10-CM | POA: Diagnosis not present

## 2020-03-23 DIAGNOSIS — R143 Flatulence: Secondary | ICD-10-CM | POA: Diagnosis not present

## 2020-03-23 DIAGNOSIS — R05 Cough: Secondary | ICD-10-CM | POA: Diagnosis not present

## 2020-03-23 DIAGNOSIS — F3341 Major depressive disorder, recurrent, in partial remission: Secondary | ICD-10-CM

## 2020-03-23 DIAGNOSIS — K5730 Diverticulosis of large intestine without perforation or abscess without bleeding: Secondary | ICD-10-CM

## 2020-03-23 DIAGNOSIS — J0140 Acute pansinusitis, unspecified: Secondary | ICD-10-CM | POA: Diagnosis not present

## 2020-03-23 DIAGNOSIS — R059 Cough, unspecified: Secondary | ICD-10-CM

## 2020-03-23 MED ORDER — AZITHROMYCIN 250 MG PO TABS: ORAL_TABLET | ORAL | 1 refills | Status: AC

## 2020-03-23 NOTE — Patient Instructions (Addendum)
  Try Simithocone (Gas-X) two tablets before each meal.  This is to help break up gas.      Allergy Symptoms / Runny Nose: Chose one  Zyrtec / Cetirizine Take 10mg  by mouth May cause drowsiness, take nightly Be sure to drink plenty of water If this is not effective, try Xyzal or Allegra  OR  Xyzal / Levocetirazine  Take 5mg  by mouth May cause drowsiness, take nightly Be sure to drink plenty of water If this is not effective try Allegra or Zyrtec  OR  Allegra / fexofenadine Take 180mg  by mouth daily If this is not effective try Zyrtec or Xyzal   *If you battle with chronic allergies you may need to change the antihistamine you currently use to find most effective.    Saline Nasal Spray: You can get this at any pharmacy Use as directed on package This will help to sooth inside of your nose from irritation  [facility] Sinus Rinse / Neti Pot Use warm bottled or distilled water DO NOT use tap water! Use twice a day as needed This will help to sooth irritated sinuses and clear nasal congestion If using nasal sprays, do so after completing this.   For Sinus Pain:  Tylenol Extra Strength 500mg  Take 1-2 tablets every 8 hours as needed for fever or pain Do not take more than 4,000mg  to tylenol in 24 hours  May rotate between Tylenol and ibuprofen.  They are different.

## 2020-03-29 ENCOUNTER — Other Ambulatory Visit: Payer: Self-pay | Admitting: Adult Health Nurse Practitioner

## 2020-03-29 DIAGNOSIS — F3341 Major depressive disorder, recurrent, in partial remission: Secondary | ICD-10-CM

## 2020-03-29 MED ORDER — ESCITALOPRAM OXALATE 20 MG PO TABS: ORAL_TABLET | ORAL | 2 refills | Status: DC

## 2020-03-30 ENCOUNTER — Other Ambulatory Visit: Payer: Self-pay | Admitting: Adult Health Nurse Practitioner

## 2020-03-30 DIAGNOSIS — K581 Irritable bowel syndrome with constipation: Secondary | ICD-10-CM

## 2020-03-30 MED ORDER — DICYCLOMINE HCL 10 MG PO CAPS: ORAL_CAPSULE | ORAL | 0 refills | Status: DC

## 2020-04-16 ENCOUNTER — Encounter: Payer: Self-pay | Admitting: Physician Assistant

## 2020-04-16 ENCOUNTER — Other Ambulatory Visit: Payer: Self-pay

## 2020-04-16 ENCOUNTER — Ambulatory Visit (INDEPENDENT_AMBULATORY_CARE_PROVIDER_SITE_OTHER): Payer: Medicare Other | Admitting: Physician Assistant

## 2020-04-16 VITALS — BP 114/66 | HR 66 | Temp 96.8°F | Wt 113.2 lb

## 2020-04-16 DIAGNOSIS — K581 Irritable bowel syndrome with constipation: Secondary | ICD-10-CM

## 2020-04-16 DIAGNOSIS — R7309 Other abnormal glucose: Secondary | ICD-10-CM | POA: Diagnosis not present

## 2020-04-16 DIAGNOSIS — F3341 Major depressive disorder, recurrent, in partial remission: Secondary | ICD-10-CM | POA: Diagnosis not present

## 2020-04-16 DIAGNOSIS — E039 Hypothyroidism, unspecified: Secondary | ICD-10-CM | POA: Diagnosis not present

## 2020-04-16 DIAGNOSIS — J449 Chronic obstructive pulmonary disease, unspecified: Secondary | ICD-10-CM | POA: Diagnosis not present

## 2020-04-16 DIAGNOSIS — Z79899 Other long term (current) drug therapy: Secondary | ICD-10-CM | POA: Diagnosis not present

## 2020-04-16 DIAGNOSIS — K219 Gastro-esophageal reflux disease without esophagitis: Secondary | ICD-10-CM | POA: Diagnosis not present

## 2020-04-16 DIAGNOSIS — R030 Elevated blood-pressure reading, without diagnosis of hypertension: Secondary | ICD-10-CM

## 2020-04-16 DIAGNOSIS — E782 Mixed hyperlipidemia: Secondary | ICD-10-CM | POA: Diagnosis not present

## 2020-04-16 LAB — COMPLETE METABOLIC PANEL WITH GFR
AG Ratio: 1.8 (calc) (ref 1.0–2.5)
ALT: 17 U/L (ref 6–29)
AST: 18 U/L (ref 10–35)
Albumin: 4.1 g/dL (ref 3.6–5.1)
Alkaline phosphatase (APISO): 48 U/L (ref 37–153)
BUN: 19 mg/dL (ref 7–25)
CO2: 32 mmol/L (ref 20–32)
Calcium: 9.5 mg/dL (ref 8.6–10.4)
Chloride: 104 mmol/L (ref 98–110)
Creat: 0.64 mg/dL (ref 0.50–0.99)
GFR, Est African American: 108 mL/min/{1.73_m2} (ref >=60)
GFR, Est Non African American: 93 mL/min/{1.73_m2} (ref >=60)
Globulin: 2.3 g/dL (calc) (ref 1.9–3.7)
Glucose, Bld: 81 mg/dL (ref 65–99)
Potassium: 5 mmol/L (ref 3.5–5.3)
Sodium: 140 mmol/L (ref 135–146)
Total Bilirubin: 0.4 mg/dL (ref 0.2–1.2)
Total Protein: 6.4 g/dL (ref 6.1–8.1)

## 2020-04-16 LAB — CBC WITH DIFFERENTIAL/PLATELET
Absolute Monocytes: 360 cells/uL (ref 200–950)
Basophils Absolute: 48 cells/uL (ref 0–200)
Basophils Relative: 1 %
Eosinophils Absolute: 48 cells/uL (ref 15–500)
Eosinophils Relative: 1 %
HCT: 38.2 % (ref 35.0–45.0)
Hemoglobin: 12.6 g/dL (ref 11.7–15.5)
Lymphs Abs: 1392 cells/uL (ref 850–3900)
MCH: 31.8 pg (ref 27.0–33.0)
MCHC: 33 g/dL (ref 32.0–36.0)
MCV: 96.5 fL (ref 80.0–100.0)
MPV: 10 fL (ref 7.5–12.5)
Monocytes Relative: 7.5 %
Neutro Abs: 2952 cells/uL (ref 1500–7800)
Neutrophils Relative %: 61.5 %
Platelets: 235 10*3/uL (ref 140–400)
RBC: 3.96 10*6/uL (ref 3.80–5.10)
RDW: 11.8 % (ref 11.0–15.0)
Total Lymphocyte: 29 %
WBC: 4.8 10*3/uL (ref 3.8–10.8)

## 2020-04-16 LAB — LIPID PANEL
Cholesterol: 173 mg/dL (ref <200)
HDL: 71 mg/dL (ref >=50)
LDL Cholesterol (Calc): 88 mg/dL (calc)
Non-HDL Cholesterol (Calc): 102 mg/dL (calc) (ref <130)
Total CHOL/HDL Ratio: 2.4 (calc) (ref <5.0)
Triglycerides: 53 mg/dL (ref <150)

## 2020-04-16 LAB — MAGNESIUM: Magnesium: 2 mg/dL (ref 1.5–2.5)

## 2020-04-16 LAB — TSH: TSH: 0.62 mIU/L (ref 0.40–4.50)

## 2020-04-16 NOTE — Progress Notes (Signed)
FOLLOW UP  Assessment and Plan:   Hypothyroidism continue medications the same pending lab results reminded to take on an empty stomach 30-60mins before food.  check TSH level  Cholesterol At goal on rosuvastatin; tirtrate as needed for LDL goal <100 Continue low cholesterol diet and exercise.  Check lipid panel.   Hypothyroidism continue medications the same pending lab results reminded to take on an empty stomach 30-53mins before food.  check TSH level  BMI 21 Continue to recommend diet heavy in fruits and veggies and low in animal meats, cheeses, and dairy products, appropriate calorie intake Discuss exercise recommendations routinely Continue to monitor weight at each visit  Vitamin D Def At goal at last visit; continue supplementation to maintain goal of 60-100 Defer Vit D level  Depression/anxiety Continue lexapro Lifestyle discussed: diet/exerise, sleep hygiene, stress management, hydration  Insomnia  - improved with lifestyle modification and magnesium/melatonin PRN  Upper abdominal bloating/belching/fullness Ongoing for many years, per patient not improved s/p cholecystectomy, had normal EGD/h pylori, no hiatal hernia. Has done several trials of LOW DOSE PPI without improvement Continue probiotic disucssed needing testing for SBO and potential ph testing with GI, she will think about it Avoid tumeric/vitamin C Discussed pelvic floor PT for possible incomplete evacuation of bowels- will also get squatty potty  Continue diet and meds as discussed. Further disposition pending results of labs. Discussed med's effects and SE's.   Over 30 minutes of exam, counseling, chart review, and critical decision making was performed.   Future Appointments  Date Time Provider [place]  08/10/2020 10:00 AM Unk Pinto, MD [facility] None  11/10/2020 10:00 AM Liane Comber, NP [facility] None     ----------------------------------------------------------------------------------------------------------------------  HPI 69 y.o. female  presents for 3 month follow up on cholesterol, hypothyroid, depression and vitamin D deficiency.  She has had recurrent sinus infections, last one was March and then treated 03/23/20 for sinus infection. Given Zpak, had Gi issues. She did have both covid 19 vaccines, in March.  She was not tested for COVID. She was given zpak.  She is still on sinus allergy daily.  When she took a zpak this last time, she had severe AB cramping, diarrhea every 30 mins but that has improved. No diarrhea right now but she just moves her bowels frequently for years, 5 x a day. Feels she has incomplete evacuation.  She states worse with increased greens/fiber. She has a lot of burping/beltching, no increased AB bloating.   She has chronic cough, amitriptyline did not help. She has the increased gas. She has had GI issues, burping, gas etc for a long time. She was given amitriptyline for possible cough but did not see a difference. Started back on lexapro.  Has tried omeprazole twice only the 20 mg.   She states every christmas, she loves christmas but she as worsening gas during the time. Peppermint candy helps  She is on probiotic daily.  She had colonoscopy 2016, [place]. Due 2026. Had EGD with negative Hyplori 2016   2 adult kids, no urinary issues, painful intercourse   Denies reflux, hoarseness, burning.  She reports had [place] which showed gallstones, had cholecystectomy without improvement in sx.   she has a diagnosis of anxiety and is currently on lexapro 20 mg daily, reports symptoms are well controlled on current regimen. She has tried to taper off several times but hasn't done well. Takes magnesium and melatonin for insomnia with intermittent benefit.   BMI is Body mass index is 21.39  kg/m., she has been working on diet and exercise. Wt Readings from  Last 3 Encounters:  04/16/20 113 lb 3.2 oz (51.3 kg)  03/23/20 111 lb (50.3 kg)  02/10/20 110 lb 12.8 oz (50.3 kg)   She does not check BP at home, today their BP is BP: 114/66  She does workout. She denies chest pain, shortness of breath, dizziness.   She is on cholesterol medication (rosuvastatin 20 mg three days weekly) and denies myalgias. Her cholesterol is at goal. The cholesterol last visit was:   Lab Results  Component Value Date   CHOL 190 01/15/2020   HDL 73 01/15/2020   LDLCALC 103 (H) 01/15/2020   TRIG 54 01/15/2020   CHOLHDL 2.6 01/15/2020    She has been working on diet and exercise for glucose management, and denies foot ulcerations, increased appetite, nausea, paresthesia of the feet, polydipsia, polyuria, visual disturbances, vomiting and weight loss. Last A1C in the office was:  Lab Results  Component Value Date   HGBA1C 5.3 07/03/2019   She is on thyroid medication. Her medication was not changed last visit.   Lab Results  Component Value Date   TSH 0.69 02/10/2020   Patient is on Vitamin D supplement and at goal:   Lab Results  Component Value Date   VD25OH 68 07/03/2019        Current Medications:  Current Outpatient Medications on File Prior to Visit  Medication Sig  . alendronate (FOSAMAX) 70 MG tablet TAKE 1 TABLET BY MOUTH EVERY WEEK IN THE MORNING ON AN EMPTY STOMACH WITH ONLY A GLASS OF WATER; NOTHING BY MOUTH FOR 1 HOUR  . Ascorbic Acid (VITAMIN C) 1000 MG tablet Take 1,000 mg by mouth daily.  Marland Kitchen aspirin EC 81 MG tablet Take 81 mg by mouth daily.  . Brimonidine Tartrate 0.33 % GEL Apply 1 application topically daily as needed.  . Calcium Citrate (CITRACAL PO) Take 1 tablet by mouth daily.  . Cholecalciferol (VITAMIN D-3) 5000 units TABS Take 1 tablet by mouth daily.  Marland Kitchen dicyclomine (BENTYL) 10 MG capsule Take one tablet up to four times a day as needed.  Take before meals.  Marland Kitchen escitalopram (LEXAPRO) 20 MG tablet Take one tablet daily.  .  Flaxseed, Linseed, (FLAXSEED OIL) 1200 MG CAPS Take 1 capsule by mouth daily.  Marland Kitchen levothyroxine (SYNTHROID) 75 MCG tablet Take 1 tablet daily on an empty stomach with only water for 30 minutes & no Antacid meds, Calcium or Magnesium for 4 hours & avoid Biotin  . Omega-3 Fatty Acids (FISH OIL) 1200 MG CAPS Take 1 capsule by mouth daily.  Marland Kitchen OVER THE COUNTER MEDICATION Patient takes "Strenght of [place] Hill" organic apple cider vinegar with added herbs and spices. 1-2 TBSP daily.  . Probiotic Product (PROBIOTIC-10) CAPS Take 1 capsule by mouth daily.  . rosuvastatin (CRESTOR) 40 MG tablet TAKE 1/2 TO 1 TABLET BY MOUTH DAILY OR AS DIRECTED FOR CHOLESTEROL  . TURMERIC PO Take 1,400 mg by mouth daily.   No current facility-administered medications on file prior to visit.     Allergies:  Allergies  Allergen Reactions  . Ciprofloxacin Other (See Comments)    Alleged tendonitis  . Z-Pak [Azithromycin] Other (See Comments)    Patient reports that the reaction was GI upset issues.     Medical History:  Past Medical History:  Diagnosis Date  . Anxiety   . Diverticular disease   . Hx of diverticulitis of colon 04/04/2017  . Hyperlipidemia, mixed   .  Hypothyroidism 2000  . Vitamin D deficiency    Family history- Reviewed and unchanged Social history- Reviewed and unchanged   Review of Systems:  Review of Systems  Constitutional: Negative for malaise/fatigue and weight loss.  HENT: Negative for hearing loss and tinnitus.   Eyes: Negative for blurred vision and double vision.  Respiratory: Negative for cough, shortness of breath and wheezing.   Cardiovascular: Negative for chest pain, palpitations, orthopnea, claudication and leg swelling.  Gastrointestinal: Positive for abdominal pain (bloating, belching, distended after meals). Negative for blood in stool, constipation, diarrhea, heartburn, melena, nausea and vomiting.  Genitourinary: Negative.   Musculoskeletal: Negative for back pain, joint  pain and myalgias.  Skin: Negative for rash.  Neurological: Negative for dizziness, tingling, sensory change, weakness and headaches.  Endo/Heme/Allergies: Negative for polydipsia.  Psychiatric/Behavioral: Negative for depression, hallucinations and substance abuse. The patient has insomnia. The patient is not nervous/anxious.   All other systems reviewed and are negative.   Physical Exam: BP 114/66   Pulse 66   Temp (!) 96.8 F (36 C)   Wt 113 lb 3.2 oz (51.3 kg)   SpO2 95%   BMI 21.39 kg/m  Wt Readings from Last 3 Encounters:  04/16/20 113 lb 3.2 oz (51.3 kg)  03/23/20 111 lb (50.3 kg)  02/10/20 110 lb 12.8 oz (50.3 kg)   General Appearance: Well nourished, in no apparent distress. Eyes: PERRLA, EOMs, conjunctiva no swelling or erythema, watery discharge Sinuses: Bil Frontal/maxillary tenderness ENT/Mouth: Ext aud canals clear, TMs without erythema, bulging. No erythema, swelling, or exudate on post pharynx.  Tonsils not swollen or erythematous. Hearing normal.  Neck: Supple, thyroid normal.  Respiratory: Respiratory effort normal, BS equal bilaterally without rales, rhonchi, wheezing or stridor.  Cardio: RRR with no MRGs. Brisk peripheral pulses without edema.  Abdomen: Soft, + BS.  Non tender, no guarding, rebound, hernias, masses. Lymphatics: Non tender without lymphadenopathy.  Musculoskeletal: Full ROM, 5/5 strength, antalgic gait,  Skin: Warm, dry; without lesions, rash, ecchymoses.  Neuro: Cranial nerves intact. No cerebellar symptoms. Distal sensation intact bilateral lower extremities.  Psych: Awake and oriented X 3, normal affect, Insight and Judgment appropriate.    Vicie Mutters, PA-C 10:15 AM [facility]

## 2020-04-16 NOTE — Patient Instructions (Addendum)
Take tumeric/vitamin C with food  Get squatty potty for your bath room- try it out    Silent reflux: Not all heartburn burns...Marland KitchenMarland KitchenMarland Kitchen  What is LPR? Laryngopharyngeal reflux (LPR) or silent reflux is a condition in which acid that is made in the stomach travels up the esophagus (swallowing tube) and gets to the throat. Not everyone with reflux has a lot of heartburn or indigestion. In fact, many people with LPR never have heartburn. This is why LPR is called SILENT REFLUX, and the terms "Silent reflux" and "LPR" are often used interchangeably. Because LPR is silent, it is sometimes difficult to diagnose.  How can you tell if you have LPR?  Marland Kitchen Chronic hoarseness- Some people have hoarseness that comes and goes . throat clearing  . Cough . It can cause shortness of breath and cause asthma like symptoms. Marland Kitchen a feeling of a lump in the throat  . difficulty swallowing . a problem with too much nose and throat drainage.  . Some people will feel their esophagus spasm which feels like their heart beating hard and fast, this will usually be after a meal, at rest, or lying down at night.    How do I treat this? Treatment for LPR should be individualized, and your doctor will suggest the best treatment for you. Generally there are several treatments for LPR: . changing habits and diet to reduce reflux,  . medications to reduce stomach acid, and  . surgery to prevent reflux. Most people with LPR need to modify how and when they eat, as well as take some medication, to get well. Sometimes, nonprescription liquid antacids, such as Maalox, Gelucil and Mylanta are recommended. When used, these antacids should be taken four times each day - one tablespoon one hour after each meal and before bedtime. Dietary and lifestyle changes alone are not often enough to control LPR - medications that reduce stomach acid are also usually needed. These must be prescribed by our doctor.   TIPS FOR REDUCING REFLUX AND  LPR Control your LIFE-STYLE and your DIET! Marland Kitchen If you use tobacco, QUIT.  Marland Kitchen Smoking makes you reflux. After every cigarette you have some LPR.  . Don't wear clothing that is too tight, especially around the waist (trousers, corsets, belts).  . Do not lie down just after eating...in fact, do not eat within three hours of bedtime.  . You should be on a low-fat diet.  . Limit your intake of red meat.  . Limit your intake of butter.  Marland Kitchen Avoid fried foods.  . Avoid chocolate  . Avoid cheese.  Marland Kitchen Avoid eggs. Marland Kitchen Specifically avoid caffeine (especially coffee and tea), soda pop (especially cola) and mints.  . Avoid alcoholic beverages, particularly in the evening.   Please go to the ER if you have any severe AB pain, unable to hold down food/water, blood in stool or vomit, chest pain, shortness of breath, or any worsening symptoms.   Consider keeping a food diary- common causes of diarrhea are dairy, certain carbs...  FODMAP stands for fermentable oligo-, di-, mono-saccharides and polyols (1). These are the scientific terms used to classify groups of carbs that are notorious for triggering digestive symptoms like bloating, gas and stomach pain.   FODMAPs are found in a wide range of foods in varying amounts. Some foods contain just one type, while others contain several.  The main dietary sources of the four groups of FODMAPs include:  Oligosaccharides: Wheat, rye, legumes and various fruits and vegetables, such  as garlic and onions.  Disaccharides: Milk, yogurt and soft cheese. Lactose is the main carb.  Monosaccharides: Various fruit including figs and mangoes, and sweeteners such as honey and agave nectar. Fructose is the main carb.  Polyols: Certain fruits and vegetables including blackberries and lychee, as well as some low-calorie sweeteners like those in sugar-free gum.   Keep a food diary. This will help you identify foods that cause symptoms. Write down: ? What you eat and  when. ? What symptoms you have. ? When symptoms occur in relation to your meals.  Avoid foods that cause symptoms. Talk with your dietitian about other ways to get the same nutrients that are in these foods.  Eat your meals slowly, in a relaxed setting.  Aim to eat 5-6 small meals per day. Do not skip meals.  Drink enough fluids to keep your urine clear or pale yellow.  Ask your health care provider if you should take an over-the-counter probiotic during flare-ups to help restore healthy gut bacteria.  If you have cramping or diarrhea, try making your meals low in fat and high in carbohydrates. Examples of carbohydrates are pasta, rice, whole grain breads and cereals, fruits, and vegetables.  If dairy products cause your symptoms to flare up, try eating less of them. You might be able to handle yogurt better than other dairy products because it contains bacteria that help with digestion.

## 2020-06-01 ENCOUNTER — Other Ambulatory Visit: Payer: Self-pay | Admitting: Internal Medicine

## 2020-06-01 DIAGNOSIS — E782 Mixed hyperlipidemia: Secondary | ICD-10-CM

## 2020-06-01 MED ORDER — ROSUVASTATIN CALCIUM 40 MG PO TABS: ORAL_TABLET | ORAL | 0 refills | Status: DC

## 2020-06-21 DIAGNOSIS — Z01419 Encounter for gynecological examination (general) (routine) without abnormal findings: Secondary | ICD-10-CM | POA: Diagnosis not present

## 2020-06-21 DIAGNOSIS — N958 Other specified menopausal and perimenopausal disorders: Secondary | ICD-10-CM | POA: Diagnosis not present

## 2020-06-21 DIAGNOSIS — Z6821 Body mass index (BMI) 21.0-21.9, adult: Secondary | ICD-10-CM | POA: Diagnosis not present

## 2020-06-21 DIAGNOSIS — M816 Localized osteoporosis [Lequesne]: Secondary | ICD-10-CM | POA: Diagnosis not present

## 2020-06-21 DIAGNOSIS — Z1231 Encounter for screening mammogram for malignant neoplasm of breast: Secondary | ICD-10-CM | POA: Diagnosis not present

## 2020-06-21 LAB — HM DEXA SCAN

## 2020-06-28 DIAGNOSIS — Z23 Encounter for immunization: Secondary | ICD-10-CM | POA: Diagnosis not present

## 2020-07-01 ENCOUNTER — Encounter: Payer: Self-pay | Admitting: Adult Health Nurse Practitioner

## 2020-07-01 ENCOUNTER — Other Ambulatory Visit: Payer: Self-pay

## 2020-07-01 ENCOUNTER — Ambulatory Visit (INDEPENDENT_AMBULATORY_CARE_PROVIDER_SITE_OTHER): Payer: Medicare Other | Admitting: Adult Health Nurse Practitioner

## 2020-07-01 ENCOUNTER — Other Ambulatory Visit: Payer: Self-pay | Admitting: Adult Health Nurse Practitioner

## 2020-07-01 VITALS — BP 118/74 | HR 101 | Temp 96.4°F

## 2020-07-01 DIAGNOSIS — Z79899 Other long term (current) drug therapy: Secondary | ICD-10-CM

## 2020-07-01 DIAGNOSIS — Z20828 Contact with and (suspected) exposure to other viral communicable diseases: Secondary | ICD-10-CM | POA: Diagnosis not present

## 2020-07-01 DIAGNOSIS — E039 Hypothyroidism, unspecified: Secondary | ICD-10-CM

## 2020-07-01 DIAGNOSIS — K2100 Gastro-esophageal reflux disease with esophagitis, without bleeding: Secondary | ICD-10-CM

## 2020-07-01 DIAGNOSIS — J039 Acute tonsillitis, unspecified: Secondary | ICD-10-CM

## 2020-07-01 DIAGNOSIS — R0789 Other chest pain: Secondary | ICD-10-CM | POA: Diagnosis not present

## 2020-07-01 DIAGNOSIS — R000 Tachycardia, unspecified: Secondary | ICD-10-CM

## 2020-07-01 MED ORDER — PANTOPRAZOLE SODIUM 40 MG PO TBEC: 40.0000 mg | DELAYED_RELEASE_TABLET | Freq: Two times a day (BID) | ORAL | 0 refills | Status: DC

## 2020-07-01 NOTE — Patient Instructions (Signed)
° °  Take Mylanta dose now.  Start taking Pantoprazole 40mg  twice a day for two weeks.   Follow up in one week .  Contact office with any new or worsening symptoms  Seek immediate attention for any new severe pains, shortness of breath.

## 2020-07-01 NOTE — Progress Notes (Signed)
FOLLOW UP - ACUTE   Assessment and Plan:  Quincee was seen today for acute visit.  Diagnoses and all orders for this visit:  Chest pressure EKG: NS, tachycardia -     CBC with Differential/Platelet -     COMPLETE METABOLIC PANEL WITH GFR  Burning in the chest Gastroesophageal reflux disease with esophagitis without hemorrhage Take Mylanta now then pick up Rx -     pantoprazole (PROTONIX) 40 MG tablet; Take 1 tablet (40 mg total) by mouth 2 (two) times daily. Take this for two weeks.  Follow up in one week. Plan is gradual slow taper  Hypothyroidism, unspecified type Taking levothyroxine 11mcg daily Reminder to take on an empty stomach 30-34mins before first meal of the day. No antacid medications for 4 hours. -     CBC with Differential/Platelet -     COMPLETE METABOLIC PANEL WITH GFR -     TSH  Tonsillitis Gargle salt water, spit out Warm / cold liquids Continue to monitor  Medication management Continued  Exposure to Epstein-Barr virus -     Epstein-Barr virus VCA antibody panel  Discussed hospital precautions with patient.  Contact office with any new or worsening symptoms.   Further disposition pending results if labs check today. Discussed med's effects and SE's.   Over 30 minutes of face to face interview, exam, counseling, chart review, and critical decision making was performed.    Future Appointments  Date Time Provider [place]  09/06/2020 11:00 AM Unk Pinto, MD [facility] None  11/10/2020 10:00 AM Liane Comber, NP [facility] None    ------------------------------------------------------------------------------------------------------------------   HPI 69 y.o.female presents for evaluation of tightness and burning in her chest and tightening at 0200.  She has lots of pressure and bending over is painful and feels like someone is choking her.   She propped herself up on a pillow.  She reports that she tried to eat some toast this am  and it was burning.  She tried to drink some warm water.  She reports her voice quality has changed. She does have a headache.  She says she is always cold so hard to tell if she is chilled.   Denies any coughing, arm pains, numbness or tingling, shortness of breath or pain with deep breath.  She did not take anything for her symptoms.    She has been on omeprazole in the past for stomach issues but did not stay on it long term.  She reports burping episodes but has not had any of those lately.  She reports this feels different than her typical reflux symptoms.  She had her COVID-19 boooster on Monday.  She has tachycardia in office today.  Past Medical History:  Diagnosis Date  . Anxiety   . Diverticular disease   . Hx of diverticulitis of colon 04/04/2017  . Hyperlipidemia, mixed   . Hypothyroidism 2000  . Vitamin D deficiency      Allergies  Allergen Reactions  . Ciprofloxacin Other (See Comments)    Alleged tendonitis  . Z-Pak [Azithromycin] Other (See Comments)    Patient reports that the reaction was GI upset issues.    Current Outpatient Medications on File Prior to Visit  Medication Sig  . alendronate (FOSAMAX) 70 MG tablet TAKE 1 TABLET BY MOUTH EVERY WEEK IN THE MORNING ON AN EMPTY STOMACH WITH ONLY A GLASS OF WATER; NOTHING BY MOUTH FOR 1 HOUR  . Ascorbic Acid (VITAMIN C) 1000 MG tablet Take 1,000 mg by mouth daily.  Marland Kitchen  aspirin EC 81 MG tablet Take 81 mg by mouth daily.  . Brimonidine Tartrate 0.33 % GEL Apply 1 application topically daily as needed.  . Calcium Citrate (CITRACAL PO) Take 1 tablet by mouth daily.  . Cholecalciferol (VITAMIN D-3) 5000 units TABS Take 1 tablet by mouth daily.  Marland Kitchen dicyclomine (BENTYL) 10 MG capsule Take one tablet up to four times a day as needed.  Take before meals.  Marland Kitchen escitalopram (LEXAPRO) 20 MG tablet Take one tablet daily.  . Flaxseed, Linseed, (FLAXSEED OIL) 1200 MG CAPS Take 1 capsule by mouth daily.  Marland Kitchen levothyroxine (SYNTHROID) 75  MCG tablet Take 1 tablet daily on an empty stomach with only water for 30 minutes & no Antacid meds, Calcium or Magnesium for 4 hours & avoid Biotin  . Omega-3 Fatty Acids (FISH OIL) 1200 MG CAPS Take 1 capsule by mouth daily.  Marland Kitchen OVER THE COUNTER MEDICATION Patient takes "Strenght of the Hill" organic apple cider vinegar with added herbs and spices. 1-2 TBSP daily.  . Probiotic Product (PROBIOTIC-10) CAPS Take 1 capsule by mouth daily.  . rosuvastatin (CRESTOR) 40 MG tablet Take    1 tablet    Daily     for Cholesterol  . TURMERIC PO Take 1,400 mg by mouth daily.   No current facility-administered medications on file prior to visit.    ROS: all negative except above.   Physical Exam:  BP 118/74   Pulse (!) 101   Temp (!) 96.4 F (35.8 C)   SpO2 98%   General Appearance: Well nourished, thin, in no apparent distress. Eyes: PERRLA, EOMs, conjunctiva no swelling or erythema Sinuses: No Frontal/maxillary tenderness ENT/Mouth: Ext aud canals clear, TMs without erythema, bulging. No erythema, swelling, or exudate on post pharynx.  Tonsils not swollen or erythematous. Hearing normal.  Neck: Supple, thyroid normal.  Respiratory: Respiratory effort normal, BS equal bilaterally without rales, rhonchi, wheezing or stridor.  Cardio: Tachycardic, Sinus.  Brisk peripheral pulses without edema.  Abdomen: Soft, + BS.  Non tender, no guarding, rebound, hernias, masses. Lymphatics: Non tender without lymphadenopathy.  Musculoskeletal: Full ROM, 5/5 strength, normal gait.  Skin: Warm, dry without rashes, lesions, ecchymosis.  Neuro: Cranial nerves intact. Normal muscle tone, no cerebellar symptoms. Sensation intact.  Psych: Awake and oriented X 3, normal affect, Insight and Judgment appropriate.     EKG: NS, tachycardia NO ST changes.   [name], [name], DNP [facility] 07/01/2020  12:55 PM

## 2020-07-02 ENCOUNTER — Telehealth: Payer: Self-pay

## 2020-07-02 LAB — COMPLETE METABOLIC PANEL WITH GFR
AG Ratio: 1.6 (calc) (ref 1.0–2.5)
ALT: 19 U/L (ref 6–29)
AST: 22 U/L (ref 10–35)
Albumin: 4.3 g/dL (ref 3.6–5.1)
Alkaline phosphatase (APISO): 52 U/L (ref 37–153)
BUN: 12 mg/dL (ref 7–25)
CO2: 29 mmol/L (ref 20–32)
Calcium: 10 mg/dL (ref 8.6–10.4)
Chloride: 102 mmol/L (ref 98–110)
Creat: 0.58 mg/dL (ref 0.50–0.99)
GFR, Est African American: 111 mL/min/{1.73_m2} (ref >=60)
GFR, Est Non African American: 95 mL/min/{1.73_m2} (ref >=60)
Globulin: 2.7 g/dL (calc) (ref 1.9–3.7)
Glucose, Bld: 91 mg/dL (ref 65–99)
Potassium: 5.3 mmol/L (ref 3.5–5.3)
Sodium: 140 mmol/L (ref 135–146)
Total Bilirubin: 0.7 mg/dL (ref 0.2–1.2)
Total Protein: 7 g/dL (ref 6.1–8.1)

## 2020-07-02 LAB — CBC WITH DIFFERENTIAL/PLATELET
Absolute Monocytes: 496 cells/uL (ref 200–950)
Basophils Absolute: 61 cells/uL (ref 0–200)
Basophils Relative: 0.7 %
Eosinophils Absolute: 26 cells/uL (ref 15–500)
Eosinophils Relative: 0.3 %
HCT: 41.8 % (ref 35.0–45.0)
Hemoglobin: 14.1 g/dL (ref 11.7–15.5)
Lymphs Abs: 1096 cells/uL (ref 850–3900)
MCH: 32.3 pg (ref 27.0–33.0)
MCHC: 33.7 g/dL (ref 32.0–36.0)
MCV: 95.7 fL (ref 80.0–100.0)
MPV: 9.8 fL (ref 7.5–12.5)
Monocytes Relative: 5.7 %
Neutro Abs: 7021 cells/uL (ref 1500–7800)
Neutrophils Relative %: 80.7 %
Platelets: 267 10*3/uL (ref 140–400)
RBC: 4.37 10*6/uL (ref 3.80–5.10)
RDW: 11.5 % (ref 11.0–15.0)
Total Lymphocyte: 12.6 %
WBC: 8.7 10*3/uL (ref 3.8–10.8)

## 2020-07-02 LAB — EPSTEIN-BARR VIRUS VCA ANTIBODY PANEL
EBV NA IgG: 269 U/mL — ABNORMAL HIGH
EBV VCA IgG: >750 U/mL — ABNORMAL HIGH
EBV VCA IgM: <36 U/mL

## 2020-07-02 LAB — TSH: TSH: 0.28 mIU/L — ABNORMAL LOW (ref 0.40–4.50)

## 2020-07-02 NOTE — Telephone Encounter (Signed)
Patient called returning a phone call, pt was given lab results & given information about her THYROID med.

## 2020-07-05 ENCOUNTER — Other Ambulatory Visit: Payer: Self-pay | Admitting: Adult Health Nurse Practitioner

## 2020-07-05 DIAGNOSIS — R0789 Other chest pain: Secondary | ICD-10-CM

## 2020-07-05 DIAGNOSIS — K2100 Gastro-esophageal reflux disease with esophagitis, without bleeding: Secondary | ICD-10-CM

## 2020-07-08 ENCOUNTER — Ambulatory Visit
Admission: RE | Admit: 2020-07-08 | Discharge: 2020-07-08 | Disposition: A | Discharge status: Home / Self Care | Payer: Medicare Other | Source: Ambulatory Visit | Attending: Adult Health Nurse Practitioner | Admitting: Adult Health Nurse Practitioner

## 2020-07-08 DIAGNOSIS — R079 Chest pain, unspecified: Secondary | ICD-10-CM | POA: Diagnosis not present

## 2020-07-08 DIAGNOSIS — R0789 Other chest pain: Secondary | ICD-10-CM

## 2020-07-23 ENCOUNTER — Other Ambulatory Visit: Payer: Self-pay | Admitting: Internal Medicine

## 2020-07-23 DIAGNOSIS — E039 Hypothyroidism, unspecified: Secondary | ICD-10-CM

## 2020-08-10 ENCOUNTER — Encounter: Payer: Medicare Other | Admitting: Internal Medicine

## 2020-08-11 ENCOUNTER — Other Ambulatory Visit: Payer: Self-pay | Admitting: Radiology

## 2020-08-11 DIAGNOSIS — N644 Mastodynia: Secondary | ICD-10-CM

## 2020-08-11 DIAGNOSIS — N6310 Unspecified lump in the right breast, unspecified quadrant: Secondary | ICD-10-CM | POA: Diagnosis not present

## 2020-08-16 ENCOUNTER — Encounter: Payer: Self-pay | Admitting: Internal Medicine

## 2020-08-29 ENCOUNTER — Other Ambulatory Visit: Payer: Self-pay | Admitting: Adult Health Nurse Practitioner

## 2020-08-29 DIAGNOSIS — K2100 Gastro-esophageal reflux disease with esophagitis, without bleeding: Secondary | ICD-10-CM

## 2020-09-06 ENCOUNTER — Encounter: Payer: Medicare Other | Admitting: Internal Medicine

## 2020-09-09 ENCOUNTER — Encounter: Payer: Self-pay | Admitting: Internal Medicine

## 2020-09-09 NOTE — Progress Notes (Addendum)
Comprehensive Evaluation &  Examination      This very nice 70 y.o.  MWF  presents for a Screening /Preventative Visit & comprehensive evaluation and management of multiple medical co-morbidities.  Patient has been followed for labileHTN, HLD, Hypothyroidism, Prediabetes  and Vitamin D Deficiency. Patient has hx/o Dysthymia dx'd since 2003 and has been stable on Lexapro.        Patient has been monitored since 2000 for labile HTN. Patient had her 2sd negative stress Myoview in 2015.   Patient's BP has been controlled at home and patient denies any cardiac symptoms as chest pain, palpitations, shortness of breath, dizziness or ankle swelling. Today's BP is at goal - 120/72.       Patient's hyperlipidemia is controlled with diet /Rosuvastatin. Patient denies myalgias or other medication SE's. Last lipids were at goal:  Lab Results  Component Value Date   CHOL 173 04/16/2020   HDL 71 04/16/2020   LDLCALC 88 04/16/2020   TRIG 53 04/16/2020   CHOLHDL 2.4 04/16/2020       Patient has been monitored expectantly for glucose intolerance and patient denies reactive hypoglycemic symptoms, visual blurring, diabetic polys or paresthesias. A1c's have been normal & at goal:  Lab Results  Component Value Date   HGBA1C 5.3 07/03/2019                                           Patient has been  on Thyroid Replacement since dx'd Hypothyroid in 2000.     Finally, patient has history of Vitamin D Deficiency and last Vitamin D was at goal:  Lab Results  Component Value Date   VD25OH 68 07/03/2019    Current Outpatient Medications on File Prior to Visit  Medication Sig  . alendronate  70 MG tablet TAKE 1 TABLET  EVERY WEEK   . VITAMIN C 1000 MG tablet Take  daily.  Marland Kitchen aspirin EC 81 MG tablet Take  daily.  . Brimonidine Tartrate 0.33 % GEL Apply 1 application topically daily as needed.  Marland Kitchen CITRACAL  Take 1 tablet  daily.  Marland Kitchen VITAMIN D 5000 units  Take 1 tablet daily.  Marland Kitchen dicyclomine 10 MG cap Take  one tablet up to four times a day as needed.    Marland Kitchen escitalopram 20 MG tablet Take one tablet daily.  Marland Kitchen FLAXSEED OIL  1200 MG  Take 1 capsule  daily.  Marland Kitchen levothyroxine  75 MCG tablet Take  1 tablet Daily  . Omega-3 FISH OIL 1200 MG  Take 1 capsule  daily.  . pantoprazole  40 MG tablet Take  1 tablet  2 x /day    . Probiotic CAPS Take 1 capsule daily.  . rosuvastatin  40 MG tablet Take    1 tablet    Daily     for Cholesterol  . TURMERIC 1,400 mg Take  daily.    Allergies  Allergen Reactions  . Ciprofloxacin Alleged tendonitis  . Z-Pak [Azithromycin] GI upset issues.    Past Medical History:  Diagnosis Date  . Anxiety   . Diverticular disease   . Hx of diverticulitis of colon 04/04/2017  . Hyperlipidemia, mixed   . Hypothyroidism 2000  . Vitamin D deficiency    Health Maintenance  Topic Date Due  . PNA vac Low Risk Adult (2 of 2 - PPSV23) 06/07/2019  . MAMMOGRAM  03/18/2020  .  INFLUENZA VACCINE  03/28/2020  . COVID-19 Vaccine (3 - Booster for Pfizer series) 05/01/2020  . TETANUS/TDAP  08/28/2020  . COLONOSCOPY (Pts 45-9yrs Insurance coverage will need to be confirmed)  08/28/2024  . DEXA SCAN  Completed  . Hepatitis C Screening  Completed   Immunization History  Administered Date(s) Administered  . Influenza Inj Mdck Quad With Preservative 07/27/2017  . Influenza, High Dose Seasonal PF 07/16/2018  . PFIZER SARS-COV-2 Vaccination 10/04/2019, 10/30/2019  . PPD Test 04/05/2017  . Pneumococcal Conjugate-13 06/06/2018  . Tdap 08/28/2010    Last Colon - circa 2015   Last MGM -  in spring with Dr Kathryne Eriksson   Past Surgical History:  Procedure Laterality Date  . BREAST BIOPSY Left    benign  . CHOLECYSTECTOMY N/A 02/08/2017   Procedure: LAPAROSCOPIC CHOLECYSTECTOMY WITH INTRAOPERATIVE CHOLANGIOGRAM;  Surgeon: Donnie Mesa, MD;  Location: [facility];  Service: General;  Laterality: N/A;  . EYE SURGERY     Eyelid lifted  . TONSILLECTOMY     Family History  Problem  Relation Age of Onset  . Heart disease Mother   . Heart disease Father   . Breast cancer Sister    Social History   Tobacco Use  . Smoking status: Never Smoker  . Smokeless tobacco: Never Used  Substance Use Topics  . Alcohol use: Yes    Comment: occasional   . Drug use: No    ROS Constitutional: Denies fever, chills, weight loss/gain, headaches, insomnia,  night sweats, and change in appetite. Does c/o fatigue. Eyes: Denies redness, blurred vision, diplopia, discharge, itchy, watery eyes.  ENT: Denies discharge, congestion, post nasal drip, epistaxis, sore throat, earache, hearing loss, dental pain, Tinnitus, Vertigo, Sinus pain, snoring.  Cardio: Denies chest pain, palpitations, irregular heartbeat, syncope, dyspnea, diaphoresis, orthopnea, PND, claudication, edema Respiratory: denies cough, dyspnea, DOE, pleurisy, hoarseness, laryngitis, wheezing.  Gastrointestinal: Denies dysphagia, heartburn, reflux, water brash, pain, cramps, nausea, vomiting, bloating, diarrhea, constipation, hematemesis, melena, hematochezia, jaundice, hemorrhoids Genitourinary: Denies dysuria, frequency, urgency, nocturia, hesitancy, discharge, hematuria, flank pain Breast: Breast lumps, nipple discharge, bleeding.  Musculoskeletal: Denies arthralgia, myalgia, stiffness, Jt. Swelling, pain, limp, and strain/sprain. Denies falls. Skin: Denies puritis, rash, hives, warts, acne, eczema, changing in skin lesion Neuro: No weakness, tremor, incoordination, spasms, paresthesia, pain Psychiatric: Denies confusion, memory loss, sensory loss. Denies Depression. Endocrine: Denies change in weight, skin, hair change, nocturia, and paresthesia, diabetic polys, visual blurring, hyper / hypo glycemic episodes.  Heme/Lymph: No excessive bleeding, bruising, enlarged lymph nodes.  Physical Exam  BP 120/72   Pulse 75   Temp (!) 97.5 F (36.4 C)   Ht 5' 0.5" (1.537 m)   Wt 112 lb 12.8 oz (51.2 kg)   SpO2 98%   BMI 21.67  kg/m   General Appearance: Well nourished, well groomed and in no apparent distress.  Eyes: PERRLA, EOMs, conjunctiva no swelling or erythema, normal fundi and vessels. Sinuses: No frontal/maxillary tenderness ENT/Mouth: EACs patent / TMs  nl. Nares clear without erythema, swelling, mucoid exudates. Oral hygiene is good. No erythema, swelling, or exudate. Tongue normal, non-obstructing. Tonsils not swollen or erythematous. Hearing normal.  Neck: Supple, thyroid not palpable. No bruits, nodes or JVD. Respiratory: Respiratory effort normal.  BS equal and clear bilateral without rales, rhonci, wheezing or stridor. Cardio: Heart sounds are normal with irregular rate and rhythm and no murmurs, rubs or gallops. Peripheral pulses are normal and equal bilaterally without edema. No aortic or femoral bruits. Chest: symmetric with normal excursions and percussion.  Breasts: Deferred to GYN Abdomen: Flat, soft with bowel sounds active. Nontender, no guarding, rebound, hernias, masses, or organomegaly.  Lymphatics: Non tender without lymphadenopathy.  Musculoskeletal: Full ROM all peripheral extremities, joint stability, 5/5 strength, and normal gait. Skin: Warm and dry without rashes, lesions, cyanosis, clubbing or  ecchymosis.  Neuro: Cranial nerves intact, reflexes equal bilaterally. Normal muscle tone, no cerebellar symptoms. Sensation intact.  Pysch: Alert and oriented X 3, normal affect, Insight and Judgment appropriate.   Assessment and Plan    1. Labile hypertension  - EKG 12-Lead - Urinalysis, Routine w reflex microscopic - Microalbumin / creatinine urine ratio - CBC with Differential/Platelet - COMPLETE METABOLIC PANEL WITH GFR - Magnesium - TSH  2. Atrial arrhythmia  - EKG 12-Lead - difficult to determine baseline Afib vs 3:2 block   - Start Xarelto pending Cardiology consultation - RIVAROXABAN (XARELTO) VTE STARTER PACK (15 & 20 MG); Follow package directions: Take one 15mg   tablet by mouth twice a day. On day 22, switch to one 20mg  tablet once a day. Take with food.  Dispense: 51 each; Refill: 0  3. Hyperlipidemia, mixed  - EKG 12-Lead - Lipid panel - TSH  4. Abnormal glucose  - EKG 12-Lead - Hemoglobin A1c - Insulin, random  5. Vitamin D deficiency  - VITAMIN D 25 Hydroxy  6. Hypothyroidism  - TSH  7. Gastroesophageal reflux disease  - CBC with Differential/Platelet  8. Major depressive disorder, in emission ([place])  - TSH  9. Screening for colorectal cancer  - Cologuard  10. Screening for ischemic heart disease  - EKG 12-Lead  11. FHx: heart disease  - EKG 12-Lead  12. Medication management  - Urinalysis, Routine w reflex microscopic - CBC with Differential/Platelet - COMPLETE METABOLIC PANEL WITH GFR - Magnesium - Lipid panel - TSH - Hemoglobin A1c - Insulin, random - VITAMIN D 25 Hydroxy          Patient was counseled in prudent diet to achieve/maintain BMI less than 25 for weight control, BP monitoring, regular exercise and medications. Discussed med's effects and SE's. Screening labs and tests as requested with regular follow-up as recommended. Over 40 minutes of exam, counseling, chart review and high complex critical decision making was performed.   Kirtland Bouchard, MD

## 2020-09-09 NOTE — Patient Instructions (Addendum)
Due to recent changes in healthcare laws, you may see the results of your imaging and laboratory studies on MyChart before your provider has had a chance to review them.  We understand that in some cases there may be results that are confusing or concerning to you. Not all laboratory results come back in the same time frame and the provider may be waiting for multiple results in order to interpret others.  Please give [place] 48 hours in order for your provider to thoroughly review all the results before contacting the office for clarification of your results.   +++++++++++++++++++++++++  Vit D  & Vit C 1,000 mg   are recommended to help protect  against the Covid-19 and other Corona viruses.    Also it's recommended  to take  Zinc 50 mg  to help  protect against the Covid-19   and best place to get  is also on [email or web address]  and don't pay more than 6-8 cents /pill !  ================================ Coronavirus (COVID-19) Are you at risk?  Are you at risk for the Coronavirus (COVID-19)?  To be considered HIGH RISK for Coronavirus (COVID-19), you have to meet the following criteria:  . Traveled to [place], [place], [place], [place] or [place]; or in the [place] to [place], [place], [name]  . or [place]; and have fever, cough, and shortness of breath within the last 2 weeks of travel OR . Been in close contact with a person diagnosed with COVID-19 within the last 2 weeks and have  . fever, cough,and shortness of breath .  . IF YOU DO NOT MEET THESE CRITERIA, YOU ARE CONSIDERED LOW RISK FOR COVID-19.  What to do if you are HIGH RISK for COVID-19?  Marland Kitchen If you are having a medical emergency, call 911. . Seek medical care right away. Before you go to a doctor's office, urgent care or emergency department, .  call ahead and tell them about your recent travel, contact with someone diagnosed with COVID-19  .  and your symptoms.  . You should receive instructions from your physician's  office regarding next steps of care.  . When you arrive at healthcare provider, tell the healthcare staff immediately you have returned from  . visiting [place], [place], [place], [place] or [place]; or traveled in the [place] to [place], [place],  . [place] or [place] in the last two weeks or you have been in close contact with a person diagnosed with  . COVID-19 in the last 2 weeks.   . Tell the health care staff about your symptoms: fever, cough and shortness of breath. . After you have been seen by a medical provider, you will be either: o Tested for (COVID-19) and discharged home on quarantine except to seek medical care if  o symptoms worsen, and asked to  - Stay home and avoid contact with others until you get your results (4-5 days)  - Avoid travel on public transportation if possible (such as bus, train, or airplane) or o Sent to the Emergency Department by EMS for evaluation, COVID-19 testing  and  o possible admission depending on your condition and test results.  What to do if you are LOW RISK for COVID-19?  Reduce your risk of any infection by using the same precautions used for avoiding the common cold or flu:  Marland Kitchen Wash your hands often with soap and warm water for at least 20 seconds.  If soap and water are not readily  available,  . use an alcohol-based hand sanitizer with at least 60% alcohol.  . If coughing or sneezing, cover your mouth and nose by coughing or sneezing into the elbow areas of your shirt or coat, .  into a tissue or into your sleeve (not your hands). . Avoid shaking hands with others and consider head nods or verbal greetings only. . Avoid touching your eyes, nose, or mouth with unwashed hands.  . Avoid close contact with people who are sick. . Avoid places or events with large numbers of people in one location, like concerts or sporting events. . Carefully consider travel plans you have or are making. . If you are planning any travel outside or  inside the [place], visit the CDC's Travelers' Health webpage for the latest health notices. . If you have some symptoms but not all symptoms, continue to monitor at home and seek medical attention  . if your symptoms worsen. . If you are having a medical emergency, call 911.   . >>>>>>>>>>>>>>>>>>>>>>>>>>>>>>>>> . We Do NOT Approve of  [facility], [employer]  Our Patients  To Do Home Visits & We Do NOT Approve of [employer] > > > > > > > > > > > > > > > > > > > > > > > > > > > > > > > > > > > > > > >  Preventive Care for Adults  A healthy lifestyle and preventive care can promote health and wellness. Preventive health guidelines for women include the following key practices.  A routine yearly physical is a good way to check with your health care provider about your health and preventive screening. It is a chance to share any concerns and updates on your health and to receive a thorough exam.  Visit your dentist for a routine exam and preventive care every 6 months. Brush your teeth twice a day and floss once a day. Good oral hygiene prevents tooth decay and gum disease.  The frequency of eye exams is based on your age, health, family medical history, use of contact lenses, and other factors. Follow your health care provider's recommendations for frequency of eye exams.  Eat a healthy diet. Foods like vegetables, fruits, whole grains, low-fat dairy products, and lean protein foods contain the nutrients you need without too many calories. Decrease your intake of foods high in solid fats, added sugars, and salt. Eat the right amount of calories for you. Get information about a proper diet from your health care provider, if necessary.  Regular physical exercise is one of the most important things you can do for your health. Most adults should get at least 150 minutes of moderate-intensity exercise (any activity that increases your heart rate and causes you to sweat) each  week. In addition, most adults need muscle-strengthening exercises on 2 or more days a week.  Maintain a healthy weight. The body mass index (BMI) is a screening tool to identify possible weight problems. It provides an estimate of body fat based on height and weight. Your health care provider can find your BMI and can help you achieve or maintain a healthy weight. For adults 20 years and older:  A BMI below 18.5 is considered underweight.  A BMI of 18.5 to 24.9 is normal.  A BMI of 25 to 29.9 is considered overweight.  A BMI of 30 and above is considered obese.  Maintain normal blood lipids and cholesterol levels by exercising and minimizing your intake of  saturated fat. Eat a balanced diet with plenty of fruit and vegetables. If your lipid or cholesterol levels are high, you are over 50, or you are at high risk for heart disease, you may need your cholesterol levels checked more frequently. Ongoing high lipid and cholesterol levels should be treated with medicines if diet and exercise are not working.  If you smoke, find out from your health care provider how to quit. If you do not use tobacco, do not start.  Lung cancer screening is recommended for adults aged 78-80 years who are at high risk for developing lung cancer because of a history of smoking. A yearly low-dose CT scan of the lungs is recommended for people who have at least a 30-pack-year history of smoking and are a current smoker or have quit within the past 15 years. A pack year of smoking is smoking an average of 1 pack of cigarettes a day for 1 year (for example: 1 pack a day for 30 years or 2 packs a day for 15 years). Yearly screening should continue until the smoker has stopped smoking for at least 15 years. Yearly screening should be stopped for people who develop a health problem that would prevent them from having lung cancer treatment.  Avoid use of street drugs. Do not share needles with anyone. Ask for help if you need  support or instructions about stopping the use of drugs.  High blood pressure causes heart disease and increases the risk of stroke.  Ongoing high blood pressure should be treated with medicines if weight loss and exercise do not work.  If you are 14-45 years old, ask your health care provider if you should take aspirin to prevent strokes.  Diabetes screening involves taking a blood sample to check your fasting blood sugar level. This should be done once every 3 years, after age 40, if you are within normal weight and without risk factors for diabetes. Testing should be considered at a younger age or be carried out more frequently if you are overweight and have at least 1 risk factor for diabetes.  Breast cancer screening is essential preventive care for women. You should practice "breast self-awareness." This means understanding the normal appearance and feel of your breasts and may include breast self-examination. Any changes detected, no matter how small, should be reported to a health care provider. Women in their 18s and 30s should have a clinical breast exam (CBE) by a health care provider as part of a regular health exam every 1 to 3 years. After age 71, women should have a CBE every year. Starting at age 39, women should consider having a mammogram (breast X-ray test) every year. Women who have a family history of breast cancer should talk to their health care provider about genetic screening. Women at a high risk of breast cancer should talk to their health care providers about having an MRI and a mammogram every year.  Breast cancer gene (BRCA)-related cancer risk assessment is recommended for women who have family members with BRCA-related cancers. BRCA-related cancers include breast, ovarian, tubal, and peritoneal cancers. Having family members with these cancers may be associated with an increased risk for harmful changes (mutations) in the breast cancer genes BRCA1 and BRCA2. Results of the  assessment will determine the need for genetic counseling and BRCA1 and BRCA2 testing.  Routine pelvic exams to screen for cancer are no longer recommended for nonpregnant women who are considered low risk for cancer of the pelvic organs (ovaries,  uterus, and vagina) and who do not have symptoms. Ask your health care provider if a screening pelvic exam is right for you.  If you have had past treatment for cervical cancer or a condition that could lead to cancer, you need Pap tests and screening for cancer for at least 20 years after your treatment. If Pap tests have been discontinued, your risk factors (such as having a new sexual partner) need to be reassessed to determine if screening should be resumed. Some women have medical problems that increase the chance of getting cervical cancer. In these cases, your health care provider may recommend more frequent screening and Pap tests.    Colorectal cancer can be detected and often prevented. Most routine colorectal cancer screening begins at the age of 62 years and continues through age 28 years. However, your health care provider may recommend screening at an earlier age if you have risk factors for colon cancer. On a yearly basis, your health care provider may provide home test kits to check for hidden blood in the stool. Use of a small camera at the end of a tube, to directly examine the colon (sigmoidoscopy or colonoscopy), can detect the earliest forms of colorectal cancer. Talk to your health care provider about this at age 23, when routine screening begins.  Direct exam of the colon should be repeated every 5-10 years through age 31 years, unless early forms of pre-cancerous polyps or small growths are found.  Osteoporosis is a disease in which the bones lose minerals and strength with aging. This can result in serious bone fractures or breaks. The risk of osteoporosis can be identified using a bone density scan. Women ages 83 years and over and women  at risk for fractures or osteoporosis should discuss screening with their health care providers. Ask your health care provider whether you should take a calcium supplement or vitamin D to reduce the rate of osteoporosis.  Menopause can be associated with physical symptoms and risks. Hormone replacement therapy is available to decrease symptoms and risks. You should talk to your health care provider about whether hormone replacement therapy is right for you.  Use sunscreen. Apply sunscreen liberally and repeatedly throughout the day. You should seek shade when your shadow is shorter than you. Protect yourself by wearing long sleeves, pants, a wide-brimmed hat, and sunglasses year round, whenever you are outdoors.  Once a month, do a whole body skin exam, using a mirror to look at the skin on your back. Tell your health care provider of new moles, moles that have irregular borders, moles that are larger than a pencil eraser, or moles that have changed in shape or color.  Stay current with required vaccines (immunizations).  Influenza vaccine. All adults should be immunized every year.  Tetanus, diphtheria, and acellular pertussis (Td, Tdap) vaccine. Pregnant women should receive 1 dose of Tdap vaccine during each pregnancy. The dose should be obtained regardless of the length of time since the last dose. Immunization is preferred during the 27th-36th week of gestation. An adult who has not previously received Tdap or who does not know her vaccine status should receive 1 dose of Tdap. This initial dose should be followed by tetanus and diphtheria toxoids (Td) booster doses every 10 years. Adults with an unknown or incomplete history of completing a 3-dose immunization series with Td-containing vaccines should begin or complete a primary immunization series including a Tdap dose. Adults should receive a Td booster every 10 years.  Zoster vaccine. One dose is recommended for adults aged 64 years or  older unless certain conditions are present.    Pneumococcal 13-valent conjugate (PCV13) vaccine. When indicated, a person who is uncertain of her immunization history and has no record of immunization should receive the PCV13 vaccine. An adult aged 61 years or older who has certain medical conditions and has not been previously immunized should receive 1 dose of PCV13 vaccine. This PCV13 should be followed with a dose of pneumococcal polysaccharide (PPSV23) vaccine. The PPSV23 vaccine dose should be obtained at least 1 or more year(s) after the dose of PCV13 vaccine. An adult aged 46 years or older who has certain medical conditions and previously received 1 or more doses of PPSV23 vaccine should receive 1 dose of PCV13. The PCV13 vaccine dose should be obtained 1 or more years after the last PPSV23 vaccine dose.    Pneumococcal polysaccharide (PPSV23) vaccine. When PCV13 is also indicated, PCV13 should be obtained first. All adults aged 58 years and older should be immunized. An adult younger than age 1 years who has certain medical conditions should be immunized. Any person who resides in a nursing home or long-term care facility should be immunized. An adult smoker should be immunized. People with an immunocompromised condition and certain other conditions should receive both PCV13 and PPSV23 vaccines. People with human immunodeficiency virus (HIV) infection should be immunized as soon as possible after diagnosis. Immunization during chemotherapy or radiation therapy should be avoided. Routine use of PPSV23 vaccine is not recommended for American Indians, [place] Natives, or people younger than 65 years unless there are medical conditions that require PPSV23 vaccine. When indicated, people who have unknown immunization and have no record of immunization should receive PPSV23 vaccine. One-time revaccination 5 years after the first dose of PPSV23 is recommended for people aged 19-64 years who have chronic  kidney failure, nephrotic syndrome, asplenia, or immunocompromised conditions. People who received 1-2 doses of PPSV23 before age 11 years should receive another dose of PPSV23 vaccine at age 70 years or later if at least 5 years have passed since the previous dose. Doses of PPSV23 are not needed for people immunized with PPSV23 at or after age 65 years.   Preventive Services / Frequency  Ages 56 years and over  Blood pressure check.  Lipid and cholesterol check.  Lung cancer screening. / Every year if you are aged 16-80 years and have a 30-pack-year history of smoking and currently smoke or have quit within the past 15 years. Yearly screening is stopped once you have quit smoking for at least 15 years or develop a health problem that would prevent you from having lung cancer treatment.  Clinical breast exam.** / Every year after age 59 years.   BRCA-related cancer risk assessment.** / For women who have family members with a BRCA-related cancer (breast, ovarian, tubal, or peritoneal cancers).  Mammogram.** / Every year beginning at age 70 years and continuing for as long as you are in good health. Consult with your health care provider.  Pap test.** / Every 3 years starting at age 32 years through age 28 or 9 years with 3 consecutive normal Pap tests. Testing can be stopped between 65 and 70 years with 3 consecutive normal Pap tests and no abnormal Pap or HPV tests in the past 10 years.  Fecal occult blood test (FOBT) of stool. / Every year beginning at age 57 years and continuing until age 29 years. You may not need to  do this test if you get a colonoscopy every 10 years.  Flexible sigmoidoscopy or colonoscopy.** / Every 5 years for a flexible sigmoidoscopy or every 10 years for a colonoscopy beginning at age 21 years and continuing until age 56 years.  Hepatitis C blood test.** / For all people born from [birth year] through 1963 and any individual with known risks for hepatitis  C.  Osteoporosis screening.** / A one-time screening for women ages 53 years and over and women at risk for fractures or osteoporosis.  Skin self-exam. / Monthly.  Influenza vaccine. / Every year.  Tetanus, diphtheria, and acellular pertussis (Tdap/Td) vaccine.** / 1 dose of Td every 10 years.  Zoster vaccine.** / 1 dose for adults aged 32 years or older.  Pneumococcal 13-valent conjugate (PCV13) vaccine.** / Consult your health care provider.  Pneumococcal polysaccharide (PPSV23) vaccine.** / 1 dose for all adults aged 67 years and older. Screening for abdominal aortic aneurysm (AAA)  by ultrasound is recommended for people who have history of high blood pressure or who are current or former smokers. ++++++++++++++++++++ Recommend Adult Low Dose Aspirin or  coated  Aspirin 81 mg daily  To reduce risk of Colon Cancer 40 %,  Skin Cancer 26 % ,  Melanoma 46%  and  Pancreatic cancer 60% ++++++++++++++++++++ Vitamin D goal  is between 70-100.  Please make sure that you are taking your Vitamin D as directed.  It is very important as a natural anti-inflammatory  helping hair, skin, and nails, as well as reducing stroke and heart attack risk.  It helps your bones and helps with mood. It also decreases numerous cancer risks so please take it as directed.  Low Vit D is associated with a 200-300% higher risk for CANCER  and 200-300% higher risk for HEART   ATTACK  &  STROKE.   .....................................Marland Kitchen It is also associated with higher death rate at younger ages,  autoimmune diseases like Rheumatoid arthritis, Lupus, Multiple Sclerosis.    Also many other serious conditions, like depression, Alzheimer's Dementia, infertility, muscle aches, fatigue, fibromyalgia - just to name a few. ++++++++++++++++++ Recommend the book "The END of DIETING" by Dr Excell Seltzer  & the book "The END of DIABETES " by Dr Excell Seltzer At [email or web address] - get book & Audio CD's    Being diabetic has  a  300% increased risk for heart attack, stroke, cancer, and alzheimer- type vascular dementia. It is very important that you work harder with diet by avoiding all foods that are white. Avoid white rice (brown & wild rice is OK), white potatoes (sweetpotatoes in moderation is OK), White bread or wheat bread or anything made out of white flour like bagels, donuts, rolls, buns, biscuits, cakes, pastries, cookies, pizza crust, and pasta (made from white flour & egg whites) - vegetarian pasta or spinach or wheat pasta is OK. Multigrain breads like Arnold's or [employer], or multigrain sandwich thins or flatbreads.  Diet, exercise and weight loss can reverse and cure diabetes in the early stages.  Diet, exercise and weight loss is very important in the control and prevention of complications of diabetes which affects every system in your body, ie. Brain - dementia/stroke, eyes - glaucoma/blindness, heart - heart attack/heart failure, kidneys - dialysis, stomach - gastric paralysis, intestines - malabsorption, nerves - severe painful neuritis, circulation - gangrene & loss of a leg(s), and finally cancer and Alzheimers.    I recommend avoid fried & greasy foods,  sweets/candy, white rice (brown or wild  rice or Quinoa is OK), white potatoes (sweet potatoes are OK) - anything made from white flour - bagels, doughnuts, rolls, buns, biscuits,white and wheat breads, pizza crust and traditional pasta made of white flour & egg white(vegetarian pasta or spinach or wheat pasta is OK).  Multi-grain bread is OK - like multi-grain flat bread or sandwich thins. Avoid alcohol in excess. Exercise is also important.    Eat all the vegetables you want - avoid meat, especially red meat and dairy - especially cheese.  Cheese is the most concentrated form of trans-fats which is the worst thing to clog up our arteries. Veggie cheese is OK which can be found in the fresh produce section at [employer] or [employer] or  [employer]  +++++++++++++++++++ DASH Eating Plan  DASH stands for "Dietary Approaches to Stop Hypertension."   The DASH eating plan is a healthy eating plan that has been shown to reduce high blood pressure (hypertension). Additional health benefits may include reducing the risk of type 2 diabetes mellitus, heart disease, and stroke. The DASH eating plan may also help with weight loss. WHAT DO I NEED TO KNOW ABOUT THE DASH EATING PLAN? For the DASH eating plan, you will follow these general guidelines:  Choose foods with a percent daily value for sodium of less than 5% (as listed on the food label).  Use salt-free seasonings or herbs instead of table salt or sea salt.  Check with your health care provider or pharmacist before using salt substitutes.  Eat lower-sodium products, often labeled as "lower sodium" or "no salt added."  Eat fresh foods.  Eat more vegetables, fruits, and low-fat dairy products.  Choose whole grains. Look for the word "whole" as the first word in the ingredient list.  Choose fish   Limit sweets, desserts, sugars, and sugary drinks.  Choose heart-healthy fats.  Eat veggie cheese   Eat more home-cooked food and less restaurant, buffet, and fast food.  Limit fried foods.  Cook foods using methods other than frying.  Limit canned vegetables. If you do use them, rinse them well to decrease the sodium.  When eating at a restaurant, ask that your food be prepared with less salt, or no salt if possible.                      WHAT FOODS CAN I EAT? Read Dr Fara Olden Fuhrman's books on The End of Dieting & The End of Diabetes  Grains Whole grain or whole wheat bread. Brown rice. Whole grain or whole wheat pasta. Quinoa, bulgur, and whole grain cereals. Low-sodium cereals. Corn or whole wheat flour tortillas. Whole grain cornbread. Whole grain crackers. Low-sodium crackers.  Vegetables Fresh or frozen vegetables (raw, steamed, roasted, or grilled). Low-sodium or  reduced-sodium tomato and vegetable juices. Low-sodium or reduced-sodium tomato sauce and paste. Low-sodium or reduced-sodium canned vegetables.   Fruits All fresh, canned (in natural juice), or frozen fruits.  Protein Products  All fish and seafood.  Dried beans, peas, or lentils. Unsalted nuts and seeds. Unsalted canned beans.  Dairy Low-fat dairy products, such as skim or 1% milk, 2% or reduced-fat cheeses, low-fat ricotta or cottage cheese, or plain low-fat yogurt. Low-sodium or reduced-sodium cheeses.  Fats and Oils Tub margarines without trans fats. Light or reduced-fat mayonnaise and salad dressings (reduced sodium). Avocado. Safflower, olive, or canola oils. Natural peanut or almond butter.  Other Unsalted popcorn and pretzels. The items listed above may not be a complete list of recommended foods  or beverages. Contact your dietitian for more options.  +++++++++++++++  WHAT FOODS ARE NOT RECOMMENDED? Grains/ White flour or wheat flour White bread. White pasta. White rice. Refined cornbread. Bagels and croissants. Crackers that contain trans fat.  Vegetables  Creamed or fried vegetables. Vegetables in a . Regular canned vegetables. Regular canned tomato sauce and paste. Regular tomato and vegetable juices.  Fruits Dried fruits. Canned fruit in light or heavy syrup. Fruit juice.  Meat and Other Protein Products Meat in general - RED meat & White meat.  Fatty cuts of meat. Ribs, chicken wings, all processed meats as bacon, sausage, bologna, salami, fatback, hot dogs, bratwurst and packaged luncheon meats.  Dairy Whole or 2% milk, cream, half-and-half, and cream cheese. Whole-fat or sweetened yogurt. Full-fat cheeses or blue cheese. Non-dairy creamers and whipped toppings. Processed cheese, cheese spreads, or cheese curds.  Condiments Onion and garlic salt, seasoned salt, table salt, and sea salt. Canned and packaged gravies. Worcestershire sauce. Tartar sauce. Barbecue  sauce. Teriyaki sauce. Soy sauce, including reduced sodium. Steak sauce. Fish sauce. Oyster sauce. Cocktail sauce. Horseradish. Ketchup and mustard. Meat flavorings and tenderizers. Bouillon cubes. Hot sauce. Tabasco sauce. Marinades. Taco seasonings. Relishes.  Fats and Oils Butter, stick margarine, lard, shortening and bacon fat. Coconut, palm kernel, or palm oils. Regular salad dressings.  Pickles and olives. Salted popcorn and pretzels.  The items listed above may not be a complete list of foods and beverages to avoid.   ++++++++++++++++++++++++++++++++++   Atrial Fibrillation  Atrial fibrillation is a type of irregular or rapid heartbeat (arrhythmia). In atrial fibrillation, the top part of the heart (atria) beats in an irregular pattern. This makes the heart unable to pump blood normally and effectively. The goal of treatment is to prevent blood clots from forming, control your heart rate, or restore your heartbeat to a normal rhythm. If this condition is not treated, it can cause serious problems, such as a weakened heart muscle (cardiomyopathy) or a stroke. What are the causes? This condition is often caused by medical conditions that damage the heart's electrical system. These include:  High blood pressure (hypertension). This is the most common cause.  Certain heart problems or conditions, such as heart failure, coronary artery disease, heart valve problems, or heart surgery.  Diabetes.  Overactive thyroid (hyperthyroidism).  Obesity.  Chronic kidney disease. In some cases, the cause of this condition is not known. What increases the risk? This condition is more likely to develop in:  Older people.  People who smoke.  Athletes who do endurance exercise.  People who have a family history of atrial fibrillation.  Men.  People who use drugs.  People who drink a lot of alcohol.  People who have lung conditions, such as emphysema, pneumonia, or COPD.  People  who have obstructive sleep apnea. What are the signs or symptoms? Symptoms of this condition include:  A feeling that your heart is racing or beating irregularly.  Discomfort or pain in your chest.  Shortness of breath.  Sudden light-headedness or weakness.  Tiring easily during exercise or activity.  Fatigue.  Syncope (fainting).  Sweating. In some cases, there are no symptoms. How is this diagnosed? Your health care provider may detect atrial fibrillation when taking your pulse. If detected, this condition may be diagnosed with:  An electrocardiogram (ECG) to check electrical signals of the heart.  An ambulatory cardiac monitor to record your heart's activity for a few days.  A transthoracic echocardiogram (TTE) to create pictures of your  heart.  A transesophageal echocardiogram (TEE) to create even closer pictures of your heart.  A stress test to check your blood supply while you exercise.  Imaging tests, such as a CT scan or chest X-ray.  Blood tests. How is this treated? Treatment depends on underlying conditions and how you feel when you experience atrial fibrillation. This condition may be treated with:  Medicines to prevent blood clots or to treat heart rate or heart rhythm problems.  Electrical cardioversion to reset the heart's rhythm.  A pacemaker to correct abnormal heart rhythm.  Ablation to remove the heart tissue that sends abnormal signals.  Left atrial appendage closure to seal the area where blood clots can form. In some cases, underlying conditions will be treated. Follow these instructions at home: Medicines  Take over-the counter and prescription medicines only as told by your health care provider.  Do not take any new medicines without talking to your health care provider.  If you are taking blood thinners: ? Talk with your health care provider before you take any medicines that contain aspirin or NSAIDs, such as ibuprofen. These  medicines increase your risk for dangerous bleeding. ? Take your medicine exactly as told, at the same time every day. ? Avoid activities that could cause injury or bruising, and follow instructions about how to prevent falls. ? Wear a medical alert bracelet or carry a card that lists what medicines you take. Lifestyle  Do not use any products that contain nicotine or tobacco, such as cigarettes, e-cigarettes, and chewing tobacco. If you need help quitting, ask your health care provider.  Eat heart-healthy foods. Talk with a dietitian to make an eating plan that is right for you.  Exercise regularly as told by your health care provider.  Do not drink alcohol.  Lose weight if you are overweight.  Do not use drugs, including cannabis.      General instructions  If you have obstructive sleep apnea, manage your condition as told by your health care provider.  Do not use diet pills unless your health care provider approves. Diet pills can make heart problems worse.  Keep all follow-up visits as told by your health care provider. This is important. Contact a health care provider if you:  Notice a change in the rate, rhythm, or strength of your heartbeat.  Are taking a blood thinner and you notice more bruising.  Tire more easily when you exercise or do heavy work.  Have a sudden change in weight. Get help right away if you have:  Chest pain, abdominal pain, sweating, or weakness.  Trouble breathing.  Side effects of blood thinners, such as blood in your vomit, stool, or urine, or bleeding that cannot stop.  Any symptoms of a stroke. "BE FAST" is an easy way to remember the main warning signs of a stroke: ? B - Balance. Signs are dizziness, sudden trouble walking, or loss of balance. ? E - Eyes. Signs are trouble seeing or a sudden change in vision. ? F - Face. Signs are sudden weakness or numbness of the face, or the face or eyelid drooping on one side. ? A - Arms. Signs are  weakness or numbness in an arm. This happens suddenly and usually on one side of the body. ? S - Speech. Signs are sudden trouble speaking, slurred speech, or trouble understanding what people say. ? T - Time. Time to call emergency services. Write down what time symptoms started.  Other signs of a stroke, such  as: ? A sudden, severe headache with no known cause. ? Nausea or vomiting. ? Seizure. These symptoms may represent a serious problem that is an emergency. Do not wait to see if the symptoms will go away. Get medical help right away. Call your local emergency services (911 in the [place] Do not drive yourself to the hospital.   Summary  Atrial fibrillation is a type of irregular or rapid heartbeat (arrhythmia).  Symptoms include a feeling that your heart is beating fast or irregularly.  You may be given medicines to prevent blood clots or to treat heart rate or heart rhythm problems.  Get help right away if you have signs or symptoms of a stroke.  Get help right away if you cannot catch your breath or have chest pain or pressure. This information is not intended to replace advice given to you by your health care provider. Make sure you discuss any questions you have with your health care provider. Document Revised: 02/05/2019 Document Reviewed: 02/05/2019 Elsevier Patient Education  [place].

## 2020-09-10 ENCOUNTER — Ambulatory Visit (INDEPENDENT_AMBULATORY_CARE_PROVIDER_SITE_OTHER): Payer: Medicare Other | Admitting: Internal Medicine

## 2020-09-10 ENCOUNTER — Encounter: Payer: Self-pay | Admitting: Internal Medicine

## 2020-09-10 ENCOUNTER — Other Ambulatory Visit: Payer: Self-pay

## 2020-09-10 VITALS — BP 120/72 | HR 75 | Temp 97.5°F | Ht 60.5 in | Wt 112.8 lb

## 2020-09-10 DIAGNOSIS — Z79899 Other long term (current) drug therapy: Secondary | ICD-10-CM | POA: Diagnosis not present

## 2020-09-10 DIAGNOSIS — I498 Other specified cardiac arrhythmias: Secondary | ICD-10-CM | POA: Diagnosis not present

## 2020-09-10 DIAGNOSIS — F3341 Major depressive disorder, recurrent, in partial remission: Secondary | ICD-10-CM

## 2020-09-10 DIAGNOSIS — E039 Hypothyroidism, unspecified: Secondary | ICD-10-CM | POA: Diagnosis not present

## 2020-09-10 DIAGNOSIS — Z1211 Encounter for screening for malignant neoplasm of colon: Secondary | ICD-10-CM

## 2020-09-10 DIAGNOSIS — E559 Vitamin D deficiency, unspecified: Secondary | ICD-10-CM | POA: Diagnosis not present

## 2020-09-10 DIAGNOSIS — Z136 Encounter for screening for cardiovascular disorders: Secondary | ICD-10-CM | POA: Diagnosis not present

## 2020-09-10 DIAGNOSIS — E782 Mixed hyperlipidemia: Secondary | ICD-10-CM

## 2020-09-10 DIAGNOSIS — R0989 Other specified symptoms and signs involving the circulatory and respiratory systems: Secondary | ICD-10-CM | POA: Diagnosis not present

## 2020-09-10 DIAGNOSIS — K2100 Gastro-esophageal reflux disease with esophagitis, without bleeding: Secondary | ICD-10-CM

## 2020-09-10 DIAGNOSIS — R7309 Other abnormal glucose: Secondary | ICD-10-CM | POA: Diagnosis not present

## 2020-09-10 DIAGNOSIS — Z1212 Encounter for screening for malignant neoplasm of rectum: Secondary | ICD-10-CM

## 2020-09-10 DIAGNOSIS — I4891 Unspecified atrial fibrillation: Secondary | ICD-10-CM

## 2020-09-10 DIAGNOSIS — Z8249 Family history of ischemic heart disease and other diseases of the circulatory system: Secondary | ICD-10-CM

## 2020-09-10 MED ORDER — RIVAROXABAN (XARELTO) VTE STARTER PACK (15 & 20 MG): ORAL_TABLET | ORAL | 0 refills | Status: DC

## 2020-09-12 NOTE — Progress Notes (Signed)
========================================================== -   Test results slightly outside the reference range are not unusual. If there is anything important, I will review this with you,  otherwise it is considered normal test values.  If you have further questions,  please do not hesitate to contact me at the office or via My Chart.  ========================================================== ==========================================================  -   Magnesium  -   1.8   -  very  low- goal is betw 2.0 - 2.5,   - So..............Marland Kitchen  Recommend that you take  Magnesium 500 mg tablet daily   - also important to eat lots of  leafy green vegetables   - spinach - Kale - collards - greens - okra - asparagus  - broccoli - quinoa - squash - almonds   - black, red, white beans  -  peas - green beans ========================================================== ==========================================================  -  Total Chol = 182    and     LDL Chol = 88  -   Both  Excellent   - Very low risk for Heart Attack  / Stroke ========================================================== ==========================================================   -  Thyroid - Normal  ========================================================== ==========================================================  -  A1c - Normal - Great  - No Diabetes ! ========================================================== ==========================================================  -  Vitamin D = 75 - Excellent  ========================================================== ==========================================================  -  All Else - CBC - Kidneys - Electrolytes - Liver - Magnesium & Thyroid    - all  Normal / OK ========================================================== ==========================================================  - Keep up the [place] Work   !  ========================================================== ==========================================================

## 2020-09-13 LAB — COMPLETE METABOLIC PANEL WITH GFR
AG Ratio: 1.8 (calc) (ref 1.0–2.5)
ALT: 16 U/L (ref 6–29)
AST: 18 U/L (ref 10–35)
Albumin: 4.2 g/dL (ref 3.6–5.1)
Alkaline phosphatase (APISO): 45 U/L (ref 37–153)
BUN: 19 mg/dL (ref 7–25)
CO2: 30 mmol/L (ref 20–32)
Calcium: 9.4 mg/dL (ref 8.6–10.4)
Chloride: 102 mmol/L (ref 98–110)
Creat: 0.7 mg/dL (ref 0.50–0.99)
GFR, Est African American: 104 mL/min/{1.73_m2} (ref >=60)
GFR, Est Non African American: 90 mL/min/{1.73_m2} (ref >=60)
Globulin: 2.4 g/dL (calc) (ref 1.9–3.7)
Glucose, Bld: 96 mg/dL (ref 65–99)
Potassium: 5.3 mmol/L (ref 3.5–5.3)
Sodium: 137 mmol/L (ref 135–146)
Total Bilirubin: 0.6 mg/dL (ref 0.2–1.2)
Total Protein: 6.6 g/dL (ref 6.1–8.1)

## 2020-09-13 LAB — URINALYSIS, ROUTINE W REFLEX MICROSCOPIC
Bilirubin Urine: NEGATIVE
Glucose, UA: NEGATIVE
Hgb urine dipstick: NEGATIVE
Ketones, ur: NEGATIVE
Leukocytes,Ua: NEGATIVE
Nitrite: NEGATIVE
Protein, ur: NEGATIVE
Specific Gravity, Urine: 1.016 (ref 1.001–1.03)
pH: 6 (ref 5.0–8.0)

## 2020-09-13 LAB — CBC WITH DIFFERENTIAL/PLATELET
Absolute Monocytes: 308 cells/uL (ref 200–950)
Basophils Absolute: 51 cells/uL (ref 0–200)
Basophils Relative: 1.1 %
Eosinophils Absolute: 28 cells/uL (ref 15–500)
Eosinophils Relative: 0.6 %
HCT: 38.5 % (ref 35.0–45.0)
Hemoglobin: 13.1 g/dL (ref 11.7–15.5)
Lymphs Abs: 1472 cells/uL (ref 850–3900)
MCH: 32 pg (ref 27.0–33.0)
MCHC: 34 g/dL (ref 32.0–36.0)
MCV: 94.1 fL (ref 80.0–100.0)
MPV: 9.9 fL (ref 7.5–12.5)
Monocytes Relative: 6.7 %
Neutro Abs: 2742 cells/uL (ref 1500–7800)
Neutrophils Relative %: 59.6 %
Platelets: 267 10*3/uL (ref 140–400)
RBC: 4.09 10*6/uL (ref 3.80–5.10)
RDW: 11.7 % (ref 11.0–15.0)
Total Lymphocyte: 32 %
WBC: 4.6 10*3/uL (ref 3.8–10.8)

## 2020-09-13 LAB — MAGNESIUM: Magnesium: 1.8 mg/dL (ref 1.5–2.5)

## 2020-09-13 LAB — MICROALBUMIN / CREATININE URINE RATIO
Creatinine, Urine: 88 mg/dL (ref 20–275)
Microalb Creat Ratio: 5 mcg/mg creat (ref <30)
Microalb, Ur: 0.4 mg/dL

## 2020-09-13 LAB — VITAMIN D 25 HYDROXY (VIT D DEFICIENCY, FRACTURES): Vit D, 25-Hydroxy: 75 ng/mL (ref 30–100)

## 2020-09-13 LAB — LIPID PANEL
Cholesterol: 172 mg/dL (ref <200)
HDL: 68 mg/dL (ref >=50)
LDL Cholesterol (Calc): 88 mg/dL (calc)
Non-HDL Cholesterol (Calc): 104 mg/dL (calc) (ref <130)
Total CHOL/HDL Ratio: 2.5 (calc) (ref <5.0)
Triglycerides: 70 mg/dL (ref <150)

## 2020-09-13 LAB — HEMOGLOBIN A1C
Hgb A1c MFr Bld: 5.4 % of total Hgb (ref <5.7)
Mean Plasma Glucose: 108 mg/dL
eAG (mmol/L): 6 mmol/L

## 2020-09-13 LAB — TSH: TSH: 1.73 mIU/L (ref 0.40–4.50)

## 2020-09-13 LAB — INSULIN, RANDOM: Insulin: 32.8 u[IU]/mL — ABNORMAL HIGH

## 2020-09-15 NOTE — Addendum Note (Signed)
Addended by: Unk Pinto on: 09/15/2020 09:50 PM   Modules accepted: Orders

## 2020-09-22 ENCOUNTER — Ambulatory Visit: Payer: Medicare Other | Admitting: Internal Medicine

## 2020-09-22 ENCOUNTER — Ambulatory Visit (INDEPENDENT_AMBULATORY_CARE_PROVIDER_SITE_OTHER): Payer: Medicare Other | Admitting: Internal Medicine

## 2020-09-22 ENCOUNTER — Ambulatory Visit (INDEPENDENT_AMBULATORY_CARE_PROVIDER_SITE_OTHER): Payer: Medicare Other

## 2020-09-22 ENCOUNTER — Encounter: Payer: Self-pay | Admitting: Internal Medicine

## 2020-09-22 ENCOUNTER — Ambulatory Visit
Admission: RE | Admit: 2020-09-22 | Discharge: 2020-09-22 | Disposition: A | Discharge status: Home / Self Care | Payer: Medicare Other | Source: Ambulatory Visit | Attending: Radiology | Admitting: Radiology

## 2020-09-22 ENCOUNTER — Encounter: Payer: Self-pay | Admitting: *Deleted

## 2020-09-22 ENCOUNTER — Other Ambulatory Visit: Payer: Self-pay

## 2020-09-22 VITALS — BP 104/80 | HR 66 | Ht 60.5 in | Wt 113.8 lb

## 2020-09-22 DIAGNOSIS — R9431 Abnormal electrocardiogram [ECG] [EKG]: Secondary | ICD-10-CM

## 2020-09-22 DIAGNOSIS — N644 Mastodynia: Secondary | ICD-10-CM

## 2020-09-22 DIAGNOSIS — R002 Palpitations: Secondary | ICD-10-CM

## 2020-09-22 NOTE — Patient Instructions (Signed)
Medication Instructions:  Your physician has recommended you make the following change in your medication:  1.) stop Xarelto  *If you need a refill on your cardiac medications before your next appointment, please call your pharmacy*   Lab Work: none If you have labs (blood work) drawn today and your tests are completely normal, you will receive your results only by: Marland Kitchen MyChart Message (if you have MyChart) OR . A paper copy in the mail If you have any lab test that is abnormal or we need to change your treatment, we will call you to review the results.   Testing/Procedures: Your physician has requested that you have an echocardiogram. Echocardiography is a painless test that uses sound waves to create images of your heart. It provides your doctor with information about the size and shape of your heart and how well your heart's chambers and valves are working. This procedure takes approximately one hour. There are no restrictions for this procedure.  ZIO XT- Long Term Monitor Instructions   Your physician has requested you wear your ZIO patch monitor___3____days.   This is a single patch monitor.  [employer] supplies one patch monitor per enrollment.  Additional stickers are not available.   Please do not apply patch if you will be having a Nuclear Stress Test, Echocardiogram, Cardiac CT, MRI, or Chest Xray during the time frame you would be wearing the monitor. The patch cannot be worn during these tests.  You cannot remove and re-apply the ZIO XT patch monitor.   Your ZIO patch monitor will be sent USPS Priority mail from [facility] directly to your home address. The monitor may also be mailed to a PO BOX if home delivery is not available.   It may take 3-5 days to receive your monitor after you have been enrolled.   Once you have received you monitor, please review enclosed instructions.  Your monitor has already been registered assigning a specific monitor serial # to you.    Applying the monitor   Shave hair from upper left chest.   Hold abrader disc by orange tab.  Rub abrader in 40 strokes over left upper chest as indicated in your monitor instructions.   Clean area with 4 enclosed alcohol pads .  Use all pads to assure are is cleaned thoroughly.  Let dry.   Apply patch as indicated in monitor instructions.  Patch will be place under collarbone on left side of chest with arrow pointing upward.   Rub patch adhesive wings for 2 minutes.Remove white label marked "1".  Remove white label marked "2".  Rub patch adhesive wings for 2 additional minutes.   While looking in a mirror, press and release button in center of patch.  A small green light will flash 3-4 times .  This will be your only indicator the monitor has been turned on.     Do not shower for the first 24 hours.  You may shower after the first 24 hours.   Press button if you feel a symptom. You will hear a small click.  Record Date, Time and Symptom in the Patient Log Book.   When you are ready to remove patch, follow instructions on last 2 pages of Patient Log Book.  Stick patch monitor onto last page of Patient Log Book.   Place Patient Log Book in [place] box.  Use locking tab on box and tape box closed securely.  The [employer] has [employer] on it.  Please  place in mailbox as soon as possible.  Your physician should have your test results approximately 7 days after the monitor has been mailed back to [facility].   Call [place] at [phone] if you have questions regarding your ZIO XT patch monitor.  Call them immediately if you see an orange light blinking on your monitor.   If your monitor falls off in less than 4 days contact our Monitor department at [phone].  If your monitor becomes loose or falls off after 4 days call [employer] at [phone] for suggestions on securing your monitor.     Follow-Up: Follow up with your physician will depend on  test results.    Other Instructions

## 2020-09-22 NOTE — Progress Notes (Signed)
Patient ID: Isabel Tyler, female   DOB: Jan 29, 1951, 70 y.o.   MRN: [id number] 3 day ZIO XT long term holter monitor shipped to address on file.

## 2020-09-22 NOTE — Progress Notes (Signed)
Cardiology Office Note   Date:  09/22/2020   ID:  Isabel Tyler, DOB [birth year], MRN [id number]  PCP:  Unk Pinto, MD  Cardiologist:   Dorris Carnes, MD   Pt referred by Dr Melford Aase for atrial fibrillation     History of Present Illness: Isabel Tyler is a 70 y.o. female with a history of HTN, HL, hypothyroidism,  Myovue x 2  Last in 2015 Normal  Followed by [name].  Seen in clinic on 09/10/20  No CP  No dizziness  No palpitations    EKG was done that reportedly showed atrial fibrillation The pt was placed on Xarelto and referred to cardiology     Current Meds  Medication Sig  . alendronate (FOSAMAX) 70 MG tablet TAKE 1 TABLET BY MOUTH EVERY WEEK IN THE MORNING ON AN EMPTY STOMACH WITH ONLY A GLASS OF WATER; NOTHING BY MOUTH FOR 1 HOUR  . Ascorbic Acid (VITAMIN C) 1000 MG tablet Take 1,000 mg by mouth daily.  Marland Kitchen aspirin EC 81 MG tablet Take 81 mg by mouth daily.  . Brimonidine Tartrate 0.33 % GEL Apply 1 application topically daily as needed.  . Calcium Citrate (CITRACAL PO) Take 1 tablet by mouth daily.  . Cholecalciferol (VITAMIN D-3) 5000 units TABS Take 1 tablet by mouth daily.  Marland Kitchen dicyclomine (BENTYL) 10 MG capsule Take one tablet up to four times a day as needed.  Take before meals.  Marland Kitchen escitalopram (LEXAPRO) 20 MG tablet Take one tablet daily.  . Flaxseed, Linseed, (FLAXSEED OIL) 1200 MG CAPS Take 1 capsule by mouth daily.  Marland Kitchen levothyroxine (SYNTHROID) 75 MCG tablet Take      1 tablet Daily        on an Empty Stomach      with only water for 30 minutes  & No Antacid meds, Calcium or Magnesium for 4 hours and AVOID Biotin  . Omega-3 Fatty Acids (FISH OIL) 1200 MG CAPS Take 1 capsule by mouth daily.  . Probiotic Product (PROBIOTIC-10) CAPS Take 1 capsule by mouth daily.  Marland Kitchen RIVAROXABAN (XARELTO) VTE STARTER PACK (15 & 20 MG) Follow package directions: Take one 15mg  tablet by mouth twice a day. On day 22, switch to one 20mg  tablet once a day. Take with food.  .  rosuvastatin (CRESTOR) 40 MG tablet Take 40 mg by mouth 3 (three) times a week.  . TURMERIC PO Take 1,400 mg by mouth daily.     Allergies:   Ciprofloxacin and Z-pak [azithromycin]   Past Medical History:  Diagnosis Date  . Anxiety   . Diverticular disease   . Hx of diverticulitis of colon 04/04/2017  . Hyperlipidemia, mixed   . Hypothyroidism 2000  . Vitamin D deficiency     Past Surgical History:  Procedure Laterality Date  . BREAST BIOPSY Left    benign  . CHOLECYSTECTOMY N/A 02/08/2017   Procedure: LAPAROSCOPIC CHOLECYSTECTOMY WITH INTRAOPERATIVE CHOLANGIOGRAM;  Surgeon: Donnie Mesa, MD;  Location: [facility];  Service: General;  Laterality: N/A;  . EYE SURGERY     Eyelid lifted  . TONSILLECTOMY       Social History:  The patient  reports that she has never smoked. She has never used smokeless tobacco. She reports current alcohol use. She reports that she does not use drugs.   Family History:  The patient's family history includes Breast cancer in her sister; Heart disease in her father and mother.    ROS:  Please see the history of present  illness. All other systems are reviewed and  Negative to the above problem except as noted.    PHYSICAL EXAM: VS:  BP 104/80   Pulse 66   Ht 5' 0.5" (1.537 m)   Wt 113 lb 13.6 oz (51.6 kg)   SpO2 98%   BMI 21.87 kg/m   GEN: Well nourished, well developed, in no acute distress  HEENT: normal  Neck: no JVD, no carotid bruits Cardiac:  Regularly irregular  On initial sitting   Calmed to RRR   ; no murmurs or definite clicks.  No LE  edema  Respiratory:  clear to auscultation bilaterally, GI: soft, nontender, nondistended, + BS  No hepatomegaly  MS: no deformity Moving all extremities   Skin: warm and dry, no rash Neuro:  Strength and sensation are intact Psych: euthymic mood, full affect   EKG:  EKG is ordered today.  SR 66 bpm     Second EKG on sitting:  SR with atrial bigeminy 67 bpm   Lipid Panel    Component Value  Date/Time   CHOL 172 09/10/2020 0957   TRIG 70 09/10/2020 0957   HDL 68 09/10/2020 0957   CHOLHDL 2.5 09/10/2020 0957   VLDL 13 04/05/2017 1046   LDLCALC 88 09/10/2020 0957      Wt Readings from Last 3 Encounters:  09/22/20 113 lb 13.6 oz (51.6 kg)  09/10/20 112 lb 12.8 oz (51.2 kg)  04/16/20 113 lb 3.2 oz (51.3 kg)      ASSESSMENT AND PLAN:  1  Rhythm   I have reviewed EKG that was done on  09/09/20   P waves are difficult to see in main portion of EKG   But rhythm strip with V1, p waves are visible  IT appears to be atrial bigeminy, similar to ekg today  I have recomm that pt stop Xarelto   Resume aspiring  Will set the pt up for a 3 day monitor to follow extent of ectopy   Again, with her going into SR with atrial bigeminy here in office I think she is probably having a lot of this  Will also set up for an echo  F/U based on results  2  ? Hx of MVP   Told had a click on exam in past   I do not hear one   Will get echo  3  HTN   Good control  4  HL   On Crestor   LDL 88  HDL 68     F/u bsed on test results   Current medicines are reviewed at length with the patient today.  The patient does not have concerns regarding medicines.  Signed, Dorris Carnes, MD  09/22/2020 4:22 PM    [facility] [address], [place], [place]  [address] Phone: [phone]; Fax: [phone]

## 2020-09-23 ENCOUNTER — Encounter: Payer: Self-pay | Admitting: *Deleted

## 2020-09-27 DIAGNOSIS — R002 Palpitations: Secondary | ICD-10-CM | POA: Diagnosis not present

## 2020-09-27 DIAGNOSIS — R9431 Abnormal electrocardiogram [ECG] [EKG]: Secondary | ICD-10-CM

## 2020-09-29 ENCOUNTER — Ambulatory Visit (INDEPENDENT_AMBULATORY_CARE_PROVIDER_SITE_OTHER): Payer: Medicare Other | Admitting: Internal Medicine

## 2020-09-29 ENCOUNTER — Other Ambulatory Visit: Payer: Medicare Other

## 2020-09-29 ENCOUNTER — Encounter: Payer: Self-pay | Admitting: Internal Medicine

## 2020-09-29 VITALS — BP 118/68 | HR 75 | Ht 61.0 in | Wt 112.0 lb

## 2020-09-29 DIAGNOSIS — R142 Eructation: Secondary | ICD-10-CM

## 2020-09-29 DIAGNOSIS — R140 Abdominal distension (gaseous): Secondary | ICD-10-CM

## 2020-09-29 DIAGNOSIS — Z1211 Encounter for screening for malignant neoplasm of colon: Secondary | ICD-10-CM

## 2020-09-29 DIAGNOSIS — R109 Unspecified abdominal pain: Secondary | ICD-10-CM | POA: Diagnosis not present

## 2020-09-29 DIAGNOSIS — R12 Heartburn: Secondary | ICD-10-CM | POA: Diagnosis not present

## 2020-09-29 MED ORDER — SUTAB 1479-225-188 MG PO TABS: ORAL_TABLET | ORAL | 0 refills | Status: DC

## 2020-09-29 NOTE — Progress Notes (Signed)
Patient ID: Isabel Tyler, female   DOB: [birth year], 70 y.o.   MRN: [id number] HPI: Isabel Tyler is a 70 year old female with a history of previous diverticulitis, remote colon polyps (no known advanced colon polyps), hypothyroidism, hyperlipidemia, vitamin D deficiency who is seen to evaluate possible reflux as well as upper abdominal pressure and belching symptom.  She is here alone today.  She has prior GI history with Dr. Osvaldo Human in [place], [place].  She has had several previous colonoscopies and feels that her last one was 8 to 10 years ago.  She does recall history of colon polyps but none that were advanced or troublesome.  She has had prior upper endoscopy but greater than 10 years ago.  She had cholecystectomy in June 2018 in an effort to "help with my digestive system" but she found no improvement after surgery.  There was chronic cholecystitis seen on pathology.  She reports that she had an isolated episode occurring on the night of 07/01/2020 of epigastric abdominal burning which radiated up into her chest.  This was associated with cough and chest tightness.  It occurred while she was sleeping but 3 times during this night.  It had not occurred previously nor has it occurred since.  She recalls that on the evening prior to the episodes she had eaten a tomato-based soup with ice tea and a chocolate chip cookie.  Outside of this episode she does not have heartburn.  The question has been raised to "silent reflux" and she did take omeprazole and pantoprazole in the past for several weeks without any improvement in her symptoms.  She reports that she will frequently have upper abdominal pressure type sensation with the need for belching.  This is most prominent after breakfast and dinner and less prominent at lunch.  This can be present after these meals for several weeks in a row and then be gone for maybe a week or so.  Sometimes she feels like she cannot burp and so she will drink  a soda to help with belching.  She reports her belching is quite "forceful".  When the upper abdominal pressure is most troublesome she feels that it can be difficult to take a deep breath and she feels overall "tired".  Outside of these episodes there is no abdominal pain.  No early satiety.  No nausea or vomiting.  Stable weight.  This is been going on for many years.  She does have some lower intestinal gas but predominantly her symptoms are upper.  Bowel movements for her are regular without blood or melena.  She does not have heartburn on a regular basis.  She was tested for H. pylori by breath test and this was negative.  There is no family history of GI tract malignancy.  No known family history of celiac disease.  She has recently been evaluated by cardiology, Dr. Harrington Challenger.  There was some concern for atrial fibrillation prior to her cardiology visit and she was briefly placed on Xarelto.  Dr. Alan Ripper note is reviewed which seems to show atrial bigeminy and not atrial fibrillation.  She stopped Xarelto and resumed aspirin.  She is wearing a 3-day monitor currently.  Past Medical History:  Diagnosis Date  . Anxiety   . Diverticular disease   . GERD (gastroesophageal reflux disease)   . Hx of diverticulitis of colon 04/04/2017  . Hyperlipidemia, mixed   . Hypothyroidism 2000  . Vitamin D deficiency     Past Surgical History:  Procedure Laterality  Date  . BREAST BIOPSY Left    benign  . CHOLECYSTECTOMY N/A 02/08/2017   Procedure: LAPAROSCOPIC CHOLECYSTECTOMY WITH INTRAOPERATIVE CHOLANGIOGRAM;  Surgeon: Donnie Mesa, MD;  Location: [facility];  Service: General;  Laterality: N/A;  . EYE SURGERY     Eyelid lifted  . TONSILLECTOMY      Outpatient Medications Prior to Visit  Medication Sig Dispense Refill  . alendronate (FOSAMAX) 70 MG tablet TAKE 1 TABLET BY MOUTH EVERY WEEK IN THE MORNING ON AN EMPTY STOMACH WITH ONLY A GLASS OF WATER; NOTHING BY MOUTH FOR 1 HOUR 12 tablet 3  . Ascorbic  Acid (VITAMIN C) 1000 MG tablet Take 1,000 mg by mouth daily.    Marland Kitchen aspirin EC 81 MG tablet Take 81 mg by mouth daily.    . Brimonidine Tartrate 0.33 % GEL Apply 1 application topically daily as needed. 30 g 1  . Calcium Citrate (CITRACAL PO) Take 1 tablet by mouth daily.    . cetirizine (ZYRTEC) 10 MG tablet Take 10 mg by mouth daily.    . Cholecalciferol (VITAMIN D-3) 5000 units TABS Take 1 tablet by mouth daily.    Marland Kitchen dicyclomine (BENTYL) 10 MG capsule Take one tablet up to four times a day as needed.  Take before meals. 56 capsule 0  . diphenhydrAMINE HCl, Sleep, (ZZZQUIL PO) Take by mouth. As needed for sleep    . escitalopram (LEXAPRO) 20 MG tablet Take one tablet daily. 90 tablet 2  . Flaxseed, Linseed, (FLAXSEED OIL) 1200 MG CAPS Take 1 capsule by mouth daily.    Marland Kitchen levothyroxine (SYNTHROID) 75 MCG tablet Take      1 tablet Daily        on an Empty Stomach      with only water for 30 minutes  & No Antacid meds, Calcium or Magnesium for 4 hours and AVOID Biotin 90 tablet 3  . Multiple Vitamins-Minerals (CERTA PLUS PO) Take by mouth.    . Omega-3 Fatty Acids (FISH OIL) 1200 MG CAPS Take 1 capsule by mouth daily.    . Probiotic Product (PROBIOTIC-10) CAPS Take 1 capsule by mouth daily.    . rosuvastatin (CRESTOR) 40 MG tablet Take 40 mg by mouth 3 (three) times a week.    . TURMERIC PO Take 1,400 mg by mouth daily.     No facility-administered medications prior to visit.    Allergies  Allergen Reactions  . Ciprofloxacin Other (See Comments)    Alleged tendonitis  . Z-Pak [Azithromycin] Other (See Comments)    Patient reports that the reaction was GI upset issues.    Family History  Problem Relation Age of Onset  . Heart disease Mother   . Heart disease Father   . Breast cancer Sister        around age 104  . Stomach cancer Neg Hx   . Colon cancer Neg Hx   . Esophageal cancer Neg Hx   . Pancreatic cancer Neg Hx     Social History   Tobacco Use  . Smoking status: Never  Smoker  . Smokeless tobacco: Never Used  Vaping Use  . Vaping Use: Never used  Substance Use Topics  . Alcohol use: Yes    Comment: occasional   . Drug use: No    ROS: As per history of present illness, otherwise negative  BP 118/68   Pulse 75   Ht 5\' 1"  (1.549 m)   Wt 112 lb (50.8 kg)   BMI 21.16 kg/m  Constitutional: Well-developed and well-nourished. No distress. HEENT: Normocephalic and atraumatic.  Conjunctivae are normal.  No scleral icterus. Cardiovascular: Normal rate, regular rhythm and intact distal pulses. No M/R/G Pulmonary/chest: Effort normal and breath sounds normal. No wheezing, rales or rhonchi. Abdominal: Soft, nontender, nondistended. Bowel sounds active throughout. There are no masses palpable. No hepatosplenomegaly. Extremities: no clubbing, cyanosis, or edema Neurological: Alert and oriented to person place and time. Skin: Skin is warm and dry.  Psychiatric: Normal mood and affect. Behavior is normal.  RELEVANT LABS AND IMAGING: CBC    Component Value Date/Time   WBC 4.6 09/10/2020 0957   RBC 4.09 09/10/2020 0957   HGB 13.1 09/10/2020 0957   HCT 38.5 09/10/2020 0957   PLT 267 09/10/2020 0957   MCV 94.1 09/10/2020 0957   MCH 32.0 09/10/2020 0957   MCHC 34.0 09/10/2020 0957   RDW 11.7 09/10/2020 0957   LYMPHSABS 1,472 09/10/2020 0957   MONOABS 392 04/05/2017 1046   EOSABS 28 09/10/2020 0957   BASOSABS 51 09/10/2020 0957    CMP     Component Value Date/Time   NA 137 09/10/2020 0957   K 5.3 09/10/2020 0957   CL 102 09/10/2020 0957   CO2 30 09/10/2020 0957   GLUCOSE 96 09/10/2020 0957   BUN 19 09/10/2020 0957   CREATININE 0.70 09/10/2020 0957   CALCIUM 9.4 09/10/2020 0957   PROT 6.6 09/10/2020 0957   ALBUMIN 4.4 04/05/2017 1046   AST 18 09/10/2020 0957   ALT 16 09/10/2020 0957   ALKPHOS 74 04/05/2017 1046   BILITOT 0.6 09/10/2020 0957   GFRNONAA 90 09/10/2020 0957   GFRAA 104 09/10/2020 0957    ASSESSMENT/PLAN: 70 year old female  with a history of previous diverticulitis, remote colon polyps (no known advanced colon polyps), hypothyroidism, hyperlipidemia, vitamin D deficiency who is seen to evaluate possible reflux as well as upper abdominal pressure and belching symptom.  1.  Isolated heartburn --the episode she describes from November is most consistent with an episode of nocturnal acid reflux.  It likely induced an element of esophageal spasm.  She is not having recurrent issues with heartburn, day or night, and therefore I do not feel that we need to prescribe an antireflux medication at this time.  We will be pursuing upper endoscopy, see #2  2.  Eructation/upper abdominal pressure --she describes predominantly postprandial upper abdominal pressure and bloating type symptom relieved by eructation.  No other concern for gastroparesis given lack of risk factors, lack of nausea and early satiety.  I do think it is reasonable to perform upper endoscopy to evaluate and rule out esophagitis, gastritis.  SIBO is considered. --Upper endoscopy; we discussed the risk, benefits and alternatives and she is agreeable and wishes to proceed --TTG and IgA today --SIBO breath testing if upper endoscopy negative  3.  Colon cancer screening/remote history of colon polyps --we will request her GI records from Dr. Osvaldo Human in [place].  She feels that it has been likely 10 years which would make her due for screening.  We will tentatively schedule colonoscopy on the same day as her upper endoscopy pending review of requested records. --Colonoscopy in the [place]; risk, benefits and alternatives reviewed and she is agreeable and wishes to proceed.   [id number], Gwyndolyn Saxon, [place] [facility],   [id number]

## 2020-09-29 NOTE — Patient Instructions (Addendum)
We will request your records from Dr Osvaldo Human in [place], [place].  You have been scheduled for an endoscopy and colonoscopy. Please follow the written instructions given to you at your visit today. Please pick up your prep supplies at the pharmacy within the next 1-3 days. If you use inhalers (even only as needed), please bring them with you on the day of your procedure.  Your provider has requested that you go to the basement level for lab work before leaving today. Press "B" on the elevator. The lab is located at the first door on the left as you exit the elevator.  If you are age 64 or older, your body mass index should be between 23-30. Your Body mass index is 21.16 kg/m. If this is out of the aforementioned range listed, please consider follow up with your Primary Care Provider.  If you are age 71 or younger, your body mass index should be between 19-25. Your Body mass index is 21.16 kg/m. If this is out of the aformentioned range listed, please consider follow up with your Primary Care Provider.   Due to recent changes in healthcare laws, you may see the results of your imaging and laboratory studies on MyChart before your provider has had a chance to review them.  We understand that in some cases there may be results that are confusing or concerning to you. Not all laboratory results come back in the same time frame and the provider may be waiting for multiple results in order to interpret others.  Please give [place] 48 hours in order for your provider to thoroughly review all the results before contacting the office for clarification of your results.

## 2020-09-30 LAB — IGA: Immunoglobulin A: 346 mg/dL — ABNORMAL HIGH (ref 70–320)

## 2020-09-30 LAB — TISSUE TRANSGLUTAMINASE, IGA: (tTG) Ab, IgA: 25.7 U/mL — ABNORMAL HIGH

## 2020-10-05 DIAGNOSIS — R9431 Abnormal electrocardiogram [ECG] [EKG]: Secondary | ICD-10-CM | POA: Diagnosis not present

## 2020-10-05 DIAGNOSIS — R002 Palpitations: Secondary | ICD-10-CM | POA: Diagnosis not present

## 2020-10-06 ENCOUNTER — Other Ambulatory Visit: Payer: Self-pay | Admitting: Internal Medicine

## 2020-10-07 ENCOUNTER — Telehealth: Payer: Self-pay | Admitting: Nurse Practitioner

## 2020-10-07 MED ORDER — METOPROLOL SUCCINATE ER 25 MG PO TB24: 12.5000 mg | ORAL_TABLET | Freq: Every day | ORAL | 3 refills | Status: DC

## 2020-10-07 NOTE — Telephone Encounter (Signed)
Cardiac monitor results reviewed with patient who verbalized understanding.  Patient is agreeable to start Toprol XL 12.5 mg daily and Isabel Tyler will purchase a BP cuff to monitor BP at home. I advised her to keep appointment for echo on 2/18 and to call back with questions or concerns. Isabel Tyler thanked me for the call.

## 2020-10-11 ENCOUNTER — Other Ambulatory Visit: Payer: Self-pay | Admitting: Adult Health Nurse Practitioner

## 2020-10-11 DIAGNOSIS — F3341 Major depressive disorder, recurrent, in partial remission: Secondary | ICD-10-CM

## 2020-10-15 ENCOUNTER — Other Ambulatory Visit: Payer: Self-pay

## 2020-10-15 ENCOUNTER — Ambulatory Visit (HOSPITAL_COMMUNITY): Payer: Medicare Other | Attending: Internal Medicine

## 2020-10-15 DIAGNOSIS — R9431 Abnormal electrocardiogram [ECG] [EKG]: Secondary | ICD-10-CM | POA: Diagnosis not present

## 2020-10-15 DIAGNOSIS — R002 Palpitations: Secondary | ICD-10-CM | POA: Diagnosis not present

## 2020-10-15 LAB — ECHOCARDIOGRAM COMPLETE
Area-P 1/2: 5.46 cm2
S' Lateral: 2.8 cm

## 2020-11-10 ENCOUNTER — Ambulatory Visit: Payer: Medicare Other | Admitting: Adult Health

## 2020-11-15 ENCOUNTER — Other Ambulatory Visit: Payer: Self-pay

## 2020-11-15 ENCOUNTER — Ambulatory Visit (INDEPENDENT_AMBULATORY_CARE_PROVIDER_SITE_OTHER): Payer: Medicare Other | Admitting: Adult Health Nurse Practitioner

## 2020-11-15 ENCOUNTER — Encounter: Payer: Self-pay | Admitting: Adult Health Nurse Practitioner

## 2020-11-15 ENCOUNTER — Telehealth: Payer: Self-pay | Admitting: Internal Medicine

## 2020-11-15 VITALS — BP 120/86 | HR 60 | Temp 97.6°F | Wt 113.0 lb

## 2020-11-15 DIAGNOSIS — Z1152 Encounter for screening for COVID-19: Secondary | ICD-10-CM | POA: Diagnosis not present

## 2020-11-15 DIAGNOSIS — J029 Acute pharyngitis, unspecified: Secondary | ICD-10-CM

## 2020-11-15 DIAGNOSIS — J0141 Acute recurrent pansinusitis: Secondary | ICD-10-CM

## 2020-11-15 LAB — POC COVID19 BINAXNOW: SARS Coronavirus 2 Ag: NEGATIVE

## 2020-11-15 MED ORDER — DEXAMETHASONE SODIUM PHOSPHATE 10 MG/ML IJ SOLN: 10.0000 mg | Freq: Once | INTRAMUSCULAR | Status: DC

## 2020-11-15 MED ORDER — DOXYCYCLINE HYCLATE 100 MG PO TABS: 100.0000 mg | ORAL_TABLET | Freq: Two times a day (BID) | ORAL | 0 refills | Status: AC

## 2020-11-15 NOTE — Progress Notes (Signed)
Assessment and Plan:  Isabel Tyler was seen today for cough and nasal congestion.  Diagnoses and all orders for this visit:  Encounter for screening for COVID-19 -     POC COVID-19 BinaxNow -Neg  Acute recurrent pansinusitis -     doxycycline (VIBRA-TABS) 100 MG tablet; Take 1 tablet (100 mg total) by mouth 2 (two) times daily for 7 days. -     dexamethasone (DECADRON) injection 10 mg Discussed antihistamine, change from zyrtec to allegra or xyzal  Pharyngitis, unspecified etiology Discussed salt water gargles IM Dexamethasone in office today  Contact office with any new or worsening symptoms.   Further disposition pending results of labs. Discussed med's effects and SE's.   Over 30 minutes of face to face interview, exam, counseling, chart review, and critical decision making was performed.   Future Appointments  Date Time Provider [place]  11/19/2020  2:30 PM Pyrtle, Lajuan Lines, MD [facility]  12/29/2020  2:30 PM Liane Comber, NP [facility] None  04/04/2021  2:30 PM Unk Pinto, MD [facility] None  09/21/2021 10:00 AM Unk Pinto, MD [facility] None    ------------------------------------------------------------------------------------------------------------------   HPI Isabel Tyler presents for evaluation cough that start four days ago.  She reports her cough is productive, yellowish phlem.  She has post nasal drip and runny nose.  She has had intermittent headaches that come and go. Some intermittent ear popping and fullness as well.  Denies any fevers or body aches.  She was taking zyrtec and noticed this was not drying her out.  She reports she tried a medication for sinus pressure and congestion.  She also took some ibuprofen for the headache and this helped.  She is scheduled to have colonoscopy and egd in three days.  Discussed contact GI office regarding this regarding pharyngeal edema.   Past Medical History:  Diagnosis Date  . Anxiety   .  Diverticular disease   . GERD (gastroesophageal reflux disease)   . Hx of diverticulitis of colon 04/04/2017  . Hyperlipidemia, mixed   . Hypothyroidism 2000  . Vitamin D deficiency      Allergies  Allergen Reactions  . Ciprofloxacin Other (See Comments)    Alleged tendonitis  . Z-Pak [Azithromycin] Other (See Comments)    Patient reports that the reaction was GI upset issues.    Current Outpatient Medications on File Prior to Visit  Medication Sig  . alendronate (FOSAMAX) 70 MG tablet TAKE 1 TABLET BY MOUTH EVERY WEEK IN THE MORNING ON AN EMPTY STOMACH WITH ONLY A GLASS OF WATER; NOTHING BY MOUTH FOR 1 HOUR  . Ascorbic Acid (VITAMIN C) 1000 MG tablet Take 1,000 mg by mouth daily.  Marland Kitchen aspirin EC 81 MG tablet Take 81 mg by mouth daily.  . Brimonidine Tartrate 0.33 % GEL Apply 1 application topically daily as needed.  . Calcium Citrate (CITRACAL PO) Take 1 tablet by mouth daily.  . cetirizine (ZYRTEC) 10 MG tablet Take 10 mg by mouth daily.  . Cholecalciferol (VITAMIN D-3) 5000 units TABS Take 1 tablet by mouth daily.  Marland Kitchen dicyclomine (BENTYL) 10 MG capsule Take one tablet up to four times a day as needed.  Take before meals.  . diphenhydrAMINE HCl, Sleep, (ZZZQUIL PO) Take by mouth. As needed for sleep  . escitalopram (LEXAPRO) 20 MG tablet TAKE 1 TABLET BY MOUTH DAILY  . Flaxseed, Linseed, (FLAXSEED OIL) 1200 MG CAPS Take 1 capsule by mouth daily.  Marland Kitchen levothyroxine (SYNTHROID) 75 MCG tablet Take  1 tablet Daily        on an Empty Stomach      with only water for 30 minutes  & No Antacid meds, Calcium or Magnesium for 4 hours and AVOID Biotin  . metoprolol succinate (TOPROL XL) 25 MG 24 hr tablet Take 0.5 tablets (12.5 mg total) by mouth daily.  . Multiple Vitamins-Minerals (CERTA PLUS PO) Take by mouth.  . Omega-3 Fatty Acids (FISH OIL) 1200 MG CAPS Take 1 capsule by mouth daily.  . Probiotic Product (PROBIOTIC-10) CAPS Take 1 capsule by mouth daily.  . rosuvastatin (CRESTOR) 40 MG  tablet Take  1 tablet  Daily  for Cholesterol  . Sodium Sulfate-Mag Sulfate-KCl (SUTAB) [phone] MG TABS Use as directed for colonoscopy. MANUFACTURER CODES!! BIN: [id number] PCN: CN GROUP: [id number] MEMBER ID: [id number];[employer] AS SECONDARY INSURANCE ;NO PRIOR AUTHORIZATION  . TURMERIC PO Take 1,400 mg by mouth daily.   No current facility-administered medications on file prior to visit.    ROS: all negative except above.   Physical Exam:  Pulse 60   Temp 97.6 F (36.4 C)   SpO2 95%   General Appearance: Well nourished, in no apparent distress. Eyes: PERRLA, EOMs, conjunctiva no swelling or erythema Sinuses: Frontal/maxillary tenderness ENT/Mouth: Ext aud canals clear, TMs without erythema, bulging. No erythema, swelling, or exudate on post pharynx.  Tonsils not swollen or erythematous. Hearing normal.  Neck: Supple, thyroid normal.  Respiratory: Respiratory effort normal, BS equal bilaterally with rhonchi, NO rales, wheezing or stridor.  Cardio: RRR with no MRGs. Brisk peripheral pulses without edema.  Abdomen: Soft, + BS.  Non tender, no guarding, rebound, hernias, masses. Lymphatics: Non tender without lymphadenopathy.  Musculoskeletal: Full ROM, 5/5 strength, normal gait.  Skin: Warm, dry without rashes, lesions, ecchymosis.  Neuro: Cranial nerves intact. Normal muscle tone, no cerebellar symptoms. Sensation intact.  Psych: Awake and oriented X 3, normal affect, Insight and Judgment appropriate.     Isabel Tyler, Isabel Jean, DNP [facility] 11/15/2020  2:37 PM

## 2020-11-15 NOTE — Patient Instructions (Addendum)
  In the office you received an injection for dexamethasone, steroid, to help reduce inflammation of throat, lungs ans sinuses.  Take Mucinex DM every 12 hours to help thin mucus and get it out.  Also has a cough suppressant.  You will still cough put when you do stuff should come up.   If Cetirizine (zyrtec) is not drying up the drainage to back of throat, try Xyzal (levocetirazine) OR Allegra (Fexofenadine).   Promethazine cough syrup for you to use at night, it can make you sleepy.  We are going to call in Doxycycline 100mg  one tablet twice a day for seven days.  Increase the amount of water you drink to help keep mucus thin.   Contact the office with any new or worsening symptoms.

## 2020-11-15 NOTE — Telephone Encounter (Signed)
Pt cancelled her procedure for 11/19/20, reports she saw her PCP and she has a throat infection and chest congestion. Pt states she will call us back when she is ready to reschedule.

## 2020-11-15 NOTE — Telephone Encounter (Signed)
Called to remind patient of her appointment, she stated she was not feeling well and cancelled her appointment.  She stated she would call back to reschedule

## 2020-11-15 NOTE — Telephone Encounter (Signed)
Patient would like to speak with a nurse regarding a recent diagnosis Svt Supraentricula Tachycardia and has been given Metoprolol medication half a tablet of 12.5 mg. Wants to know if that is ok since she has an upcoming colonoscopy scheduled.

## 2020-11-19 ENCOUNTER — Encounter: Payer: Medicare Other | Admitting: Internal Medicine

## 2020-12-27 NOTE — Progress Notes (Signed)
MEDICARE ANNUAL WELLNESS VISIT AND 3 MONTH FOLLOW UP  Assessment:   Isabel Tyler was seen today for medicare wellness.  Diagnoses and all orders for this visit:  Encounter for Annual Medicare Visit Due annually   Elevated BP without diagnosis of hypertension At goal off of medications Monitor blood pressure at home; call if consistently over 130/80 Continue DASH diet.   Reminder to go to the ER if any CP, SOB, nausea, dizziness, severe HA, changes vision/speech, left arm numbness and tingling and jaw pain.  Hypothyroidism, unspecified type -     TSH, will check today Will contact with results  Diverticulosis of colon without hemorrhage Doing well on current regimen Continue probiotic  Gastroesophageal reflux disease, esophagitis presence not specified Well managed off of meds at this time Discussed diet, avoiding triggers and other lifestyle changes  Hyperlipidemia, mixed Continue on Crestor 40mg  At goal last visit Check lipids  COPD ([place]) Per CXR 2018; never smoker, asymptomatic, not on inhalers, monitor   Depression/anxiety Doing well on current regiment, hasn't tolerated taper Continue with benefit PQ2, negative  FHx: heart disease Control blood pressure, cholesterol, glucose, increase exercise.   Vitamin D deficiency Continue supplementation  Osteoporosis, unspecified  getting via GYN, reports requested  Continue Fosamax, doing well with this Teach back provided regarding proper medication administration Continue Calcium & Vit D supplements  Abnormal glucose Discussed disease progression and risks Discussed diet/exercise, weight management and risk modification  Hemangioma  Monitor, follow up with GI  Spinal stenosis  Recently improved; monitor       Over 40 minutes of exam, counseling, chart review and critical decision making was performed Future Appointments  Date Time Provider [place]  01/17/2021  3:00 PM Pyrtle, Lajuan Lines, MD [facility]  [facility]  04/04/2021  2:30 PM Unk Pinto, MD [facility] None  09/21/2021 10:00 AM Unk Pinto, MD [facility] None  12/29/2021  2:30 PM Liane Comber, NP [facility] None     Plan:   During the course of the visit the patient was educated and counseled about appropriate screening and preventive services including:    Pneumococcal vaccine   Prevnar 13  Influenza vaccine  Td vaccine  Screening electrocardiogram  Bone densitometry screening  Colorectal cancer screening  Diabetes screening  Glaucoma screening  Nutrition counseling   Advanced directives: requested   Subjective:  Isabel Tyler is a 70 y.o. female who presents for Medicare Annual Wellness Visit and 3 month follow up.   Watches her grandkids 1/2 day 4 days a week.   she has a diagnosis of depression/anxiety and is currently on lexapro 20 mg daily, reports symptoms are well controlled on current regimen. She has tried to taper off several times but hasn't done well.   Hemangioma, 7 cm of R lobe, noted 08/2015 on imaging , had been declining follow up until after covid 19.   She has been having epigastric sx; EKG in office showed possible a. Fib, cardiology Dr. Harrington Challenger felt more atrial bigeminy, had holter 09/2020 showing NSR with PACs, rare brief SVT, had normal ECHO other than mild MR. She was initiated on toprol 12.5 mg daily and doing well.   Has also followed up with GI Dr. Hilarie Tyler, due to discomfort and belching, however not improving with PPI,  EGD and colonoscopy, scheduled 01/17/2021.  BMI is Body mass index is 21.35 kg/m., she has been working on diet and exercise. Wt Readings from Last 3 Encounters:  12/29/20 113 lb (51.3 kg)  11/15/20 113 lb (51.3 kg)  09/29/20 112 lb (50.8 kg)    Her blood pressure has been controlled at home, today their BP is BP: 106/70 She does workout. She denies chest pain, shortness of breath, dizziness.   She is on cholesterol medication and denies myalgias.  Her cholesterol is at goal. The cholesterol last visit was:   Lab Results  Component Value Date   CHOL 172 09/10/2020   HDL 68 09/10/2020   LDLCALC 88 09/10/2020   TRIG 70 09/10/2020   CHOLHDL 2.5 09/10/2020    Last A1C in the office was:  Lab Results  Component Value Date   HGBA1C 5.4 09/10/2020   Last GFR: Lab Results  Component Value Date   GFRNONAA 90 09/10/2020   She is on thyroid medication. Her medication was not changed last visit.   Lab Results  Component Value Date   TSH 1.73 09/10/2020  . Patient is on Vitamin D supplement.   Lab Results  Component Value Date   VD25OH 75 09/10/2020      Medication Review: Current Outpatient Medications on File Prior to Visit  Medication Sig Dispense Refill  . alendronate (FOSAMAX) 70 MG tablet TAKE 1 TABLET BY MOUTH EVERY WEEK IN THE MORNING ON AN EMPTY STOMACH WITH ONLY A GLASS OF WATER; NOTHING BY MOUTH FOR 1 HOUR 12 tablet 3  . Ascorbic Acid (VITAMIN C) 1000 MG tablet Take 1,000 mg by mouth daily.    Marland Kitchen aspirin EC 81 MG tablet Take 81 mg by mouth daily.    . Brimonidine Tartrate 0.33 % GEL Apply 1 application topically daily as needed. 30 g 1  . Calcium Citrate (CITRACAL PO) Take 1 tablet by mouth daily.    . cetirizine (ZYRTEC) 10 MG tablet Take 10 mg by mouth daily.    . Cholecalciferol (VITAMIN D-3) 5000 units TABS Take 1 tablet by mouth daily.    Marland Kitchen escitalopram (LEXAPRO) 20 MG tablet TAKE 1 TABLET BY MOUTH DAILY 90 tablet 2  . Flaxseed, Linseed, (FLAXSEED OIL) 1200 MG CAPS Take 1 capsule by mouth daily.    Marland Kitchen levothyroxine (SYNTHROID) 75 MCG tablet Take      1 tablet Daily        on an Empty Stomach      with only water for 30 minutes  & No Antacid meds, Calcium or Magnesium for 4 hours and AVOID Biotin 90 tablet 3  . metoprolol succinate (TOPROL XL) 25 MG 24 hr tablet Take 0.5 tablets (12.5 mg total) by mouth daily. 45 tablet 3  . Omega-3 Fatty Acids (FISH OIL) 1200 MG CAPS Take 1 capsule by mouth daily.    . Probiotic  Product (PROBIOTIC-10) CAPS Take 1 capsule by mouth daily.    . rosuvastatin (CRESTOR) 40 MG tablet Take  1 tablet  Daily  for Cholesterol 90 tablet 0  . Sodium Sulfate-Mag Sulfate-KCl (SUTAB) [phone] MG TABS Use as directed for colonoscopy. MANUFACTURER CODES!! BIN: [id number] PCN: CN GROUP: [id number] MEMBER ID: [id number];[employer] AS SECONDARY INSURANCE ;NO PRIOR AUTHORIZATION 24 tablet 0  . TURMERIC PO Take 1,400 mg by mouth daily.     Current Facility-Administered Medications on File Prior to Visit  Medication Dose Route Frequency Provider Last Rate Last Admin  . dexamethasone (DECADRON) injection 10 mg  10 mg Intramuscular Once [name], NP        Allergies  Allergen Reactions  . Ciprofloxacin Other (See Comments)    Alleged tendonitis  . Z-Pak [Azithromycin] Other (See Comments)    Patient reports that  the reaction was GI upset issues.    Current Problems (verified) Patient Active Problem List   Diagnosis Date Noted  . COPD (chronic obstructive pulmonary disease) ([place]) 11/11/2019  . Osteoporosis 10/07/2019  . Rosacea 12/12/2018  . Trigger thumb, left thumb 09/26/2018  . Lumbar spinal stenosis 09/03/2018  . Vitamin D deficiency 06/06/2018  . Abnormal glucose 06/06/2018  . Elevated BP without diagnosis of hypertension 06/06/2018  . Gastroesophageal reflux disease 04/04/2017  . Hepatic hemangioma, Rt lobe (7 cm) Jan 2017 12/12/2016  . Hyperlipidemia, mixed 10/23/2016  . Hypothyroidism 10/23/2016  . Major depressive disorder, recurrent episode, in partial remission ([place]) 10/23/2016  . Diverticulosis of colon without hemorrhage 10/23/2016    Screening Tests Immunization History  Administered Date(s) Administered  . Influenza Inj Mdck Quad With Preservative 07/27/2017  . Influenza, High Dose Seasonal PF 07/16/2018  . PFIZER(Purple Top)SARS-COV-2 Vaccination 10/04/2019, 10/30/2019, 06/28/2020  . PPD Test 04/05/2017  . Pneumococcal Conjugate-13 06/06/2018  . Tdap  08/28/2010   Preventative care: [place] no [place] available, 10 year follow up per patient  Last mammogram: 09/22/2020, at GYN, Dr. Julien Girt  Last pap smear/pelvic exam: goes annually, GYN handles  DEXA: managed at GYN, reports has had since 2018, on fosamax - reports requested  CXR: 06/2020  Prior vaccinations: TD or Tdap: 2012, will boost per patient pref, knows insurance  Influenza: 2019, declines Pneumococcal: Due, will get next OV per patient prefer Prevnar13: 05/2018 Shingles/Zostavax: Declined  Covid 19: 3/3, last 06/2020   Names of Other Physician/Practitioners you currently use: 1.  [facility] for [facility] 2. Eye Exam, Dr. Delman Cheadle 2021 3.Dentist: New dentist in [place], 2022, Q7months  Patient Care Team: Unk Pinto, MD as PCP - General (Internal Medicine) Fay Records, MD as PCP - Cardiology (Cardiology)  SURGICAL HISTORY She  has a past surgical history that includes Breast biopsy (Left); Tonsillectomy; Eye surgery; and Cholecystectomy (N/A, 02/08/2017). FAMILY HISTORY Her family history includes Breast cancer in her sister; Heart disease in her father and mother. SOCIAL HISTORY She  reports that she has never smoked. She has never used smokeless tobacco. She reports current alcohol use. She reports that she does not use drugs.   MEDICARE WELLNESS OBJECTIVES: Physical activity: Current Exercise Habits: Home exercise routine, Type of exercise: walking;Other - see comments (aerobics), Time (Minutes): 40, Frequency (Times/Week): 3, Weekly Exercise (Minutes/Week): 120, Intensity: Mild, Exercise limited by: None identified Cardiac risk factors: Cardiac Risk Factors include: advanced age (>42men, >83 women);dyslipidemia;hypertension Depression/mood screen:   Depression screen [facility] 2/9 12/29/2020  Decreased Interest 0  Down, Depressed, Hopeless 0  PHQ - 2 Score 0    ADLs:  In your present  state of health, do you have any difficulty performing the following activities: 12/29/2020 09/09/2020  Hearing? N N  Vision? N N  Difficulty concentrating or making decisions? N N  Walking or climbing stairs? N N  Dressing or bathing? N N  Doing errands, shopping? N N  Some recent data might be hidden     Cognitive Testing  Alert? Yes  Normal Appearance?Yes  Oriented to person? Yes  Place? Yes   Time? Yes  Recall of three objects?  Yes  Can perform simple calculations? Yes  Displays appropriate judgment?Yes  Can read the correct time from a watch face?Yes  EOL planning: Does Patient Have a Medical Advance Directive?: Yes Type of Advance Directive: [place] will Does patient want to make changes to  medical advance directive?: No - Patient declined Copy of [place] in Chart?: No - copy requested  Review of Systems  Constitutional: Negative for malaise/fatigue and weight loss.  HENT: Negative for hearing loss and tinnitus.   Eyes: Negative for blurred vision and double vision.  Respiratory: Negative for cough, sputum production, shortness of breath and wheezing.   Cardiovascular: Negative for chest pain, palpitations, orthopnea, claudication, leg swelling and PND.  Gastrointestinal: Negative for abdominal pain, blood in stool, constipation, diarrhea, heartburn, melena, nausea and vomiting.  Genitourinary: Negative.   Musculoskeletal: Negative for falls, joint pain and myalgias.  Skin: Negative for rash.  Neurological: Negative for dizziness, tingling, sensory change, weakness and headaches.  Endo/Heme/Allergies: Negative for polydipsia.  Psychiatric/Behavioral: Negative.  Negative for depression, memory loss, substance abuse and suicidal ideas. The patient is not nervous/anxious and does not have insomnia.   All other systems reviewed and are negative.    Objective:     Today's Vitals   12/29/20 1425  BP: 106/70  Pulse: (!) 54   Temp: (!) 97.3 F (36.3 C)  SpO2: 97%  Weight: 113 lb (51.3 kg)   Body mass index is 21.35 kg/m.  General appearance: alert, no distress, WD/WN, female HEENT: normocephalic, sclerae anicteric, TMs pearly, nares patent, no discharge or erythema, pharynx normal Oral cavity: MMM, no lesions Neck: supple, no lymphadenopathy, no thyromegaly, no masses Heart: RRR, normal S1, S2, no murmurs Lungs: CTA bilaterally, no wheezes, rhonchi, or rales Abdomen: +bs, soft, non tender, non distended, no masses, no hepatomegaly, no splenomegaly Musculoskeletal: nontender, no swelling, no obvious deformity Extremities: no edema, no cyanosis, no clubbing Pulses: 2+ symmetric, upper and lower extremities, normal cap refill Neurological: alert, oriented x 3, CN2-12 intact, strength normal upper extremities and lower extremities, sensation normal throughout, DTRs 2+ throughout, no cerebellar signs, gait normal Psychiatric: normal affect, behavior normal, pleasant   Medicare Attestation I have personally reviewed: The patient's medical and social history Their use of alcohol, tobacco or illicit drugs Their current medications and supplements The patient's functional ability including ADLs,fall risks, home safety risks, cognitive, and hearing and visual impairment Diet and physical activities Evidence for depression or mood disorders  The patient's weight, height, BMI, and visual acuity have been recorded in the chart.  I have made referrals, counseling, and provided education to the patient based on review of the above and I have provided the patient with a written personalized care plan for preventive services.     Izora Ribas, NP   12/29/2020

## 2020-12-29 ENCOUNTER — Ambulatory Visit (INDEPENDENT_AMBULATORY_CARE_PROVIDER_SITE_OTHER): Payer: Medicare Other | Admitting: Adult Health

## 2020-12-29 ENCOUNTER — Encounter: Payer: Self-pay | Admitting: Adult Health

## 2020-12-29 ENCOUNTER — Other Ambulatory Visit: Payer: Self-pay

## 2020-12-29 VITALS — BP 106/70 | HR 54 | Temp 97.3°F | Wt 113.0 lb

## 2020-12-29 DIAGNOSIS — M48061 Spinal stenosis, lumbar region without neurogenic claudication: Secondary | ICD-10-CM

## 2020-12-29 DIAGNOSIS — R6889 Other general symptoms and signs: Secondary | ICD-10-CM

## 2020-12-29 DIAGNOSIS — D1803 Hemangioma of intra-abdominal structures: Secondary | ICD-10-CM

## 2020-12-29 DIAGNOSIS — Z23 Encounter for immunization: Secondary | ICD-10-CM | POA: Diagnosis not present

## 2020-12-29 DIAGNOSIS — R030 Elevated blood-pressure reading, without diagnosis of hypertension: Secondary | ICD-10-CM | POA: Diagnosis not present

## 2020-12-29 DIAGNOSIS — E039 Hypothyroidism, unspecified: Secondary | ICD-10-CM

## 2020-12-29 DIAGNOSIS — K219 Gastro-esophageal reflux disease without esophagitis: Secondary | ICD-10-CM

## 2020-12-29 DIAGNOSIS — M810 Age-related osteoporosis without current pathological fracture: Secondary | ICD-10-CM | POA: Diagnosis not present

## 2020-12-29 DIAGNOSIS — E559 Vitamin D deficiency, unspecified: Secondary | ICD-10-CM

## 2020-12-29 DIAGNOSIS — R7309 Other abnormal glucose: Secondary | ICD-10-CM | POA: Diagnosis not present

## 2020-12-29 DIAGNOSIS — L719 Rosacea, unspecified: Secondary | ICD-10-CM

## 2020-12-29 DIAGNOSIS — F3341 Major depressive disorder, recurrent, in partial remission: Secondary | ICD-10-CM

## 2020-12-29 DIAGNOSIS — Z0001 Encounter for general adult medical examination with abnormal findings: Secondary | ICD-10-CM

## 2020-12-29 DIAGNOSIS — K5730 Diverticulosis of large intestine without perforation or abscess without bleeding: Secondary | ICD-10-CM

## 2020-12-29 DIAGNOSIS — Z0000 Encounter for general adult medical examination without abnormal findings: Secondary | ICD-10-CM

## 2020-12-29 DIAGNOSIS — J449 Chronic obstructive pulmonary disease, unspecified: Secondary | ICD-10-CM | POA: Diagnosis not present

## 2020-12-29 DIAGNOSIS — E782 Mixed hyperlipidemia: Secondary | ICD-10-CM | POA: Diagnosis not present

## 2020-12-29 NOTE — Addendum Note (Signed)
Addended by: Chancy Hurter on: 12/29/2020 03:54 PM   Modules accepted: Orders

## 2020-12-29 NOTE — Addendum Note (Signed)
Addended by: Chancy Hurter on: 12/29/2020 04:45 PM   Modules accepted: Orders

## 2020-12-29 NOTE — Patient Instructions (Addendum)
   Ms. Kolodny , Thank you for taking time to come for your Medicare Wellness Visit. I appreciate your ongoing commitment to your health goals. Please review the following plan we discussed and let me know if I can assist you in the future.   These are the goals we discussed: Goals    . Exercise 150 min/wk Moderate Activity       This is a list of the screening recommended for you and due dates:  Health Maintenance  Topic Date Due  . Mammogram  03/18/2020  . Tetanus Vaccine  08/28/2020  . Pneumonia vaccines (2 of 2 - PPSV23) 03/31/2021*  . Flu Shot  03/28/2021  . Colon Cancer Screening  08/28/2024  . DEXA scan (bone density measurement)  Completed  . COVID-19 Vaccine  Completed  .  Hepatitis C: One time screening is recommended by Center for Disease Control  (CDC) for  adults born from [birth year] through 1963.   Completed  . HPV Vaccine  Aged Out  *Topic was postponed. The date shown is not the original due date.    Please ask Dr. Hilarie Fredrickson about liver hemangioma and follow up recommendations   Please ask GYN for forward any pap/bone density report

## 2020-12-30 LAB — COMPLETE METABOLIC PANEL WITH GFR
AG Ratio: 1.7 (calc) (ref 1.0–2.5)
ALT: 24 U/L (ref 6–29)
AST: 28 U/L (ref 10–35)
Albumin: 4.3 g/dL (ref 3.6–5.1)
Alkaline phosphatase (APISO): 45 U/L (ref 37–153)
BUN: 20 mg/dL (ref 7–25)
CO2: 24 mmol/L (ref 20–32)
Calcium: 9.5 mg/dL (ref 8.6–10.4)
Chloride: 103 mmol/L (ref 98–110)
Creat: 0.69 mg/dL (ref 0.50–0.99)
GFR, Est African American: 104 mL/min/{1.73_m2} (ref >=60)
GFR, Est Non African American: 90 mL/min/{1.73_m2} (ref >=60)
Globulin: 2.6 g/dL (calc) (ref 1.9–3.7)
Glucose, Bld: 133 mg/dL — ABNORMAL HIGH (ref 65–99)
Potassium: 4.7 mmol/L (ref 3.5–5.3)
Sodium: 142 mmol/L (ref 135–146)
Total Bilirubin: 0.5 mg/dL (ref 0.2–1.2)
Total Protein: 6.9 g/dL (ref 6.1–8.1)

## 2020-12-30 LAB — LIPID PANEL
Cholesterol: 187 mg/dL (ref <200)
HDL: 69 mg/dL (ref >=50)
LDL Cholesterol (Calc): 99 mg/dL (calc)
Non-HDL Cholesterol (Calc): 118 mg/dL (calc) (ref <130)
Total CHOL/HDL Ratio: 2.7 (calc) (ref <5.0)
Triglycerides: 94 mg/dL (ref <150)

## 2020-12-30 LAB — CBC WITH DIFFERENTIAL/PLATELET
Absolute Monocytes: 418 cells/uL (ref 200–950)
Basophils Absolute: 58 cells/uL (ref 0–200)
Basophils Relative: 1 %
Eosinophils Absolute: 70 cells/uL (ref 15–500)
Eosinophils Relative: 1.2 %
HCT: 39.9 % (ref 35.0–45.0)
Hemoglobin: 13.1 g/dL (ref 11.7–15.5)
Lymphs Abs: 1879 cells/uL (ref 850–3900)
MCH: 31.6 pg (ref 27.0–33.0)
MCHC: 32.8 g/dL (ref 32.0–36.0)
MCV: 96.1 fL (ref 80.0–100.0)
MPV: 10.1 fL (ref 7.5–12.5)
Monocytes Relative: 7.2 %
Neutro Abs: 3376 cells/uL (ref 1500–7800)
Neutrophils Relative %: 58.2 %
Platelets: 266 10*3/uL (ref 140–400)
RBC: 4.15 10*6/uL (ref 3.80–5.10)
RDW: 11.5 % (ref 11.0–15.0)
Total Lymphocyte: 32.4 %
WBC: 5.8 10*3/uL (ref 3.8–10.8)

## 2020-12-30 LAB — TSH: TSH: 1.08 mIU/L (ref 0.40–4.50)

## 2020-12-30 LAB — MAGNESIUM: Magnesium: 2.3 mg/dL (ref 1.5–2.5)

## 2021-01-12 ENCOUNTER — Other Ambulatory Visit: Payer: Self-pay | Admitting: Internal Medicine

## 2021-01-17 ENCOUNTER — Other Ambulatory Visit: Payer: Self-pay

## 2021-01-17 ENCOUNTER — Ambulatory Visit (AMBULATORY_SURGERY_CENTER): Payer: Medicare Other | Admitting: Internal Medicine

## 2021-01-17 ENCOUNTER — Encounter: Payer: Self-pay | Admitting: Internal Medicine

## 2021-01-17 VITALS — BP 104/69 | HR 60 | Temp 97.8°F | Resp 16 | Ht 61.0 in | Wt 112.0 lb

## 2021-01-17 DIAGNOSIS — R768 Other specified abnormal immunological findings in serum: Secondary | ICD-10-CM

## 2021-01-17 DIAGNOSIS — R140 Abdominal distension (gaseous): Secondary | ICD-10-CM

## 2021-01-17 DIAGNOSIS — Z1211 Encounter for screening for malignant neoplasm of colon: Secondary | ICD-10-CM | POA: Diagnosis not present

## 2021-01-17 DIAGNOSIS — K2970 Gastritis, unspecified, without bleeding: Secondary | ICD-10-CM | POA: Diagnosis not present

## 2021-01-17 DIAGNOSIS — K319 Disease of stomach and duodenum, unspecified: Secondary | ICD-10-CM | POA: Diagnosis not present

## 2021-01-17 DIAGNOSIS — R109 Unspecified abdominal pain: Secondary | ICD-10-CM

## 2021-01-17 DIAGNOSIS — K3189 Other diseases of stomach and duodenum: Secondary | ICD-10-CM | POA: Diagnosis not present

## 2021-01-17 MED ORDER — SODIUM CHLORIDE 0.9 % IV SOLN: 500.0000 mL | INTRAVENOUS | Status: DC

## 2021-01-17 NOTE — Patient Instructions (Signed)
Handouts provided on gastritis and diverticulosis.   Repeat colonoscopy in 10 years for screening purposes.   YOU HAD AN ENDOSCOPIC PROCEDURE TODAY AT [facility]:   Refer to the procedure report that was given to you for any specific questions about what was found during the examination.  If the procedure report does not answer your questions, please call your gastroenterologist to clarify.  If you requested that your care partner not be given the details of your procedure findings, then the procedure report has been included in a sealed envelope for you to review at your convenience later.  YOU SHOULD EXPECT: Some feelings of bloating in the abdomen. Passage of more gas than usual.  Walking can help get rid of the air that was put into your GI tract during the procedure and reduce the bloating. If you had a lower endoscopy (such as a colonoscopy or flexible sigmoidoscopy) you may notice spotting of blood in your stool or on the toilet paper. If you underwent a bowel prep for your procedure, you may not have a normal bowel movement for a few days.  Please Note:  You might notice some irritation and congestion in your nose or some drainage.  This is from the oxygen used during your procedure.  There is no need for concern and it should clear up in a day or so.  SYMPTOMS TO REPORT IMMEDIATELY:   Following lower endoscopy (colonoscopy or flexible sigmoidoscopy):  Excessive amounts of blood in the stool  Significant tenderness or worsening of abdominal pains  Swelling of the abdomen that is new, acute  Fever of 100F or higher   Following upper endoscopy (EGD)  Vomiting of blood or coffee ground material  New chest pain or pain under the shoulder blades  Painful or persistently difficult swallowing  New shortness of breath  Fever of 100F or higher  Black, tarry-looking stools  For urgent or emergent issues, a gastroenterologist can be reached at any hour by calling [phone]  [phone]. Do not use MyChart messaging for urgent concerns.    DIET:  We do recommend a small meal at first, but then you may proceed to your regular diet.  Drink plenty of fluids but you should avoid alcoholic beverages for 24 hours.  ACTIVITY:  You should plan to take it easy for the rest of today and you should NOT DRIVE or use heavy machinery until tomorrow (because of the sedation medicines used during the test).    FOLLOW UP: Our staff will call the number listed on your records 48-72 hours following your procedure to check on you and address any questions or concerns that you may have regarding the information given to you following your procedure. If we do not reach you, we will leave a message.  We will attempt to reach you two times.  During this call, we will ask if you have developed any symptoms of COVID 19. If you develop any symptoms (ie: fever, flu-like symptoms, shortness of breath, cough etc.) before then, please call [phone].  If you test positive for Covid 19 in the 2 weeks post procedure, please call and report this information to [place].    If any biopsies were taken you will be contacted by phone or by letter within the next 1-3 weeks.  Please call us at [phone] if you have not heard about the biopsies in 3 weeks.    SIGNATURES/CONFIDENTIALITY: You and/or your care partner have signed paperwork which will be entered  into your electronic medical record.  These signatures attest to the fact that that the information above on your After Visit Summary has been reviewed and is understood.  Full responsibility of the confidentiality of this discharge information lies with you and/or your care-partner.

## 2021-01-17 NOTE — Progress Notes (Signed)
Pt's states no medical or surgical changes since previsit or office visit. 

## 2021-01-17 NOTE — Op Note (Signed)
[facility] Patient Name: Isabel Tyler Procedure Date: 01/17/2021 2:48 PM MRN: [id number] Endoscopist: Jerene Bears , MD Age: 70 Referring MD:  Date of Birth: 1951-03-19 Gender: Female Account #: [id number] Procedure:                Colonoscopy Indications:              Screening for colorectal malignant neoplasm Medicines:                Monitored Anesthesia Care Procedure:                Pre-Anesthesia Assessment:                           - Prior to the procedure, a History and Physical                            was performed, and patient medications and                            allergies were reviewed. The patient's tolerance of                            previous anesthesia was also reviewed. The risks                            and benefits of the procedure and the sedation                            options and risks were discussed with the patient.                            All questions were answered, and informed consent                            was obtained. Prior Anticoagulants: The patient has                            taken no previous anticoagulant or antiplatelet                            agents. ASA Grade Assessment: II - A patient with                            mild systemic disease. After reviewing the risks                            and benefits, the patient was deemed in                            satisfactory condition to undergo the procedure.                           After obtaining informed consent, the colonoscope  was passed under direct vision. Throughout the                            procedure, the patient's blood pressure, pulse, and                            oxygen saturations were monitored continuously. The                            Olympus PCF-H190DL (#[id number]) Colonoscope was                            introduced through the anus and advanced to the                            cecum, identified by  appendiceal orifice and                            ileocecal valve. The colonoscopy was performed                            without difficulty. The patient tolerated the                            procedure well. The quality of the bowel                            preparation was good. The ileocecal valve,                            appendiceal orifice, and rectum were photographed. Scope In: 3:15:38 PM Scope Out: 3:30:21 PM Scope Withdrawal Time: 0 hours 10 minutes 58 seconds  Total Procedure Duration: 0 hours 14 minutes 43 seconds  Findings:                 The digital rectal exam was normal.                           Multiple small and large-mouthed diverticula were                            found in the sigmoid colon.                           The exam was otherwise without abnormality on                            direct and retroflexion views. Complications:            No immediate complications. Estimated Blood Loss:     Estimated blood loss: none. Impression:               - Diverticulosis in the sigmoid colon.                           - The examination was otherwise normal on  direct                            and retroflexion views.                           - No specimens collected. Recommendation:           - Patient has a contact number available for                            emergencies. The signs and symptoms of potential                            delayed complications were discussed with the                            patient. Return to normal activities tomorrow.                            Written discharge instructions were provided to the                            patient.                           - Resume previous diet.                           - Continue present medications.                           - Repeat colonoscopy in 10 years for screening                            purposes. Jerene Bears, MD 01/17/2021 3:35:30 PM This report has been signed  electronically.

## 2021-01-17 NOTE — Progress Notes (Signed)
Called to room to assist during endoscopic procedure.  Patient ID and intended procedure confirmed with present staff. Received instructions for my participation in the procedure from the performing physician.  

## 2021-01-17 NOTE — Op Note (Signed)
[facility] Patient Name: Isabel Tyler Procedure Date: 01/17/2021 2:55 PM MRN: [id number] Endoscopist: Jerene Bears , MD Age: 70 Referring MD:  Date of Birth: Jan 02, 1951 Gender: Female Account #: [id number] Procedure:                Upper GI endoscopy Indications:              Upper abdominal pressure, Positive celiac                            serologies (+TTG) not on gluten free diet,                            Eructation Medicines:                Monitored Anesthesia Care Procedure:                Pre-Anesthesia Assessment:                           - Prior to the procedure, a History and Physical                            was performed, and patient medications and                            allergies were reviewed. The patient's tolerance of                            previous anesthesia was also reviewed. The risks                            and benefits of the procedure and the sedation                            options and risks were discussed with the patient.                            All questions were answered, and informed consent                            was obtained. Prior Anticoagulants: The patient has                            taken no previous anticoagulant or antiplatelet                            agents. ASA Grade Assessment: II - A patient with                            mild systemic disease. After reviewing the risks                            and benefits, the patient was deemed in  satisfactory condition to undergo the procedure.                           After obtaining informed consent, the endoscope was                            passed under direct vision. Throughout the                            procedure, the patient's blood pressure, pulse, and                            oxygen saturations were monitored continuously. The                            Endoscope was introduced through the mouth, and                             advanced to the second part of duodenum. The upper                            GI endoscopy was accomplished without difficulty.                            The patient tolerated the procedure well. Scope In: Scope Out: Findings:                 The examined esophagus was normal.                           Moderate inflammation characterized by adherent                            blood and erythema was found in the gastric body                            and in the gastric antrum. Biopsies were taken with                            a cold forceps for histology and Helicobacter                            pylori testing.                           The cardia and gastric fundus were normal on                            retroflexion.                           Diffuse mucosal flattening was found in the second                            portion of the duodenum. Biopsies for histology  were taken with a cold forceps for evaluation of                            celiac disease. Complications:            No immediate complications. Estimated Blood Loss:     Estimated blood loss was minimal. Impression:               - Normal esophagus.                           - Gastritis. Biopsied.                           - Flattened mucosa was found in the duodenum, rule                            out celiac disease. Biopsied. Recommendation:           - Patient has a contact number available for                            emergencies. The signs and symptoms of potential                            delayed complications were discussed with the                            patient. Return to normal activities tomorrow.                            Written discharge instructions were provided to the                            patient.                           - Resume previous diet.                           - Continue present medications.                           - Await  pathology results. Jerene Bears, MD 01/17/2021 3:34:00 PM This report has been signed electronically.

## 2021-01-17 NOTE — Progress Notes (Signed)
pt tolerated well. VSS. awake and to recovery. Report given to RN.  

## 2021-01-18 NOTE — Progress Notes (Signed)
We have attempted on multiple occasions to get records from patient's previous GI physician, Dr Osvaldo Human at [facility] (phone [phone], fax [phone]). Unfortunately, after requesting records on 10/04/20 11:40 am, 11/10/20 9:53 am and 12/15/20 @3 :31 pm, then again on 12/15/20 at 3:37 pm after being told by Vinnie Level with Dr Osvaldo Human that she would resend to [facility] since she could tell in their old system that the patient had last been seen 07/14/2015, we still have gotten no records and have been sent notes that no records have been found on the patient. Per Dr Hilarie Fredrickson, we can abort our endeavors to get these records as we have now completed our own endoscopy/colonoscopy on patient and can keep up with any new symptoms at this point forward.

## 2021-01-19 ENCOUNTER — Telehealth: Payer: Self-pay | Admitting: *Deleted

## 2021-01-19 NOTE — Telephone Encounter (Signed)
  Follow up Call-  Call back number 01/17/2021  Post procedure Call Back phone  # [phone]  Permission to leave phone message Yes  Some recent data might be hidden     Patient questions:   Do you have a fever, pain , or abdominal swelling? No. Pain Score  0 *  Have you tolerated food without any problems? Yes.    Have you been able to return to your normal activities? Yes.    Do you have any questions about your discharge instructions: Diet   No. Medications  No. Follow up visit  No.  Do you have questions or concerns about your Care? No.  Actions: * If pain score is 4 or above: No action needed, pain <4.  1. Have you developed a fever since your procedure? No   2.   Have you had an respiratory symptoms (SOB or cough) since your procedure? no  3.   Have you tested positive for COVID 19 since your procedure no  4.   Have you had any family members/close contacts diagnosed with the COVID 19 since your procedure?  no   If yes to any of these questions please route to Joylene John, RN and Joella Prince, RN

## 2021-02-01 ENCOUNTER — Telehealth: Payer: Self-pay | Admitting: Internal Medicine

## 2021-02-01 NOTE — Telephone Encounter (Signed)
Pt calling for path results. Looks like you tried to call her.

## 2021-02-01 NOTE — Telephone Encounter (Signed)
Just called pt and reached her voicemail. I will try her again tomorrow

## 2021-02-01 NOTE — Telephone Encounter (Signed)
Patient called and requested to speak with a nurse regarding her lab results.

## 2021-02-01 NOTE — Telephone Encounter (Signed)
Noted  

## 2021-02-02 ENCOUNTER — Other Ambulatory Visit: Payer: Self-pay | Admitting: Internal Medicine

## 2021-02-02 DIAGNOSIS — K2970 Gastritis, unspecified, without bleeding: Secondary | ICD-10-CM

## 2021-02-02 DIAGNOSIS — R768 Other specified abnormal immunological findings in serum: Secondary | ICD-10-CM

## 2021-02-02 MED ORDER — PANTOPRAZOLE SODIUM 40 MG PO TBEC: 40.0000 mg | DELAYED_RELEASE_TABLET | Freq: Every day | ORAL | 0 refills | Status: DC

## 2021-03-03 ENCOUNTER — Other Ambulatory Visit: Payer: Medicare Other

## 2021-03-03 DIAGNOSIS — R768 Other specified abnormal immunological findings in serum: Secondary | ICD-10-CM | POA: Diagnosis not present

## 2021-03-03 NOTE — Addendum Note (Signed)
Addended by: Octavio Manns E on: 03/03/2021 10:21 AM   Modules accepted: Orders

## 2021-03-04 LAB — IGA: Immunoglobulin A: 362 mg/dL — ABNORMAL HIGH (ref 70–320)

## 2021-03-04 LAB — TISSUE TRANSGLUTAMINASE, IGA: (tTG) Ab, IgA: 11 U/mL

## 2021-04-04 ENCOUNTER — Encounter: Payer: Self-pay | Admitting: Internal Medicine

## 2021-04-04 ENCOUNTER — Other Ambulatory Visit: Payer: Self-pay

## 2021-04-04 ENCOUNTER — Ambulatory Visit (INDEPENDENT_AMBULATORY_CARE_PROVIDER_SITE_OTHER): Payer: Medicare Other | Admitting: Internal Medicine

## 2021-04-04 VITALS — BP 120/78 | HR 69 | Temp 97.9°F | Resp 17 | Ht 61.0 in | Wt 115.0 lb

## 2021-04-04 DIAGNOSIS — E782 Mixed hyperlipidemia: Secondary | ICD-10-CM | POA: Diagnosis not present

## 2021-04-04 DIAGNOSIS — R7309 Other abnormal glucose: Secondary | ICD-10-CM

## 2021-04-04 DIAGNOSIS — Z79899 Other long term (current) drug therapy: Secondary | ICD-10-CM | POA: Diagnosis not present

## 2021-04-04 DIAGNOSIS — E039 Hypothyroidism, unspecified: Secondary | ICD-10-CM | POA: Diagnosis not present

## 2021-04-04 DIAGNOSIS — I498 Other specified cardiac arrhythmias: Secondary | ICD-10-CM | POA: Diagnosis not present

## 2021-04-04 DIAGNOSIS — E559 Vitamin D deficiency, unspecified: Secondary | ICD-10-CM | POA: Diagnosis not present

## 2021-04-04 DIAGNOSIS — R0989 Other specified symptoms and signs involving the circulatory and respiratory systems: Secondary | ICD-10-CM | POA: Diagnosis not present

## 2021-04-04 DIAGNOSIS — K219 Gastro-esophageal reflux disease without esophagitis: Secondary | ICD-10-CM | POA: Diagnosis not present

## 2021-04-04 MED ORDER — ROSUVASTATIN CALCIUM 20 MG PO TABS: ORAL_TABLET | ORAL | 3 refills | Status: DC

## 2021-04-04 NOTE — Patient Instructions (Signed)

## 2021-04-04 NOTE — Progress Notes (Signed)
Future Appointments  Date Time Provider [place]  04/04/2021  - 6 mo OV   2:30 PM Unk Pinto, MD [facility] None  07/12/2021  1:20 PM Fay Records, MD [facility]  09/21/2021  - CPE 10:00 AM Unk Pinto, MD [facility] None  12/29/2021  - Wellness  2:30 PM Liane Comber, NP [facility] None    History of Present Illness:       This very nice 70 y.o. MWF presents for 6  month follow up with HTN, HLD, HypothyroidismPre-Diabetes and Vitamin D Deficiency.  Patient has had recent gastric bx's by Dr Hilarie Fredrickson to rule out Celiac Dz.        Patient is followed expectantly for labile HTN (2000) & BP has been controlled at home. Today's BP is at goal - 120/78.  Negative stress Myoview in 2015.  Patient has recently been evaluated by Dr Dorris Carnes for a ? of Afib which she felt by EKG represented Atrial bigeminy & did not recc anticoagulant  meds. Patient has had no complaints of any cardiac type chest pain, palpitations, dyspnea / orthopnea / PND, dizziness, claudication, or dependent edema.       Hyperlipidemia is controlled with diet & Rosuvastatin.   Patient denies myalgias or other med SE's. Last Lipids were at goal:  Lab Results  Component Value Date   CHOL 187 12/29/2020   HDL 69 12/29/2020   LDLCALC 99 12/29/2020   TRIG 94 12/29/2020   CHOLHDL 2.7 12/29/2020     Also, the patient has been monitored for glucose intolerance and has had no symptoms of reactive hypoglycemia, diabetic polys, paresthesias or visual blurring.  Last A1c was normal & at goal:  Lab Results  Component Value Date   HGBA1C 5.4 09/10/2020                                                                                               Patient has been  on Thyroid Replacement since dx'd Hypothyroid in 2000.   Further, the patient also has history of Vitamin D Deficiency and supplements vitamin D without any suspected side-effects. Last vitamin D was at goal:  Lab Results   Component Value Date   VD25OH 96 09/10/2020     Current Outpatient Medications on File Prior to Visit  Medication Sig   alendronate (FOSAMAX) 70 MG tablet TAKE 1 TABLET  EVERY WEEK I   Ascorbic Acid (VITAMIN C) 1000 MG tablet Take 1,000 mg by mouth daily.   aspirin EC 81 MG tablet Take daily.   Calcium Citrate (CITRACAL PO) Take 1 tablet daily.   cetirizine (ZYRTEC) 10 MG tablet Take daily.   Cholecalciferol (VITAMIN D-3) 5000 units TABS Take 1 tablet daily.   escitalopram (LEXAPRO) 20 MG tablet TAKE 1 TABLET  DAILY   FLAXSEED OIL1200 MG CAPS Take 1 capsule daily.   levothyroxine  75 MCG tablet Take  1 tablet Daily   metoprolol succinate XL 25 MG  Take 0.5 tablets (12.5 mg total) by mouth daily.   PROBIOTIC-10 Take 1 capsule by mouth daily.   rosuvastatin  40 MG tablet Take  1 tablet  Daily  for Cholesterol   TURMERIC  Take 1,400 mg by mouth daily.   Current Facility-Administered Medications on File Prior to Visit  Medication   dexamethasone (DECADRON) injection 10 mg     Allergies  Allergen Reactions   Ciprofloxacin Other (See Comments)    Alleged tendonitis   Z-Pak [Azithromycin] Other (See Comments)    Patient reports that the reaction was GI upset issues.     PMHx:   Past Medical History:  Diagnosis Date   Anxiety    Diverticular disease    GERD (gastroesophageal reflux disease)    Hx of diverticulitis of colon 04/04/2017   Hyperlipidemia, mixed    Hypothyroidism 2000   Vitamin D deficiency      Immunization History  Administered Date(s) Administered   Influenza Inj Mdck Quad With Preservative 07/27/2017   Influenza, High Dose Seasonal PF 07/16/2018   PFIZER(Purple Top)SARS-COV-2 Vaccination 10/04/2019, 10/30/2019, 06/28/2020   PPD Test 04/05/2017   Pneumococcal Conjugate-13 06/06/2018   Td 12/29/2020   Tdap 08/28/2010     Past Surgical History:  Procedure Laterality Date   BREAST BIOPSY Left    benign   CHOLECYSTECTOMY N/A 02/08/2017   Procedure:  LAPAROSCOPIC CHOLECYSTECTOMY WITH INTRAOPERATIVE CHOLANGIOGRAM;  Surgeon: Donnie Mesa, MD;  Location: [facility];  Service: General;  Laterality: N/A;   EYE SURGERY     Eyelid lifted   TONSILLECTOMY      FHx:    Reviewed / unchanged  SHx:    Reviewed / unchanged   Systems Review:  Constitutional: Denies fever, chills, wt changes, headaches, insomnia, fatigue, night sweats, change in appetite. Eyes: Denies redness, blurred vision, diplopia, discharge, itchy, watery eyes.  ENT: Denies discharge, congestion, post nasal drip, epistaxis, sore throat, earache, hearing loss, dental pain, tinnitus, vertigo, sinus pain, snoring.  CV: Denies chest pain, palpitations, irregular heartbeat, syncope, dyspnea, diaphoresis, orthopnea, PND, claudication or edema. Respiratory: denies cough, dyspnea, DOE, pleurisy, hoarseness, laryngitis, wheezing.  Gastrointestinal: Denies dysphagia, odynophagia, heartburn, reflux, water brash, abdominal pain or cramps, nausea, vomiting, bloating, diarrhea, constipation, hematemesis, melena, hematochezia  or hemorrhoids. Genitourinary: Denies dysuria, frequency, urgency, nocturia, hesitancy, discharge, hematuria or flank pain. Musculoskeletal: Denies arthralgias, myalgias, stiffness, jt. swelling, pain, limping or strain/sprain.  Skin: Denies pruritus, rash, hives, warts, acne, eczema or change in skin lesion(s). Neuro: No weakness, tremor, incoordination, spasms, paresthesia or pain. Psychiatric: Denies confusion, memory loss or sensory loss. Endo: Denies change in weight, skin or hair change.  Heme/Lymph: No excessive bleeding, bruising or enlarged lymph nodes.  Physical Exam  BP 120/78   Pulse 69   Temp 97.9 F (36.6 C)   Resp 17   Ht '5\' 1"'$  (1.549 m)   Wt 115 lb (52.2 kg)   SpO2 97%   BMI 21.73 kg/m   Appears  well nourished, well groomed  and in no distress.  Eyes: PERRLA, EOMs, conjunctiva no swelling or erythema. Sinuses: No frontal/maxillary  tenderness ENT/Mouth: EAC's clear, TM's nl w/o erythema, bulging. Nares clear w/o erythema, swelling, exudates. Oropharynx clear without erythema or exudates. Oral hygiene is good. Tongue normal, non obstructing. Hearing intact.  Neck: Supple. Thyroid not palpable. Car 2+/2+ without bruits, nodes or JVD. Chest: Respirations nl with BS clear & equal w/o rales, rhonchi, wheezing or stridor.  Cor: Heart sounds normal w/ regular rate and rhythm without sig. murmurs, gallops, clicks or rubs. Peripheral pulses normal and equal  without edema.  Abdomen: Soft & bowel sounds normal. Non-tender w/o  guarding, rebound, hernias, masses or organomegaly.  Lymphatics: Unremarkable.  Musculoskeletal: Full ROM all peripheral extremities, joint stability, 5/5 strength and normal gait.  Skin: Warm, dry without exposed rashes, lesions or ecchymosis apparent.  Neuro: Cranial nerves intact, reflexes equal bilaterally. Sensory-motor testing grossly intact. Tendon reflexes grossly intact.  Pysch: Alert & oriented x 3.  Insight and judgement nl & appropriate. No ideations.  Assessment and Plan:  1. Labile hypertension  - Continue medication, monitor blood pressure at home.  - Continue DASH diet.  Reminder to go to the ER if any CP,  SOB, nausea, dizziness, severe HA, changes vision/speech.    - CBC with Differential/Platelet - COMPLETE METABOLIC PANEL WITH GFR - Magnesium - TSH  2. Hyperlipidemia, mixed  - Continue diet/meds, exercise,& lifestyle modifications.  - Continue monitor periodic cholesterol/liver & renal functions     - Lipid panel - TSH  3. Abnormal glucose  - Continue diet, exercise  - Lifestyle modifications.  - Monitor appropriate labs    - Hemoglobin A1c - Insulin, random  4. Vitamin D deficiency  - Continue supplementation    - VITAMIN D 25 Hydroxy   5. Gastroesophageal reflux disease  - CBC with Differential/Platelet  6. Hypothyroidism  - Hemoglobin A1c  7. Atrial  arrhythmia  - TSH  8. Medication management  - CBC with Differential/Platelet - COMPLETE METABOLIC PANEL WITH GFR - Magnesium - Lipid panel - TSH - Hemoglobin A1c - Insulin, random - VITAMIN D 25 Hydroxy         Discussed  regular exercise, BP monitoring, weight control to achieve/maintain BMI less than 25 and discussed med and SE's. Recommended labs to assess and monitor clinical status with further disposition pending results of labs.  I discussed the assessment and treatment plan with the patient. The patient was provided an opportunity to ask questions and all were answered. The patient agreed with the plan and demonstrated an understanding of the instructions.  I provided over 30 minutes of exam, counseling, chart review and  complex critical decision making.        The patient was advised to call back or seek an in-person evaluation if the symptoms worsen or if the condition fails to improve as anticipated.   Kirtland Bouchard, MD

## 2021-04-05 LAB — CBC WITH DIFFERENTIAL/PLATELET
Absolute Monocytes: 414 cells/uL (ref 200–950)
Basophils Absolute: 62 cells/uL (ref 0–200)
Basophils Relative: 1.1 %
Eosinophils Absolute: 39 cells/uL (ref 15–500)
Eosinophils Relative: 0.7 %
HCT: 40.3 % (ref 35.0–45.0)
Hemoglobin: 13.4 g/dL (ref 11.7–15.5)
Lymphs Abs: 1786 cells/uL (ref 850–3900)
MCH: 32.1 pg (ref 27.0–33.0)
MCHC: 33.3 g/dL (ref 32.0–36.0)
MCV: 96.4 fL (ref 80.0–100.0)
MPV: 10 fL (ref 7.5–12.5)
Monocytes Relative: 7.4 %
Neutro Abs: 3298 cells/uL (ref 1500–7800)
Neutrophils Relative %: 58.9 %
Platelets: 272 10*3/uL (ref 140–400)
RBC: 4.18 10*6/uL (ref 3.80–5.10)
RDW: 11.6 % (ref 11.0–15.0)
Total Lymphocyte: 31.9 %
WBC: 5.6 10*3/uL (ref 3.8–10.8)

## 2021-04-05 LAB — COMPLETE METABOLIC PANEL WITH GFR
AG Ratio: 1.7 (calc) (ref 1.0–2.5)
ALT: 15 U/L (ref 6–29)
AST: 20 U/L (ref 10–35)
Albumin: 4.5 g/dL (ref 3.6–5.1)
Alkaline phosphatase (APISO): 44 U/L (ref 37–153)
BUN: 17 mg/dL (ref 7–25)
CO2: 32 mmol/L (ref 20–32)
Calcium: 10.2 mg/dL (ref 8.6–10.4)
Chloride: 100 mmol/L (ref 98–110)
Creat: 0.71 mg/dL (ref 0.50–1.05)
Globulin: 2.7 g/dL (calc) (ref 1.9–3.7)
Glucose, Bld: 74 mg/dL (ref 65–99)
Potassium: 5 mmol/L (ref 3.5–5.3)
Sodium: 139 mmol/L (ref 135–146)
Total Bilirubin: 0.6 mg/dL (ref 0.2–1.2)
Total Protein: 7.2 g/dL (ref 6.1–8.1)
eGFR: 93 mL/min/{1.73_m2} (ref >=60)

## 2021-04-05 LAB — LIPID PANEL
Cholesterol: 181 mg/dL (ref <200)
HDL: 69 mg/dL (ref >=50)
LDL Cholesterol (Calc): 94 mg/dL (calc)
Non-HDL Cholesterol (Calc): 112 mg/dL (calc) (ref <130)
Total CHOL/HDL Ratio: 2.6 (calc) (ref <5.0)
Triglycerides: 88 mg/dL (ref <150)

## 2021-04-05 LAB — HEMOGLOBIN A1C
Hgb A1c MFr Bld: 5.4 % of total Hgb (ref <5.7)
Mean Plasma Glucose: 108 mg/dL
eAG (mmol/L): 6 mmol/L

## 2021-04-05 LAB — TSH: TSH: 0.36 mIU/L — ABNORMAL LOW (ref 0.40–4.50)

## 2021-04-05 LAB — MAGNESIUM: Magnesium: 2.1 mg/dL (ref 1.5–2.5)

## 2021-04-05 LAB — VITAMIN D 25 HYDROXY (VIT D DEFICIENCY, FRACTURES): Vit D, 25-Hydroxy: 77 ng/mL (ref 30–100)

## 2021-04-05 LAB — INSULIN, RANDOM: Insulin: 3.8 u[IU]/mL

## 2021-04-05 NOTE — Progress Notes (Signed)
============================================================ -   Test results slightly outside the reference range are not unusual. If there is anything important, I will review this with you,  otherwise it is considered normal test values.  If you have further questions,  please do not hesitate to contact me at the office or via My Chart.  ============================================================ ============================================================  -  Total Chol =       &       LDL Chol = 94      -       Both  Excellent   - Very low risk for Heart Attack  / Stroke ============================================================ ============================================================  -  Thyroid (TSH) is borderline out of range  but not significantly enough to  warrant a dosage change  -  Will continue to monitor closely  ============================================================ ============================================================  -  A1c - Normal - No Diabetes  - Great  ! ============================================================ ============================================================  -  Vitamin D = 77 - Excellent  ============================================================ ============================================================  -  All Else - CBC - Kidneys - Electrolytes - Liver - Magnesium & Thyroid    - all  Normal / OK ============================================================ ============================================================

## 2021-05-13 DIAGNOSIS — H5203 Hypermetropia, bilateral: Secondary | ICD-10-CM | POA: Diagnosis not present

## 2021-05-13 DIAGNOSIS — H353131 Nonexudative age-related macular degeneration, bilateral, early dry stage: Secondary | ICD-10-CM | POA: Diagnosis not present

## 2021-07-11 NOTE — Progress Notes (Signed)
Cardiology Office Note   Date:  07/12/2021   ID:  Isabel Tyler, DOB Jan 22, 1951, MRN [id number]  PCP:  Unk Pinto, MD  Cardiologist:   Dorris Carnes, MD   Patient presents for follow-up of palpitations.   History of Present Illness: Isabel Tyler is a 70 y.o. female with a history of HTN, HL, hypothyroidism,  Myovue x 2  Last in 2015 Normal  Followed by [name].  Seen in clinic on 09/10/20  No CP  No dizziness  No palpitations    EKG was done that reportedly showed atrial fibrillation The pt was placed on Xarelto and referred to cardiology    I saw the pt for the first time in Jan 2022.  EKG showed sinus rhythm with PACs (bigeminy) and also sinus rhythm.  I did not think there was atrial fibrillation.  She went on to have a monitor that showed sinus rhythm a couple short burst of SVT that lasted about a minute each.  Again no atrial fibrillation.  Rare PVC/PAC.  I recommended a low-dose of a beta-blocker.  In the interval she has felt okay.  Her breathing is good.  She denies palpitations.  No dizziness.  No chest tightness.  She is active.   Current Meds  Medication Sig   alendronate (FOSAMAX) 70 MG tablet TAKE 1 TABLET BY MOUTH EVERY WEEK IN THE MORNING ON AN EMPTY STOMACH WITH ONLY A GLASS OF WATER; NOTHING BY MOUTH FOR 1 HOUR   Ascorbic Acid (VITAMIN C) 1000 MG tablet Take 1,000 mg by mouth daily.   aspirin EC 81 MG tablet Take 81 mg by mouth daily.   Calcium Citrate (CITRACAL PO) Take 1 tablet by mouth daily.   cetirizine (ZYRTEC) 10 MG tablet Take 10 mg by mouth daily.   Cholecalciferol (VITAMIN D-3) 5000 units TABS Take 1 tablet by mouth daily.   escitalopram (LEXAPRO) 20 MG tablet TAKE 1 TABLET BY MOUTH DAILY   Flaxseed, Linseed, (FLAXSEED OIL) 1200 MG CAPS Take 1 capsule by mouth daily.   levothyroxine (SYNTHROID) 75 MCG tablet Take      1 tablet Daily        on an Empty Stomach      with only water for 30 minutes  & No Antacid meds, Calcium or Magnesium  for 4 hours and AVOID Biotin   metoprolol succinate (TOPROL XL) 25 MG 24 hr tablet Take 0.5 tablets (12.5 mg total) by mouth daily.   Probiotic Product (PROBIOTIC-10) CAPS Take 1 capsule by mouth daily.   rosuvastatin (CRESTOR) 20 MG tablet Take  1 tablet  3 x  /week  for Cholesterol   Current Facility-Administered Medications for the 07/12/21 encounter (Office Visit) with Fay Records, MD  Medication   dexamethasone (DECADRON) injection 10 mg     Allergies:   Ciprofloxacin and Z-pak [azithromycin]   Past Medical History:  Diagnosis Date   Anxiety    Diverticular disease    GERD (gastroesophageal reflux disease)    Hx of diverticulitis of colon 04/04/2017   Hyperlipidemia, mixed    Hypothyroidism 2000   Vitamin D deficiency     Past Surgical History:  Procedure Laterality Date   BREAST BIOPSY Left    benign   CHOLECYSTECTOMY N/A 02/08/2017   Procedure: LAPAROSCOPIC CHOLECYSTECTOMY WITH INTRAOPERATIVE CHOLANGIOGRAM;  Surgeon: Donnie Mesa, MD;  Location: [facility];  Service: General;  Laterality: N/A;   EYE SURGERY     Eyelid lifted   TONSILLECTOMY  Social History:  The patient  reports that she has never smoked. She has never used smokeless tobacco. She reports current alcohol use. She reports that she does not use drugs.   Family History:  The patient's family history includes Breast cancer in her sister; Heart disease in her father and mother.    ROS:  Please see the history of present illness. All other systems are reviewed and  Negative to the above problem except as noted.    PHYSICAL EXAM: VS:  BP 98/64   Pulse 67   Ht 5' 1.5" (1.562 m)   Wt 113 lb (51.3 kg)   SpO2 98%   BMI 21.01 kg/m   GEN: Well nourished, well developed, in no acute distress  HEENT: normal  Neck: no JVD, no carotid bruits Cardiac:  RRR  No W3  ; no murmurs  No LE  edema  Respiratory:  clear to auscultation bilaterally, GI: soft, nontender, nondistended, + BS  No hepatomegaly  MS: no  deformity Moving all extremities   Skin: warm and dry, no rash Neuro:  Strength and sensation are intact Psych: euthymic mood, full affect   EKG:  EKG is not ordered today.   Lipid Panel    Component Value Date/Time   CHOL 181 04/04/2021 1429   TRIG 88 04/04/2021 1429   HDL 69 04/04/2021 1429   CHOLHDL 2.6 04/04/2021 1429   VLDL 13 04/05/2017 1046   LDLCALC 94 04/04/2021 1429      Wt Readings from Last 3 Encounters:  07/12/21 113 lb (51.3 kg)  04/04/21 115 lb (52.2 kg)  01/17/21 112 lb (50.8 kg)      ASSESSMENT AND PLAN:  1  Rhythm   I saw the in Feb   She was in atrial bigeminy at the time/this was transient as she then in SR  Monitor after showed rare PAC, PVC  along with short bursts( 1 min) of SVT   No afib   She is off of anticoagulation now   No symptoms  On very low dose toprol  Tolerating  Exam today HR is regular    I would follow for now   WIll see next fall  2  ? Hx of MVP   No MVP on echo in Feb 2022  3  HTN  Excellent  Denies dizziness   4  HL   On Crestor   LDL 94  HDL 69        Current medicines are reviewed at length with the patient today.  The patient does not have concerns regarding medicines.  Signed, Dorris Carnes, MD  07/12/2021 1:30 PM    [address], [place], [place]  [address] Phone: [phone]; Fax: [phone]

## 2021-07-12 ENCOUNTER — Ambulatory Visit (INDEPENDENT_AMBULATORY_CARE_PROVIDER_SITE_OTHER): Payer: Medicare Other | Admitting: Internal Medicine

## 2021-07-12 ENCOUNTER — Other Ambulatory Visit: Payer: Self-pay

## 2021-07-12 ENCOUNTER — Encounter: Payer: Self-pay | Admitting: Internal Medicine

## 2021-07-12 VITALS — BP 98/64 | HR 67 | Ht 61.5 in | Wt 113.0 lb

## 2021-07-12 DIAGNOSIS — R002 Palpitations: Secondary | ICD-10-CM | POA: Diagnosis not present

## 2021-07-12 NOTE — Patient Instructions (Addendum)
Medication Instructions:  NO CHANGES *If you need a refill on your cardiac medications before your next appointment, please call your pharmacy*   Lab Work: NONE If you have labs (blood work) drawn today and your tests are completely normal, you will receive your results only by: [place] (if you have MyChart) OR A paper copy in the mail If you have any lab test that is abnormal or we need to change your treatment, we will call you to review the results.   Testing/Procedures: NONE   Follow-Up: At [facility], you and your health needs are our priority.  As part of our continuing mission to provide you with exceptional heart care, we have created designated Provider Care Teams.  These Care Teams include your primary Cardiologist (physician) and Advanced Practice Providers (APPs -  Physician Assistants and Nurse Practitioners) who all work together to provide you with the care you need, when you need it.  We recommend signing up for the patient portal called "MyChart".  Sign up information is provided on this After Visit Summary.  MyChart is used to connect with patients for Virtual Visits (Telemedicine).  Patients are able to view lab/test results, encounter notes, upcoming appointments, etc.  Non-urgent messages can be sent to your provider as well.   To learn more about what you can do with MyChart, go to [email or web address].    Your next appointment:   10 month(s)  The format for your next appointment:   In Person  Provider:   Dorris Carnes, MD     Other Instructions NONE

## 2021-07-26 ENCOUNTER — Ambulatory Visit (INDEPENDENT_AMBULATORY_CARE_PROVIDER_SITE_OTHER): Payer: Medicare Other

## 2021-07-26 ENCOUNTER — Other Ambulatory Visit: Payer: Self-pay

## 2021-07-26 VITALS — Temp 97.3°F

## 2021-07-26 DIAGNOSIS — Z1231 Encounter for screening mammogram for malignant neoplasm of breast: Secondary | ICD-10-CM | POA: Diagnosis not present

## 2021-07-26 DIAGNOSIS — Z124 Encounter for screening for malignant neoplasm of cervix: Secondary | ICD-10-CM | POA: Diagnosis not present

## 2021-07-26 DIAGNOSIS — Z6821 Body mass index (BMI) 21.0-21.9, adult: Secondary | ICD-10-CM | POA: Diagnosis not present

## 2021-07-26 DIAGNOSIS — Z23 Encounter for immunization: Secondary | ICD-10-CM | POA: Diagnosis not present

## 2021-07-26 LAB — HM MAMMOGRAPHY

## 2021-07-26 LAB — HM PAP SMEAR: HM Pap smear: NEGATIVE

## 2021-07-26 NOTE — Progress Notes (Signed)
Patient requesting regular flu rather than the HD due to not feeling well the last time she received it.

## 2021-07-27 LAB — HM PAP SMEAR: HM Pap smear: NORMAL

## 2021-07-28 ENCOUNTER — Ambulatory Visit: Payer: Medicare Other

## 2021-09-20 ENCOUNTER — Encounter: Payer: Self-pay | Admitting: Internal Medicine

## 2021-09-20 NOTE — Progress Notes (Signed)
Comprehensive Evaluation &  Examination   Future Appointments  Date Time Provider Department  09/21/2021 10:00 AM Unk Pinto, MD [facility]  12/29/2021  2:30 PM Liane Comber, NP [facility]  09/26/2022 10:00 AM Unk Pinto, MD [facility]        This very nice 71 y.o. MWF presents for a comprehensive evaluation and management of multiple medical co-morbidities.  Patient has been followed for HTN, HLD, Prediabetes  and Vitamin D Deficiency.  Patient has been stable  on Lexapro for  Dysthymia (2003) .         Labile HTN predates since  2000.  Patient has hx /o neg stress tests x 2 in 2000 & 2015. Patient's BP has been controlled at home and patient denies any cardiac symptoms as chest pain, palpitations, shortness of breath, dizziness or ankle swelling. Today's BP is at goal - 110/68 .       Patient's hyperlipidemia is controlled with diet and g Crestor. Patient denies myalgias or other medication SE's. Last lipids were at goal :  Lab Results  Component Value Date   CHOL 181 04/04/2021   HDL 69 04/04/2021   LDLCALC 94 04/04/2021   TRIG 88 04/04/2021   CHOLHDL 2.6 04/04/2021        Patient was initiated on Thyroid Replacement in 2000.       Patient  is monitored expectantly for glucose intolerance  and patient denies reactive hypoglycemic symptoms, visual blurring, diabetic polys or paresthesias. Last A1c was normal & at goal :  Lab Results  Component Value Date   HGBA1C 5.4 04/04/2021         Finally, patient has history of Vitamin D Deficiency ("25" /2017) and last Vitamin D was at goal :  Lab Results  Component Value Date   VD25OH 77 04/04/2021     Current Outpatient Medications on File Prior to Visit  Medication Sig   alendronate  70 MG tablet TAKE 1 TABLET  EVERY WEEK    Ascorbic Acid (VITAMIN C) 1000 MG tablet Take 1,000 mg by mouth daily.   aspirin EC 81 MG tablet Take daily.   Calcium Citrate (CITRACAL ) Take 1 tablet by mouth daily.   cetirizine10  MG tablet Take 10 mg by mouth daily.   VITAMIN D  5000 units TABS Take 1 tablet by mouth daily.   escitalopram  20 MG tablet TAKE 1 TABLET BY DAILY   FLAXSEED OIL 1200 MG CAPS Take 1 capsule by mouth daily.   Levothyroxine 75 MCG tablet Take      1 tablet Daily      metoprolol succinate XL 25 MG  Take 0.5 tablets (12.5 mg total) daily.   PROBIOTIC-10 CAPS Take 1 capsule  daily.   rosuvastatin  20 MG tablet Take  1 tablet  3 x  /week  for Cholesterol    Allergies  Allergen Reactions   Ciprofloxacin Other (See Comments)    Alleged tendonitis   Z-Pak [Azithromycin] GI upset issues.    \ Past Medical History:  Diagnosis Date   Anxiety    Diverticular disease    GERD (gastroesophageal reflux disease)    Hx of diverticulitis of colon 04/04/2017   Hyperlipidemia, mixed    Hypothyroidism 2000   Vitamin D deficiency      Health Maintenance  Topic Date Due   Zoster Vaccines- Shingrix (1 of 2) Never done   Pneumonia Vaccine (2 - PPSV23 if available, else PCV20) 06/07/2019   MAMMOGRAM  03/18/2020  COVID-19 Vaccine (4 - Booster for Pfizer series) 08/23/2020   TETANUS/TDAP  12/30/2030   COLONOSCOPY  01/18/2031   INFLUENZA VACCINE  Completed   DEXA SCAN  Completed   Hepatitis C Screening  Completed   HPV VACCINES  Aged Out     Immunization History  Administered Date(s) Administered   Influenza Inj Mdck Quad  07/27/2017   Influenza, High Dose  07/16/2018   Influenza,inj,Quad PF,6+ Mos 07/26/2021   PFIZER SARS-COV-2 Vacc 10/04/2019, 10/30/2019, 06/28/2020   PPD Test 04/05/2017   Pneumococcal -13 06/06/2018   Td 12/29/2020   Tdap 08/28/2010    Last Colon - 2015   Last MGM -  in spring with Dr Kathryne Eriksson    Past Surgical History:  Procedure Laterality Date   BREAST BIOPSY Left    benign   CHOLECYSTECTOMY N/A 02/08/2017   Procedure: LAPAROSCOPIC CHOLECYSTECTOMY WITH INTRAOPERATIVE CHOLANGIOGRAM;  Surgeon: Donnie Mesa, MD;  Location: [facility];  Service: General;   Laterality: N/A;   EYE SURGERY     Eyelid lifted   TONSILLECTOMY       Family History  Problem Relation Age of Onset   Heart disease Mother    Heart disease Father    Breast cancer Sister        around age 57   Stomach cancer Neg Hx    Colon cancer Neg Hx    Esophageal cancer Neg Hx    Pancreatic cancer Neg Hx    Rectal cancer Neg Hx      Social History   Tobacco Use   Smoking status: Never   Smokeless tobacco: Never  Vaping Use   Vaping Use: Never used  Substance Use Topics   Alcohol use: Yes    Comment: occasional    Drug use: No      ROS Constitutional: Denies fever, chills, weight loss/gain, headaches, insomnia,  night sweats, and change in appetite. Does c/o fatigue. Eyes: Denies redness, blurred vision, diplopia, discharge, itchy, watery eyes.  ENT: Denies discharge, congestion, post nasal drip, epistaxis, sore throat, earache, hearing loss, dental pain, Tinnitus, Vertigo, Sinus pain, snoring.  Cardio: Denies chest pain, palpitations, irregular heartbeat, syncope, dyspnea, diaphoresis, orthopnea, PND, claudication, edema Respiratory: denies cough, dyspnea, DOE, pleurisy, hoarseness, laryngitis, wheezing.  Gastrointestinal: Denies dysphagia, heartburn, reflux, water brash, pain, cramps, nausea, vomiting, bloating, diarrhea, constipation, hematemesis, melena, hematochezia, jaundice, hemorrhoids Genitourinary: Denies dysuria, frequency, urgency, nocturia, hesitancy, discharge, hematuria, flank pain Breast: Breast lumps, nipple discharge, bleeding.  Musculoskeletal: Denies arthralgia, myalgia, stiffness, Jt. Swelling, pain, limp, and strain/sprain. Denies falls. Skin: Denies puritis, rash, hives, warts, acne, eczema, changing in skin lesion Neuro: No weakness, tremor, incoordination, spasms, paresthesia, pain Psychiatric: Denies confusion, memory loss, sensory loss. Denies Depression. Endocrine: Denies change in weight, skin, hair change, nocturia, and paresthesia,  diabetic polys, visual blurring, hyper / hypo glycemic episodes.  Heme/Lymph: No excessive bleeding, bruising, enlarged lymph nodes.  Physical Exam  BP 110/68    Pulse (!) 57    Temp 97.9 F (36.6 C)    Resp 16    Ht 5' 1.5" (1.562 m)    Wt 113 lb 9.6 oz (51.5 kg)    SpO2 98%    BMI 21.12 kg/m   General Appearance: Well nourished, well groomed and in no apparent distress.  Eyes: PERRLA, EOMs, conjunctiva no swelling or erythema, normal fundi and vessels. Sinuses: No frontal/maxillary tenderness ENT/Mouth: EACs patent / TMs  nl. Nares clear without erythema, swelling, mucoid exudates. Oral hygiene is good. No erythema,  swelling, or exudate. Tongue normal, non-obstructing. Tonsils not swollen or erythematous. Hearing normal.  Neck: Supple, thyroid not palpable. No bruits, nodes or JVD. Respiratory: Respiratory effort normal.  BS equal and clear bilateral without rales, rhonci, wheezing or stridor. Cardio: Heart sounds are normal with regular rate and rhythm and no murmurs, rubs or gallops. Peripheral pulses are normal and equal bilaterally without edema. No aortic or femoral bruits. Chest: symmetric with normal excursions and percussion. Breasts: Symmetric, without lumps, nipple discharge, retractions, or fibrocystic changes.  Abdomen: Flat, soft with bowel sounds active. Nontender, no guarding, rebound, hernias, masses, or organomegaly.  Lymphatics: Non tender without lymphadenopathy.  Genitourinary:  Musculoskeletal: Full ROM all peripheral extremities, joint stability, 5/5 strength, and normal gait. Skin: Warm and dry without rashes, lesions, cyanosis, clubbing or  ecchymosis.  Neuro: Cranial nerves intact, reflexes equal bilaterally. Normal muscle tone, no cerebellar symptoms. Sensation intact.  Pysch: Alert and oriented X 3, normal affect, Insight and Judgment appropriate.    Assessment and Plan   1. Labile hypertension  - EKG 12-Lead - Urinalysis, Routine w reflex microscopic -  Microalbumin / creatinine urine ratio - CBC with Differential/Platelet - COMPLETE METABOLIC PANEL WITH GFR - Magnesium - TSH  2. Hyperlipidemia, mixed  - EKG 12-Lead - Lipid panel - TSH  3. Abnormal glucose  - EKG 12-Lead - Hemoglobin A1c - Insulin, random  4. Vitamin D deficiency  - VITAMIN D 25 Hydroxy  5. Gastroesophageal reflux disease   6. Hypothyroidism  - TSH  7. Major depressive disorder, recurrent episode, in partial remission (HCC)  - TSH  8. Screening for colorectal cancer  - POC Hemoccult Bld/Stl   9. FHx: heart disease  - EKG 12-Lead  10. Screening for ischemic heart disease  - EKG 12-Lead  11. Medication management  - Urinalysis, Routine w reflex microscopic - Microalbumin / creatinine urine ratio - CBC with Differential/Platelet - COMPLETE METABOLIC PANEL WITH GFR - Magnesium - Lipid panel - TSH - Hemoglobin A1c - Insulin, random - VITAMIN D 25 Hydroxy           Patient was counseled in prudent diet to achieve/maintain BMI less than 25 for weight control, BP monitoring, regular exercise and medications. Discussed med's effects and SE's. Screening labs and tests as requested with regular follow-up as recommended. Over 40 minutes of exam, counseling, chart review and high complex critical decision making was performed.   Kirtland Bouchard, MD

## 2021-09-20 NOTE — Patient Instructions (Signed)

## 2021-09-21 ENCOUNTER — Other Ambulatory Visit: Payer: Self-pay

## 2021-09-21 ENCOUNTER — Encounter: Payer: Self-pay | Admitting: Internal Medicine

## 2021-09-21 ENCOUNTER — Ambulatory Visit (INDEPENDENT_AMBULATORY_CARE_PROVIDER_SITE_OTHER): Payer: Medicare Other | Admitting: Internal Medicine

## 2021-09-21 VITALS — BP 110/68 | HR 57 | Temp 97.9°F | Resp 16 | Ht 61.5 in | Wt 113.6 lb

## 2021-09-21 DIAGNOSIS — E559 Vitamin D deficiency, unspecified: Secondary | ICD-10-CM | POA: Diagnosis not present

## 2021-09-21 DIAGNOSIS — Z136 Encounter for screening for cardiovascular disorders: Secondary | ICD-10-CM | POA: Diagnosis not present

## 2021-09-21 DIAGNOSIS — K219 Gastro-esophageal reflux disease without esophagitis: Secondary | ICD-10-CM | POA: Diagnosis not present

## 2021-09-21 DIAGNOSIS — R0989 Other specified symptoms and signs involving the circulatory and respiratory systems: Secondary | ICD-10-CM | POA: Diagnosis not present

## 2021-09-21 DIAGNOSIS — R7309 Other abnormal glucose: Secondary | ICD-10-CM | POA: Diagnosis not present

## 2021-09-21 DIAGNOSIS — Z79899 Other long term (current) drug therapy: Secondary | ICD-10-CM

## 2021-09-21 DIAGNOSIS — Z1212 Encounter for screening for malignant neoplasm of rectum: Secondary | ICD-10-CM

## 2021-09-21 DIAGNOSIS — F3341 Major depressive disorder, recurrent, in partial remission: Secondary | ICD-10-CM

## 2021-09-21 DIAGNOSIS — Z8249 Family history of ischemic heart disease and other diseases of the circulatory system: Secondary | ICD-10-CM | POA: Diagnosis not present

## 2021-09-21 DIAGNOSIS — E039 Hypothyroidism, unspecified: Secondary | ICD-10-CM | POA: Diagnosis not present

## 2021-09-21 DIAGNOSIS — E782 Mixed hyperlipidemia: Secondary | ICD-10-CM | POA: Diagnosis not present

## 2021-09-21 MED ORDER — NEOMYCIN-POLYMYXIN-DEXAMETH 3.5-10000-0.1 OP SUSP
OPHTHALMIC | 1 refills | Status: DC
Start: 2021-09-21 — End: 2022-04-24

## 2021-09-22 ENCOUNTER — Telehealth: Payer: Self-pay

## 2021-09-22 LAB — CBC WITH DIFFERENTIAL/PLATELET
Absolute Monocytes: 510 cells/uL (ref 200–950)
Basophils Absolute: 58 cells/uL (ref 0–200)
Basophils Relative: 1 %
Eosinophils Absolute: 81 cells/uL (ref 15–500)
Eosinophils Relative: 1.4 %
HCT: 39.9 % (ref 35.0–45.0)
Hemoglobin: 13.2 g/dL (ref 11.7–15.5)
Lymphs Abs: 1717 cells/uL (ref 850–3900)
MCH: 31.6 pg (ref 27.0–33.0)
MCHC: 33.1 g/dL (ref 32.0–36.0)
MCV: 95.5 fL (ref 80.0–100.0)
MPV: 9.7 fL (ref 7.5–12.5)
Monocytes Relative: 8.8 %
Neutro Abs: 3434 cells/uL (ref 1500–7800)
Neutrophils Relative %: 59.2 %
Platelets: 264 10*3/uL (ref 140–400)
RBC: 4.18 10*6/uL (ref 3.80–5.10)
RDW: 11.6 % (ref 11.0–15.0)
Total Lymphocyte: 29.6 %
WBC: 5.8 10*3/uL (ref 3.8–10.8)

## 2021-09-22 LAB — LIPID PANEL
Cholesterol: 174 mg/dL (ref <200)
HDL: 64 mg/dL (ref >=50)
LDL Cholesterol (Calc): 94 mg/dL (calc)
Non-HDL Cholesterol (Calc): 110 mg/dL (calc) (ref <130)
Total CHOL/HDL Ratio: 2.7 (calc) (ref <5.0)
Triglycerides: 74 mg/dL (ref <150)

## 2021-09-22 LAB — COMPLETE METABOLIC PANEL WITH GFR
AG Ratio: 1.5 (calc) (ref 1.0–2.5)
ALT: 23 U/L (ref 6–29)
AST: 22 U/L (ref 10–35)
Albumin: 4.1 g/dL (ref 3.6–5.1)
Alkaline phosphatase (APISO): 46 U/L (ref 37–153)
BUN: 21 mg/dL (ref 7–25)
CO2: 36 mmol/L — ABNORMAL HIGH (ref 20–32)
Calcium: 9.8 mg/dL (ref 8.6–10.4)
Chloride: 100 mmol/L (ref 98–110)
Creat: 0.67 mg/dL (ref 0.50–1.05)
Globulin: 2.8 g/dL (calc) (ref 1.9–3.7)
Glucose, Bld: 70 mg/dL (ref 65–99)
Potassium: 5.1 mmol/L (ref 3.5–5.3)
Sodium: 139 mmol/L (ref 135–146)
Total Bilirubin: 0.4 mg/dL (ref 0.2–1.2)
Total Protein: 6.9 g/dL (ref 6.1–8.1)
eGFR: 95 mL/min/{1.73_m2} (ref >=60)

## 2021-09-22 LAB — URINALYSIS, ROUTINE W REFLEX MICROSCOPIC
Bilirubin Urine: NEGATIVE
Glucose, UA: NEGATIVE
Hgb urine dipstick: NEGATIVE
Ketones, ur: NEGATIVE
Leukocytes,Ua: NEGATIVE
Nitrite: NEGATIVE
Protein, ur: NEGATIVE
Specific Gravity, Urine: 1.015 (ref 1.001–1.035)
pH: 7 (ref 5.0–8.0)

## 2021-09-22 LAB — MICROALBUMIN / CREATININE URINE RATIO
Creatinine, Urine: 58 mg/dL (ref 20–275)
Microalb, Ur: <0.2 mg/dL

## 2021-09-22 LAB — HEMOGLOBIN A1C
Hgb A1c MFr Bld: 5.6 % of total Hgb (ref <5.7)
Mean Plasma Glucose: 114 mg/dL
eAG (mmol/L): 6.3 mmol/L

## 2021-09-22 LAB — INSULIN, RANDOM: Insulin: 4.2 u[IU]/mL

## 2021-09-22 LAB — VITAMIN D 25 HYDROXY (VIT D DEFICIENCY, FRACTURES): Vit D, 25-Hydroxy: 81 ng/mL (ref 30–100)

## 2021-09-22 LAB — TSH: TSH: 0.58 mIU/L (ref 0.40–4.50)

## 2021-09-22 LAB — MAGNESIUM: Magnesium: 2 mg/dL (ref 1.5–2.5)

## 2021-09-22 NOTE — Progress Notes (Signed)
============================================================ °-   Test results slightly outside the reference range are not unusual. If there is anything important, I will review this with you,  otherwise it is considered normal test values.  If you have further questions,  please do not hesitate to contact me at the office or via My Chart.  ============================================================ ============================================================  -  Total Chol = 174   &    LDL Chol = 94    - Both  Excellent   -  Very low risk for Heart Attack  / Stroke ============================================================ ============================================================  -  Thyroid - Normal  & OK  ============================================================ ============================================================  -  A1c - Normal - No Diabetes  - Great ! ============================================================ ============================================================  -  Vitamin D = 81 - Excellent - Please Keep dose Same  ============================================================ ============================================================  -  All Else - CBC - Kidneys - Electrolytes - Liver - Magnesium & Thyroid    - all  Normal / OK ============================================================ ============================================================  - Keep up the [place] Work  !  ============================================================ ============================================================

## 2021-09-22 NOTE — Telephone Encounter (Signed)
Patient called and reported that she had a mammogram 07/26/2021 at [facility].

## 2021-10-01 ENCOUNTER — Other Ambulatory Visit: Payer: Self-pay | Admitting: Internal Medicine

## 2021-10-03 ENCOUNTER — Other Ambulatory Visit: Payer: Self-pay

## 2021-10-03 DIAGNOSIS — Z1211 Encounter for screening for malignant neoplasm of colon: Secondary | ICD-10-CM

## 2021-10-03 DIAGNOSIS — Z1212 Encounter for screening for malignant neoplasm of rectum: Secondary | ICD-10-CM

## 2021-10-03 LAB — POC HEMOCCULT BLD/STL (HOME/3-CARD/SCREEN)
Card #2 Fecal Occult Blod, POC: NEGATIVE
Card #3 Fecal Occult Blood, POC: NEGATIVE
Fecal Occult Blood, POC: NEGATIVE

## 2021-10-04 DIAGNOSIS — Z1212 Encounter for screening for malignant neoplasm of rectum: Secondary | ICD-10-CM

## 2021-10-04 DIAGNOSIS — Z1211 Encounter for screening for malignant neoplasm of colon: Secondary | ICD-10-CM

## 2021-10-17 ENCOUNTER — Other Ambulatory Visit: Payer: Self-pay

## 2021-10-17 DIAGNOSIS — M810 Age-related osteoporosis without current pathological fracture: Secondary | ICD-10-CM

## 2021-10-17 DIAGNOSIS — E039 Hypothyroidism, unspecified: Secondary | ICD-10-CM

## 2021-10-17 MED ORDER — ALENDRONATE SODIUM 70 MG PO TABS: ORAL_TABLET | ORAL | 3 refills | Status: DC

## 2021-10-17 MED ORDER — LEVOTHYROXINE SODIUM 75 MCG PO TABS: ORAL_TABLET | ORAL | 3 refills | Status: DC

## 2021-11-07 ENCOUNTER — Other Ambulatory Visit: Payer: Self-pay | Admitting: Internal Medicine

## 2021-11-07 DIAGNOSIS — F3341 Major depressive disorder, recurrent, in partial remission: Secondary | ICD-10-CM

## 2021-11-07 MED ORDER — ESCITALOPRAM OXALATE 20 MG PO TABS: ORAL_TABLET | ORAL | 3 refills | Status: DC

## 2021-11-12 ENCOUNTER — Other Ambulatory Visit: Payer: Self-pay | Admitting: Adult Health Nurse Practitioner

## 2021-11-12 DIAGNOSIS — F3341 Major depressive disorder, recurrent, in partial remission: Secondary | ICD-10-CM

## 2021-11-14 ENCOUNTER — Encounter: Payer: Self-pay | Admitting: Internal Medicine

## 2021-12-19 ENCOUNTER — Other Ambulatory Visit: Payer: Self-pay | Admitting: Nurse Practitioner

## 2021-12-19 ENCOUNTER — Encounter: Payer: Self-pay | Admitting: Internal Medicine

## 2021-12-19 MED ORDER — METOPROLOL SUCCINATE ER 25 MG PO TB24: ORAL_TABLET | ORAL | 2 refills | Status: DC

## 2021-12-29 ENCOUNTER — Ambulatory Visit: Payer: Medicare Other | Admitting: Adult Health

## 2022-01-13 NOTE — Progress Notes (Signed)
MEDICARE ANNUAL WELLNESS VISIT AND 3 MONTH FOLLOW UP  Assessment:   Isabel Tyler was seen today for medicare wellness.  Diagnoses and all orders for this visit:  Annual Medicare Wellness Visit Due annually  Health maintenance reviewed  Palpitations On toprol low dose via cardiology Dr. Harrington Challenger Denies recent sx  Hypothyroidism, unspecified type continue medications the same pending lab results reminded to take on an empty stomach 30-31mns before food.  check TSH level  Diverticulosis of colon without hemorrhage Doing well on current regimen Continue probiotic  Gastroesophageal reflux disease, esophagitis presence not specified Well managed off of meds at this time Discussed diet, avoiding triggers and other lifestyle changes  Hyperlipidemia, mixed Continue on Crestor 44mRecently at goal  Check lipids  COPD ([place]; never smoker, asymptomatic, not on inhalers, monitor   Depression, recurrent in remission ([place] Doing well on lexapro hasn't tolerated taper Continue with benefit PQ2, negative  FHx: heart disease Control blood pressure, cholesterol, glucose, increase exercise.   Vitamin D deficiency Continue supplementation  Osteoporosis, unspecified  getting via GYN, reports requested   Continue Fosamax, doing well with this Teach back provided regarding proper medication administration Continue Calcium & Vit D supplements  Abnormal glucose Discussed disease progression and risks Discussed diet/exercise, weight management and risk modification  Hemangioma  Monitor, follow up with GI   Spinal stenosis  Recently improved; monitor   Need for pneumonia vaccination - 23 valent pneumococcal vaccine administered without complication today    Orders Placed This Encounter  Procedures   Pneumococcal polysaccharide vaccine 23-valent greater than or equal to 2yo subcutaneous/IM   CBC with Differential/Platelet   COMPLETE METABOLIC PANEL WITH GFR    Magnesium   Lipid panel   TSH   Vitamin B12    Over 40 minutes of exam, counseling, chart review and critical decision making was performed Future Appointments  Date Time Provider [place]8/29/2023  9:30 AM McUnk PintoMD [facility] None  09/26/2022 10:00 AM McUnk PintoMD [facility] None     Plan:   During the course of the visit the patient was educated and counseled about appropriate screening and preventive services including:   Pneumococcal vaccine  Prevnar 13 Influenza vaccine Td vaccine Screening electrocardiogram Bone densitometry screening Colorectal cancer screening Diabetes screening Glaucoma screening Nutrition counseling  Advanced directives: requested   Subjective:  Isabel Tyler a [age].o. female who presents for Medicare Annual Wellness Visit and 3 month follow up. She has Hyperlipidemia, mixed; Hypothyroidism; Major depressive disorder, recurrent episode, in partial remission ([place] Diverticulosis of colon without hemorrhage; Hepatic hemangioma, Rt lobe (7 cm) Jan 2017; Gastroesophageal reflux disease; Vitamin D deficiency; Abnormal glucose; Palpitations; Lumbar spinal stenosis; Trigger thumb, left thumb; Rosacea; Osteoporosis; and COPD (chronic obstructive pulmonary disease) ([place] their problem list.  Watches her grandkids 1/2 day 4 days a week.   She has a diagnosis of recurrent major depression/anxiety and is currently on lexapro 20 mg daily, reports symptoms are well controlled on current regimen. She has tried to taper off several times but hasn't done well.   Hemangioma, 7 cm of R lobe, noted 08/2015 on imaging , had been declining follow up. Is est with GI Dr. PyHilarie Fredrickson  She has been having epigastric sx; EKG in office showed possible a. Fib, cardiology Dr. RoHarrington Challengerelt more atrial bigeminy, had holter 09/2020 showing NSR with PACs, rare brief SVT, had normal ECHO other than mild MR. She was initiated on toprol 12.5 mg  daily and doing well.   Osteoporosis followed by GYN on fosamax.   BMI is Body mass index is 21.56 kg/m., she has been working on diet and exercise, doing silver sneakers 1 hour 3 days a week.  Wt Readings from Last 3 Encounters:  01/17/22 116 lb (52.6 kg)  09/21/21 113 lb 9.6 oz (51.5 kg)  07/12/21 113 lb (51.3 kg)    Her blood pressure has been controlled at home, today their BP is BP: 102/64 She does workout. She denies chest pain, shortness of breath, dizziness.   She is on cholesterol medication and denies myalgias. Her cholesterol is at goal. The cholesterol last visit was:   Lab Results  Component Value Date   CHOL 174 09/21/2021   HDL 64 09/21/2021   LDLCALC 94 09/21/2021   TRIG 74 09/21/2021   CHOLHDL 2.7 09/21/2021    Last A1C in the office was:  Lab Results  Component Value Date   HGBA1C 5.6 09/21/2021   Last GFR: Lab Results  Component Value Date   EGFR 95 09/21/2021   She is on thyroid medication. Her medication was not changed last visit.   Lab Results  Component Value Date   TSH 0.58 09/21/2021  . Patient is on Vitamin D supplement.   Lab Results  Component Value Date   VD25OH 81 09/21/2021      Medication Review: Current Outpatient Medications on File Prior to Visit  Medication Sig Dispense Refill   alendronate (FOSAMAX) 70 MG tablet TAKE 1 TABLET BY MOUTH EVERY WEEK IN THE MORNING ON AN EMPTY STOMACH WITH ONLY A GLASS OF WATER; NOTHING BY MOUTH FOR 1 HOUR 12 tablet 3   Ascorbic Acid (VITAMIN C) 1000 MG tablet Take 1,000 mg by mouth daily.     aspirin EC 81 MG tablet Take 81 mg by mouth daily.     Calcium Citrate (CITRACAL PO) Take 1 tablet by mouth daily.     cetirizine (ZYRTEC) 10 MG tablet Take 10 mg by mouth daily.     Cholecalciferol (VITAMIN D-3) 5000 units TABS Take 1 tablet by mouth daily.     escitalopram (LEXAPRO) 20 MG tablet Take  1 tablet  Daily  for Mood                                                       /                                 TAKE                    BY                   MOUTH 90 tablet 3   Flaxseed, Linseed, (FLAXSEED OIL) 1200 MG CAPS Take 1 capsule by mouth daily.     levothyroxine (SYNTHROID) 75 MCG tablet Take      1 tablet Daily        on an Empty Stomach      with only water for 30 minutes  & No Antacid meds, Calcium or Magnesium for 4 hours and AVOID Biotin 90 tablet 3   metoprolol succinate (TOPROL-XL) 25 MG 24 hr tablet TAKE 1/2 TABLET(12.5 MG) BY MOUTH DAILY 45  tablet 2   Multiple Vitamins-Minerals (PRESERVISION AREDS PO) Take by mouth 2 (two) times a week.     Probiotic Product (PROBIOTIC-10) CAPS Take 1 capsule by mouth daily.     rosuvastatin (CRESTOR) 20 MG tablet Take  1 tablet  3 x  /week  for Cholesterol 39 tablet 3   neomycin-polymyxin b-dexamethasone (MAXITROL) 3.5-10000-0.1 SUSP Instill 1 to 2 drops to the affected eye 4 x /day 5 mL 1   No current facility-administered medications on file prior to visit.    Allergies  Allergen Reactions   Ciprofloxacin Other (See Comments)    Alleged tendonitis   Z-Pak [Azithromycin] Other (See Comments)    Patient reports that the reaction was GI upset issues.    Current Problems (verified) Patient Active Problem List   Diagnosis Date Noted   COPD (chronic obstructive pulmonary disease) ([place]) 11/11/2019   Osteoporosis 10/07/2019   Rosacea 12/12/2018   Trigger thumb, left thumb 09/26/2018   Lumbar spinal stenosis 09/03/2018   Vitamin D deficiency 06/06/2018   Abnormal glucose 06/06/2018   Palpitations 06/06/2018   Gastroesophageal reflux disease 04/04/2017   Hepatic hemangioma, Rt lobe (7 cm) Jan 2017 12/12/2016   Hyperlipidemia, mixed 10/23/2016   Hypothyroidism 10/23/2016   Major depressive disorder, recurrent episode, in partial remission ([place]) 10/23/2016   Diverticulosis of colon without hemorrhage 10/23/2016    Screening Tests Immunization History  Administered Date(s) Administered   Influenza Inj Mdck Quad With Preservative  07/27/2017   Influenza, High Dose Seasonal PF 07/16/2018   Influenza,inj,Quad PF,6+ Mos 07/26/2021   PFIZER(Purple Top)SARS-COV-2 Vaccination 10/04/2019, 10/30/2019, 06/28/2020   PPD Test 04/05/2017   Pneumococcal Conjugate-13 06/06/2018   Pneumococcal Polysaccharide-23 01/17/2022   Td 12/29/2020   Tdap 08/28/2010   Health Maintenance  Topic Date Due   MAMMOGRAM  03/18/2020   COVID-19 Vaccine (4 - Booster for [place] series) 02/02/2022 (Originally 08/23/2020)   INFLUENZA VACCINE  03/28/2022   TETANUS/TDAP  12/30/2030   COLONOSCOPY (Pts 45-22yr Insurance coverage will need to be confirmed)  01/18/2031   Pneumonia Vaccine 71 Years old  Completed   DEXA SCAN  Completed   Hepatitis C Screening  Completed   HPV VACCINES  Aged Out   Zoster Vaccines- Shingrix  Discontinued   Last colonoscopy: 01/17/2021, Dr. PHilarie Fredrickson 10 year follow up per patient   Last mammogram: annually at GYN, Dr. AJulien Girt had within the last year per patient, will request reports  Last pap smear/pelvic exam: goes annually, GYN handles  DEXA: managed at GYN, reports has had since 2018, ? 2020, on fosamax - reports requested  Pneumococcal: DUE TODAY  Prevnar13: 05/2018 Shingles/Zostavax: Declined  Covid 19: 3/3, last 06/2020   Names of Other Physician/Practitioners you currently use: 1. [facility] here for primary care 2. Eye Exam, Dr. GDelman Cheadle2023 3.Dentist: Dr. AMa Hillock 2022, Q624month Patient Care Team: McUnk PintoMD as PCP - General (Internal Medicine) RoFay RecordsMD as PCP - Cardiology (Cardiology)  SURGICAL HISTORY She  has a past surgical history that includes Breast biopsy (Left); Tonsillectomy; Eye surgery; and Cholecystectomy (N/A, 02/08/2017). FAMILY HISTORY Her family history includes Breast cancer in her sister; Heart disease in her father and mother. SOCIAL HISTORY She  reports that she has never smoked. She has never used smokeless tobacco. She  reports current alcohol use. She reports that she does not use drugs.   MEDICARE WELLNESS OBJECTIVES: Physical activity: Current Exercise Habits: Home exercise routine, Type of exercise: walking, Time (Minutes):  60, Frequency (Times/Week): 3, Weekly Exercise (Minutes/Week): 180, Intensity: Mild, Exercise limited by: None identified Cardiac risk factors: Cardiac Risk Factors include: advanced age (>59men, >38 women);dyslipidemia Depression/mood screen:      01/17/2022   12:02 PM  Depression screen PHQ 2/9  Decreased Interest 0  Down, Depressed, Hopeless 0  PHQ - 2 Score 0    ADLs:     01/17/2022   11:59 AM 09/20/2021   11:11 PM  In your present state of health, do you have any difficulty performing the following activities:  Hearing? 1 0  Comment has upcoming hearing test   Vision? 0 0  Difficulty concentrating or making decisions? 0 0  Walking or climbing stairs? 0 0  Dressing or bathing? 0 0  Doing errands, shopping? 0 0     Cognitive Testing  Alert? Yes  Normal Appearance?Yes  Oriented to person? Yes  Place? Yes   Time? Yes  Recall of three objects?  Yes  Can perform simple calculations? Yes  Displays appropriate judgment?Yes  Can read the correct time from a watch face?Yes  EOL planning: Does Patient Have a Medical Advance Directive?: Yes Type of Advance Directive: Healthcare Power of Attorney, Living will Does patient want to make changes to medical advance directive?: No - Patient declined Copy of [place] in Chart?: No - copy requested  Review of Systems  Constitutional:  Negative for malaise/fatigue and weight loss.  HENT:  Negative for hearing loss and tinnitus.   Eyes:  Negative for blurred vision and double vision.  Respiratory:  Negative for cough, sputum production, shortness of breath and wheezing.   Cardiovascular:  Negative for chest pain, palpitations, orthopnea, claudication, leg swelling and PND.  Gastrointestinal:  Negative for  abdominal pain, blood in stool, constipation, diarrhea, heartburn, melena, nausea and vomiting.  Genitourinary: Negative.   Musculoskeletal:  Negative for falls, joint pain and myalgias.  Skin:  Negative for rash.  Neurological:  Negative for dizziness, tingling, sensory change, weakness and headaches.  Endo/Heme/Allergies:  Negative for polydipsia.  Psychiatric/Behavioral: Negative.  Negative for depression, memory loss, substance abuse and suicidal ideas. The patient is not nervous/anxious and does not have insomnia.   All other systems reviewed and are negative.   Objective:     Today's Vitals   01/17/22 1103  BP: 102/64  Pulse: 69  SpO2: 98%  Weight: 116 lb (52.6 kg)   Body mass index is 21.56 kg/m.  General appearance: alert, no distress, WD/WN, female HEENT: normocephalic, sclerae anicteric, TMs pearly, nares patent, no discharge or erythema, pharynx normal Oral cavity: MMM, no lesions Neck: supple, no lymphadenopathy, no thyromegaly, no masses Heart: RRR, normal S1, S2, no murmurs Lungs: CTA bilaterally, no wheezes, rhonchi, or rales Abdomen: +bs, soft, non tender, non distended, no masses, no hepatomegaly, no splenomegaly Musculoskeletal: nontender, no swelling, no obvious deformity Extremities: no edema, no cyanosis, no clubbing Pulses: 2+ symmetric, upper and lower extremities, normal cap refill Neurological: alert, oriented x 3, CN2-12 intact, strength normal upper extremities and lower extremities, sensation normal throughout, DTRs 2+ throughout, no cerebellar signs, gait normal Psychiatric: normal affect, behavior normal, pleasant   Medicare Attestation I have personally reviewed: The patient's medical and social history Their use of alcohol, tobacco or illicit drugs Their current medications and supplements The patient's functional ability including ADLs,fall risks, home safety risks, cognitive, and hearing and visual impairment Diet and physical  activities Evidence for depression or mood disorders  The patient's weight, height, BMI, and visual acuity  have been recorded in the chart.  I have made referrals, counseling, and provided education to the patient based on review of the above and I have provided the patient with a written personalized care plan for preventive services.     Izora Ribas, NP   01/17/2022

## 2022-01-17 ENCOUNTER — Ambulatory Visit (INDEPENDENT_AMBULATORY_CARE_PROVIDER_SITE_OTHER): Payer: Medicare Other | Admitting: Adult Health

## 2022-01-17 ENCOUNTER — Encounter: Payer: Self-pay | Admitting: Adult Health

## 2022-01-17 VITALS — BP 102/64 | HR 69 | Wt 116.0 lb

## 2022-01-17 DIAGNOSIS — F3342 Major depressive disorder, recurrent, in full remission: Secondary | ICD-10-CM | POA: Diagnosis not present

## 2022-01-17 DIAGNOSIS — E538 Deficiency of other specified B group vitamins: Secondary | ICD-10-CM

## 2022-01-17 DIAGNOSIS — K5730 Diverticulosis of large intestine without perforation or abscess without bleeding: Secondary | ICD-10-CM | POA: Diagnosis not present

## 2022-01-17 DIAGNOSIS — D1803 Hemangioma of intra-abdominal structures: Secondary | ICD-10-CM

## 2022-01-17 DIAGNOSIS — R6889 Other general symptoms and signs: Secondary | ICD-10-CM

## 2022-01-17 DIAGNOSIS — M48061 Spinal stenosis, lumbar region without neurogenic claudication: Secondary | ICD-10-CM | POA: Diagnosis not present

## 2022-01-17 DIAGNOSIS — K219 Gastro-esophageal reflux disease without esophagitis: Secondary | ICD-10-CM

## 2022-01-17 DIAGNOSIS — Z0000 Encounter for general adult medical examination without abnormal findings: Secondary | ICD-10-CM

## 2022-01-17 DIAGNOSIS — E039 Hypothyroidism, unspecified: Secondary | ICD-10-CM

## 2022-01-17 DIAGNOSIS — E782 Mixed hyperlipidemia: Secondary | ICD-10-CM

## 2022-01-17 DIAGNOSIS — Z0001 Encounter for general adult medical examination with abnormal findings: Secondary | ICD-10-CM | POA: Diagnosis not present

## 2022-01-17 DIAGNOSIS — L719 Rosacea, unspecified: Secondary | ICD-10-CM | POA: Diagnosis not present

## 2022-01-17 DIAGNOSIS — E559 Vitamin D deficiency, unspecified: Secondary | ICD-10-CM

## 2022-01-17 DIAGNOSIS — Z79899 Other long term (current) drug therapy: Secondary | ICD-10-CM | POA: Diagnosis not present

## 2022-01-17 DIAGNOSIS — M810 Age-related osteoporosis without current pathological fracture: Secondary | ICD-10-CM | POA: Diagnosis not present

## 2022-01-17 DIAGNOSIS — R5383 Other fatigue: Secondary | ICD-10-CM | POA: Diagnosis not present

## 2022-01-17 DIAGNOSIS — R002 Palpitations: Secondary | ICD-10-CM

## 2022-01-17 DIAGNOSIS — J449 Chronic obstructive pulmonary disease, unspecified: Secondary | ICD-10-CM

## 2022-01-17 DIAGNOSIS — R030 Elevated blood-pressure reading, without diagnosis of hypertension: Secondary | ICD-10-CM | POA: Diagnosis not present

## 2022-01-17 DIAGNOSIS — Z23 Encounter for immunization: Secondary | ICD-10-CM

## 2022-01-17 DIAGNOSIS — R7309 Other abnormal glucose: Secondary | ICD-10-CM

## 2022-01-17 DIAGNOSIS — F3341 Major depressive disorder, recurrent, in partial remission: Secondary | ICD-10-CM

## 2022-01-18 LAB — COMPLETE METABOLIC PANEL WITH GFR
AG Ratio: 1.5 (calc) (ref 1.0–2.5)
ALT: 18 U/L (ref 6–29)
AST: 22 U/L (ref 10–35)
Albumin: 4.3 g/dL (ref 3.6–5.1)
Alkaline phosphatase (APISO): 54 U/L (ref 37–153)
BUN: 17 mg/dL (ref 7–25)
CO2: 32 mmol/L (ref 20–32)
Calcium: 9.8 mg/dL (ref 8.6–10.4)
Chloride: 102 mmol/L (ref 98–110)
Creat: 0.67 mg/dL (ref 0.50–1.05)
Globulin: 2.9 g/dL (calc) (ref 1.9–3.7)
Glucose, Bld: 76 mg/dL (ref 65–99)
Potassium: 4.9 mmol/L (ref 3.5–5.3)
Sodium: 140 mmol/L (ref 135–146)
Total Bilirubin: 0.5 mg/dL (ref 0.2–1.2)
Total Protein: 7.2 g/dL (ref 6.1–8.1)
eGFR: 95 mL/min/{1.73_m2} (ref >=60)

## 2022-01-18 LAB — LIPID PANEL
Cholesterol: 189 mg/dL (ref <200)
HDL: 73 mg/dL (ref >=50)
LDL Cholesterol (Calc): 99 mg/dL (calc)
Non-HDL Cholesterol (Calc): 116 mg/dL (calc) (ref <130)
Total CHOL/HDL Ratio: 2.6 (calc) (ref <5.0)
Triglycerides: 82 mg/dL (ref <150)

## 2022-01-18 LAB — MAGNESIUM: Magnesium: 2.2 mg/dL (ref 1.5–2.5)

## 2022-01-18 LAB — CBC WITH DIFFERENTIAL/PLATELET
Absolute Monocytes: 485 cells/uL (ref 200–950)
Basophils Absolute: 50 cells/uL (ref 0–200)
Basophils Relative: 0.8 %
Eosinophils Absolute: 38 cells/uL (ref 15–500)
Eosinophils Relative: 0.6 %
HCT: 41 % (ref 35.0–45.0)
Hemoglobin: 13.8 g/dL (ref 11.7–15.5)
Lymphs Abs: 1903 cells/uL (ref 850–3900)
MCH: 31.9 pg (ref 27.0–33.0)
MCHC: 33.7 g/dL (ref 32.0–36.0)
MCV: 94.9 fL (ref 80.0–100.0)
MPV: 9.7 fL (ref 7.5–12.5)
Monocytes Relative: 7.7 %
Neutro Abs: 3824 cells/uL (ref 1500–7800)
Neutrophils Relative %: 60.7 %
Platelets: 275 10*3/uL (ref 140–400)
RBC: 4.32 10*6/uL (ref 3.80–5.10)
RDW: 11.6 % (ref 11.0–15.0)
Total Lymphocyte: 30.2 %
WBC: 6.3 10*3/uL (ref 3.8–10.8)

## 2022-01-18 LAB — VITAMIN B12: Vitamin B-12: 424 pg/mL (ref 200–1100)

## 2022-01-18 LAB — TSH: TSH: 0.94 mIU/L (ref 0.40–4.50)

## 2022-01-25 DIAGNOSIS — H6123 Impacted cerumen, bilateral: Secondary | ICD-10-CM | POA: Diagnosis not present

## 2022-01-26 ENCOUNTER — Encounter: Payer: Self-pay | Admitting: Internal Medicine

## 2022-02-21 DIAGNOSIS — H01002 Unspecified blepharitis right lower eyelid: Secondary | ICD-10-CM | POA: Diagnosis not present

## 2022-02-21 DIAGNOSIS — H00015 Hordeolum externum left lower eyelid: Secondary | ICD-10-CM | POA: Diagnosis not present

## 2022-03-22 ENCOUNTER — Encounter: Payer: Self-pay | Admitting: Internal Medicine

## 2022-04-24 ENCOUNTER — Other Ambulatory Visit: Payer: Self-pay | Admitting: Internal Medicine

## 2022-04-24 ENCOUNTER — Encounter: Payer: Self-pay | Admitting: Internal Medicine

## 2022-04-24 NOTE — Patient Instructions (Addendum)

## 2022-04-24 NOTE — Progress Notes (Unsigned)
Future Appointments  Date Time Provider Department  04/25/2022  9:30 AM Unk Pinto, MD [facility]  09/26/2022 10:00 AM Unk Pinto, MD [facility]  01/24/2023 11:00 AM Liane Comber, NP [facility]      History of Present Illness:       This very nice 71 y.o. MWF presents for 6  month follow up with HTN, HLD, Hypothyroidism, Pre-Diabetes and Vitamin D Deficiency.  Patient has hx/o Depression on remission controlled on Lexapro.       Patient is followed expectantly for labile HTN (2000) & BP has been controlled at home. Today's BP is at goal - 120/78.  Negative stress Myoview in 2015.  Patient is also followed by Dr Dorris Carnes for Atrial bigeminy . Patient has had no complaints of any cardiac type chest pain, palpitations, dyspnea / orthopnea / PND, dizziness, claudication, or dependent edema.       Hyperlipidemia is controlled with diet & Rosuvastatin.   Patient denies myalgias or other med SE's. Last Lipids were at goal:  Lab Results  Component Value Date   CHOL 189 01/17/2022   HDL 73 01/17/2022   LDLCALC 99 01/17/2022   TRIG 82 01/17/2022   CHOLHDL 2.6 01/17/2022     Also, the patient has been monitored for glucose intolerance and has had no symptoms of reactive hypoglycemia, diabetic polys, paresthesias or visual blurring.  Last A1c was normal & at goal:  Lab Results  Component Value Date   HGBA1C 5.4 09/10/2020                                                                                               Patient has been  on Thyroid Replacement since dx'd Hypothyroid in 2000.   Further, the patient also has history of Vitamin D Deficiency and supplements vitamin D without any suspected side-effects. Last vitamin D was at goal:  Lab Results  Component Value Date   VD25OH 81 09/21/2021        Current Outpatient Medications:    alendronate (FOSAMAX) 70 MG tablet, TAKE 1 TABLET BY MOUTH EVERY WEEK IN THE MORNING ON AN EMPTY STOMACH WITH ONLY A GLASS OF  WATER; NOTHING BY MOUTH FOR 1 HOUR, Disp: 12 tablet, Rfl: 3   Ascorbic Acid (VITAMIN C) 1000 MG tablet, Take 1,000 mg by mouth daily., Disp: , Rfl:    aspirin EC 81 MG tablet, Take 81 mg by mouth daily., Disp: , Rfl:    Calcium Citrate (CITRACAL PO), Take 1 tablet by mouth daily., Disp: , Rfl:    cetirizine (ZYRTEC) 10 MG tablet, Take 10 mg by mouth daily., Disp: , Rfl:    Cholecalciferol (VITAMIN D-3) 5000 units TABS, Take 1 tablet by mouth daily., Disp: , Rfl:    escitalopram (LEXAPRO) 20 MG tablet, Take  1 tablet  Daily  for Mood                                                       /  TAKE                    BY                   MOUTH, Disp: 90 tablet, Rfl: 3   Flaxseed, Linseed, (FLAXSEED OIL) 1200 MG CAPS, Take 1 capsule by mouth daily., Disp: , Rfl:    levothyroxine (SYNTHROID) 75 MCG tablet, Take      1 tablet Daily        on an Empty Stomach      with only water for 30 minutes  & No Antacid meds, Calcium or Magnesium for 4 hours and AVOID Biotin, Disp: 90 tablet, Rfl: 3   metoprolol succinate (TOPROL-XL) 25 MG 24 hr tablet, TAKE 1/2 TABLET(12.5 MG) BY MOUTH DAILY, Disp: 45 tablet, Rfl: 2   Multiple Vitamins-Minerals (PRESERVISION AREDS PO), Take by mouth 2 (two) times a week., Disp: , Rfl:    neomycin-polymyxin b-dexamethasone (MAXITROL) 3.5-10000-0.1 SUSP, Instill 1 to 2 drops to the affected eye 4 x /day, Disp: 5 mL, Rfl: 1   Probiotic Product (PROBIOTIC-10) CAPS, Take 1 capsule by mouth daily., Disp: , Rfl:    rosuvastatin (CRESTOR) 20 MG tablet, Take  1 tablet  3 x  /week  for Cholesterol, Disp: 39 tablet, Rfl: 3     Allergies  Allergen Reactions   Ciprofloxacin Other (See Comments)    Alleged tendonitis   Z-Pak [Azithromycin] Other (See Comments)    Patient reports that the reaction was GI upset issues.     PMHx:   Past Medical History:  Diagnosis Date   Anxiety    Diverticular disease    GERD (gastroesophageal reflux disease)    Hx of  diverticulitis of colon 04/04/2017   Hyperlipidemia, mixed    Hypothyroidism 2000   Vitamin D deficiency      Immunization History  Administered Date(s) Administered   Influenza Inj Mdck Quad With Preservative 07/27/2017   Influenza, High Dose Seasonal PF 07/16/2018   PFIZER-SARS-COV-2 Vaccination 10/04/2019, 10/30/2019, 06/28/2020   PPD Test 04/05/2017   Pneumococcal - 13 06/06/2018   Td 12/29/2020   Tdap 08/28/2010     Past Surgical History:  Procedure Laterality Date   BREAST BIOPSY Left    benign   CHOLECYSTECTOMY N/A 02/08/2017   Procedure: LAPAROSCOPIC CHOLECYSTECTOMY WITH INTRAOPERATIVE CHOLANGIOGRAM;  Surgeon: Donnie Mesa, MD;  Location: [facility];  Service: General;  Laterality: N/A;   EYE SURGERY     Eyelid lifted   TONSILLECTOMY      FHx:    Reviewed / unchanged  SHx:    Reviewed / unchanged   Systems Review:  Constitutional: Denies fever, chills, wt changes, headaches, insomnia, fatigue, night sweats, change in appetite. Eyes: Denies redness, blurred vision, diplopia, discharge, itchy, watery eyes.  ENT: Denies discharge, congestion, post nasal drip, epistaxis, sore throat, earache, hearing loss, dental pain, tinnitus, vertigo, sinus pain, snoring.  CV: Denies chest pain, palpitations, irregular heartbeat, syncope, dyspnea, diaphoresis, orthopnea, PND, claudication or edema. Respiratory: denies cough, dyspnea, DOE, pleurisy, hoarseness, laryngitis, wheezing.  Gastrointestinal: Denies dysphagia, odynophagia, heartburn, reflux, water brash, abdominal pain or cramps, nausea, vomiting, bloating, diarrhea, constipation, hematemesis, melena, hematochezia  or hemorrhoids. Genitourinary: Denies dysuria, frequency, urgency, nocturia, hesitancy, discharge, hematuria or flank pain. Musculoskeletal: Denies arthralgias, myalgias, stiffness, jt. swelling, pain, limping or strain/sprain.  Skin: Denies pruritus, rash, hives, warts, acne, eczema or change in skin  lesion(s). Neuro: No weakness, tremor, incoordination, spasms, paresthesia or pain. Psychiatric: Denies confusion,  memory loss or sensory loss. Endo: Denies change in weight, skin or hair change.  Heme/Lymph: No excessive bleeding, bruising or enlarged lymph nodes.  Physical Exam  There were no vitals taken for this visit.  Appears  well nourished, well groomed  and in no distress.  Eyes: PERRLA, EOMs, conjunctiva no swelling or erythema. Sinuses: No frontal/maxillary tenderness ENT/Mouth: EAC's clear, TM's nl w/o erythema, bulging. Nares clear w/o erythema, swelling, exudates. Oropharynx clear without erythema or exudates. Oral hygiene is good. Tongue normal, non obstructing. Hearing intact.  Neck: Supple. Thyroid not palpable. Car 2+/2+ without bruits, nodes or JVD. Chest: Respirations nl with BS clear & equal w/o rales, rhonchi, wheezing or stridor.  Cor: Heart sounds normal w/ regular rate and rhythm without sig. murmurs, gallops, clicks or rubs. Peripheral pulses normal and equal  without edema.  Abdomen: Soft & bowel sounds normal. Non-tender w/o guarding, rebound, hernias, masses or organomegaly.  Lymphatics: Unremarkable.  Musculoskeletal: Full ROM all peripheral extremities, joint stability, 5/5 strength and normal gait.  Skin: Warm, dry without exposed rashes, lesions or ecchymosis apparent.  Neuro: Cranial nerves intact, reflexes equal bilaterally. Sensory-motor testing grossly intact. Tendon reflexes grossly intact.  Pysch: Alert & oriented x 3.  Insight and judgement nl & appropriate. No ideations.  Assessment and Plan:   1. Labile hypertension  - CBC with Differential/Platelet - COMPLETE METABOLIC PANEL WITH GFR - Magnesium - TSH  2. Hyperlipidemia, mixed  - Lipid panel - TSH  3. Abnormal glucose  - Hemoglobin A1c - Insulin, random  4. Vitamin D deficiency  - VITAMIN D 25 Hydroxy   5. Hypothyroidism, unspecified type  - TSH  6. Gastroesophageal  reflux disease  - CBC with Differential/Platelet  7. Atrial arrhythmia  - TSH  8. Medication management  - CBC with Differential/Platelet - COMPLETE METABOLIC PANEL WITH GFR - Magnesium - Lipid panel - TSH - Hemoglobin A1c - Insulin, random - VITAMIN D 25 Hydroxy          Discussed  regular exercise, BP monitoring, weight control to achieve/maintain BMI less than 25 and discussed med and SE's. Recommended labs to assess and monitor clinical status with further disposition pending results of labs.  I discussed the assessment and treatment plan with the patient. The patient was provided an opportunity to ask questions and all were answered. The patient agreed with the plan and demonstrated an understanding of the instructions.  I provided over 30 minutes of exam, counseling, chart review and  complex critical decision making.        The patient was advised to call back or seek an in-person evaluation if the symptoms worsen or if the condition fails to improve as anticipated.   Kirtland Bouchard, MD

## 2022-04-25 ENCOUNTER — Ambulatory Visit (INDEPENDENT_AMBULATORY_CARE_PROVIDER_SITE_OTHER): Payer: Medicare Other | Admitting: Internal Medicine

## 2022-04-25 ENCOUNTER — Encounter: Payer: Self-pay | Admitting: Internal Medicine

## 2022-04-25 VITALS — BP 100/67 | HR 70 | Temp 97.8°F | Resp 16 | Ht 61.5 in | Wt 117.4 lb

## 2022-04-25 DIAGNOSIS — I498 Other specified cardiac arrhythmias: Secondary | ICD-10-CM | POA: Diagnosis not present

## 2022-04-25 DIAGNOSIS — K219 Gastro-esophageal reflux disease without esophagitis: Secondary | ICD-10-CM | POA: Diagnosis not present

## 2022-04-25 DIAGNOSIS — Z79899 Other long term (current) drug therapy: Secondary | ICD-10-CM

## 2022-04-25 DIAGNOSIS — E039 Hypothyroidism, unspecified: Secondary | ICD-10-CM

## 2022-04-25 DIAGNOSIS — E559 Vitamin D deficiency, unspecified: Secondary | ICD-10-CM

## 2022-04-25 DIAGNOSIS — R7309 Other abnormal glucose: Secondary | ICD-10-CM | POA: Diagnosis not present

## 2022-04-25 DIAGNOSIS — R0989 Other specified symptoms and signs involving the circulatory and respiratory systems: Secondary | ICD-10-CM

## 2022-04-25 DIAGNOSIS — E782 Mixed hyperlipidemia: Secondary | ICD-10-CM | POA: Diagnosis not present

## 2022-04-26 LAB — CBC WITH DIFFERENTIAL/PLATELET
Absolute Monocytes: 362 cells/uL (ref 200–950)
Basophils Absolute: 71 cells/uL (ref 0–200)
Basophils Relative: 1.4 %
Eosinophils Absolute: 41 cells/uL (ref 15–500)
Eosinophils Relative: 0.8 %
HCT: 41.1 % (ref 35.0–45.0)
Hemoglobin: 14 g/dL (ref 11.7–15.5)
Lymphs Abs: 1545 cells/uL (ref 850–3900)
MCH: 32.9 pg (ref 27.0–33.0)
MCHC: 34.1 g/dL (ref 32.0–36.0)
MCV: 96.5 fL (ref 80.0–100.0)
MPV: 9.6 fL (ref 7.5–12.5)
Monocytes Relative: 7.1 %
Neutro Abs: 3080 cells/uL (ref 1500–7800)
Neutrophils Relative %: 60.4 %
Platelets: 280 10*3/uL (ref 140–400)
RBC: 4.26 10*6/uL (ref 3.80–5.10)
RDW: 11.7 % (ref 11.0–15.0)
Total Lymphocyte: 30.3 %
WBC: 5.1 10*3/uL (ref 3.8–10.8)

## 2022-04-26 LAB — COMPLETE METABOLIC PANEL WITH GFR
AG Ratio: 1.8 (calc) (ref 1.0–2.5)
ALT: 22 U/L (ref 6–29)
AST: 19 U/L (ref 10–35)
Albumin: 4.5 g/dL (ref 3.6–5.1)
Alkaline phosphatase (APISO): 53 U/L (ref 37–153)
BUN: 18 mg/dL (ref 7–25)
CO2: 29 mmol/L (ref 20–32)
Calcium: 10.3 mg/dL (ref 8.6–10.4)
Chloride: 101 mmol/L (ref 98–110)
Creat: 0.72 mg/dL (ref 0.50–1.05)
Globulin: 2.5 g/dL (calc) (ref 1.9–3.7)
Glucose, Bld: 88 mg/dL (ref 65–99)
Potassium: 5.2 mmol/L (ref 3.5–5.3)
Sodium: 139 mmol/L (ref 135–146)
Total Bilirubin: 0.4 mg/dL (ref 0.2–1.2)
Total Protein: 7 g/dL (ref 6.1–8.1)
eGFR: 91 mL/min/{1.73_m2} (ref >=60)

## 2022-04-26 LAB — LIPID PANEL
Cholesterol: 187 mg/dL (ref <200)
HDL: 77 mg/dL (ref >=50)
LDL Cholesterol (Calc): 92 mg/dL (calc)
Non-HDL Cholesterol (Calc): 110 mg/dL (calc) (ref <130)
Total CHOL/HDL Ratio: 2.4 (calc) (ref <5.0)
Triglycerides: 89 mg/dL (ref <150)

## 2022-04-26 LAB — INSULIN, RANDOM: Insulin: 17.8 u[IU]/mL

## 2022-04-26 LAB — MAGNESIUM: Magnesium: 1.9 mg/dL (ref 1.5–2.5)

## 2022-04-26 LAB — VITAMIN D 25 HYDROXY (VIT D DEFICIENCY, FRACTURES): Vit D, 25-Hydroxy: 82 ng/mL (ref 30–100)

## 2022-04-26 LAB — HEMOGLOBIN A1C
Hgb A1c MFr Bld: 5.4 % of total Hgb (ref <5.7)
Mean Plasma Glucose: 108 mg/dL
eAG (mmol/L): 6 mmol/L

## 2022-04-26 LAB — TSH: TSH: 1.24 mIU/L (ref 0.40–4.50)

## 2022-04-26 NOTE — Progress Notes (Signed)
<><><><><><><><><><><><><><><><><><><><><><><><><><><><><><><><><> <><><><><><><><><><><><><><><><><><><><><><><><><><><><><><><><><> -   Test results slightly outside the reference range are not unusual. If there is anything important, I will review this with you,  otherwise it is considered normal test values.  If you have further questions,  please do not hesitate to contact me at the office or via My Chart.  <><><><><><><><><><><><><><><><><><><><><><><><><><><><><><><><><> <><><><><><><><><><><><><><><><><><><><><><><><><><><><><><><><><>  -  Total Cholesterol = 187 -  Excellent   - Very low risk for Heart Attack  / Stroke <><><><><><><><><><><><><><><><><><><><><><><><><><><><><><><><><>  -  Vitamin D = 82  - Excellent - Please keep dose same   <><><><><><><><><><><><><><><><><><><><><><><><><><><><><><><><><>  -  All Else - CBC - Kidneys - Electrolytes - Liver - Magnesium & Thyroid    - all  Normal / OK <><><><><><><><><><><><><><><><><><><><><><><><><><><><><><><><><>  -  Keep up the [place] Work   <><><><><><><><><><><><><><><><><><><><><><><><><><><><><><><><><>

## 2022-05-16 DIAGNOSIS — H353131 Nonexudative age-related macular degeneration, bilateral, early dry stage: Secondary | ICD-10-CM | POA: Diagnosis not present

## 2022-05-16 DIAGNOSIS — H2513 Age-related nuclear cataract, bilateral: Secondary | ICD-10-CM | POA: Diagnosis not present

## 2022-05-16 DIAGNOSIS — H5203 Hypermetropia, bilateral: Secondary | ICD-10-CM | POA: Diagnosis not present

## 2022-07-27 DIAGNOSIS — M816 Localized osteoporosis [Lequesne]: Secondary | ICD-10-CM | POA: Diagnosis not present

## 2022-07-27 DIAGNOSIS — Z13820 Encounter for screening for osteoporosis: Secondary | ICD-10-CM | POA: Diagnosis not present

## 2022-07-27 DIAGNOSIS — Z1231 Encounter for screening mammogram for malignant neoplasm of breast: Secondary | ICD-10-CM | POA: Diagnosis not present

## 2022-07-27 DIAGNOSIS — E039 Hypothyroidism, unspecified: Secondary | ICD-10-CM | POA: Diagnosis not present

## 2022-07-27 DIAGNOSIS — N958 Other specified menopausal and perimenopausal disorders: Secondary | ICD-10-CM | POA: Diagnosis not present

## 2022-07-27 DIAGNOSIS — R29890 Loss of height: Secondary | ICD-10-CM | POA: Diagnosis not present

## 2022-07-27 DIAGNOSIS — Z01419 Encounter for gynecological examination (general) (routine) without abnormal findings: Secondary | ICD-10-CM | POA: Diagnosis not present

## 2022-07-27 DIAGNOSIS — Z6822 Body mass index (BMI) 22.0-22.9, adult: Secondary | ICD-10-CM | POA: Diagnosis not present

## 2022-07-27 DIAGNOSIS — Z7983 Long term (current) use of bisphosphonates: Secondary | ICD-10-CM | POA: Diagnosis not present

## 2022-07-27 LAB — HM MAMMOGRAPHY

## 2022-07-27 LAB — HM DEXA SCAN

## 2022-08-01 ENCOUNTER — Ambulatory Visit: Payer: Medicare Other | Admitting: Nurse Practitioner

## 2022-08-12 NOTE — Progress Notes (Unsigned)
Cardiology Office Note   Date:  08/15/2022   ID:  Cortasia Screws, DOB 1951/05/30, MRN [id number]  PCP:  Unk Pinto, MD  Cardiologist:   Dorris Carnes, MD   Patient presents for follow-up of palpitations.   History of Present Illness: Isabel Tyler is a 71 y.o. female with a history of HTN, HL, hypothyroidism,  Myovue x 2  Last in 2015 Normal  Followed by [name].  Seen in clinic on 09/10/20  No CP  No dizziness  No palpitations    EKG was done that reportedly showed atrial fibrillation The pt was placed on Xarelto and referred to cardiology    I saw the pt for the first time in Jan 2022.  EKG showed sinus rhythm with PACs (bigeminy) and also sinus rhythm.  I did not think there was atrial fibrillation.  She went on to have a monitor that showed sinus rhythm a couple short burst of SVT that lasted about a minute each.  Again no atrial fibrillation.  Rare PVC/PAC.  I recommended a low-dose of a beta-blocker.  I saw the pt in Nov 2022  Since seen the pt denies palpitations    Breathing is OK  She denies CP  She says she just doesn't have energy   Wakes up fatigued    She denies snoring   Does not think she has sleep apnea  Mother had CVA  Father had angina  Died at 39     Current Meds  Medication Sig   alendronate (FOSAMAX) 70 MG tablet TAKE 1 TABLET BY MOUTH EVERY WEEK IN THE MORNING ON AN EMPTY STOMACH WITH ONLY A GLASS OF WATER; NOTHING BY MOUTH FOR 1 HOUR   Ascorbic Acid (VITAMIN C) 1000 MG tablet Take 1,000 mg by mouth daily.   aspirin EC 81 MG tablet Take 81 mg by mouth daily.   Calcium Citrate (CITRACAL PO) Take 1 tablet by mouth daily.   cetirizine (ZYRTEC) 10 MG tablet Take 10 mg by mouth daily.   Cholecalciferol (VITAMIN D-3) 5000 units TABS Take 1 tablet by mouth daily.   escitalopram (LEXAPRO) 20 MG tablet Take  1 tablet  Daily  for Mood                                                       /                                TAKE                    BY                    MOUTH   Flaxseed, Linseed, (FLAXSEED OIL) 1200 MG CAPS Take 1 capsule by mouth daily.   levothyroxine (SYNTHROID) 75 MCG tablet Take      1 tablet Daily        on an Empty Stomach      with only water for 30 minutes  & No Antacid meds, Calcium or Magnesium for 4 hours and AVOID Biotin   Melatonin 5 MG/ML LIQD Take 5 mg by mouth at bedtime.   metoprolol succinate (TOPROL-XL) 25 MG 24 hr tablet TAKE 1/2 TABLET(12.5 MG) BY MOUTH  DAILY   Multiple Vitamins-Minerals (PRESERVISION AREDS PO) Take by mouth 2 (two) times a week.   Probiotic Product (PROBIOTIC-10) CAPS Take 1 capsule by mouth daily.   rosuvastatin (CRESTOR) 20 MG tablet Take  1 tablet  3 x  /week  for Cholesterol     Allergies:   Ciprofloxacin and Z-pak [azithromycin]   Past Medical History:  Diagnosis Date   Anxiety    Diverticular disease    GERD (gastroesophageal reflux disease)    Hx of diverticulitis of colon 04/04/2017   Hyperlipidemia, mixed    Hypothyroidism 2000   Vitamin D deficiency     Past Surgical History:  Procedure Laterality Date   BREAST BIOPSY Left    benign   CHOLECYSTECTOMY N/A 02/08/2017   Procedure: LAPAROSCOPIC CHOLECYSTECTOMY WITH INTRAOPERATIVE CHOLANGIOGRAM;  Surgeon: Donnie Mesa, MD;  Location: [facility];  Service: General;  Laterality: N/A;   EYE SURGERY     Eyelid lifted   TONSILLECTOMY       Social History:  The patient  reports that she has never smoked. She has never used smokeless tobacco. She reports current alcohol use. She reports that she does not use drugs.   Family History:  The patient's family history includes Breast cancer in her sister; Heart disease in her father and mother.    ROS:  Please see the history of present illness. All other systems are reviewed and  Negative to the above problem except as noted.    PHYSICAL EXAM: VS:  BP 100/70 (BP Location: Left Arm, Patient Position: Sitting, Cuff Size: Normal)   Pulse 68   Ht 5' 1.5" (1.562 m)   Wt 117 lb (53.1  kg)   SpO2 98%   BMI 21.75 kg/m   GEN: Well nourished, well developed, in no acute distress  HEENT: normal  Neck: no JVD, no carotid bruits Cardiac:  RRR  No W3  ; no murmurs  No LE  edema  Respiratory:  clear to auscultation bilaterally,  GI: soft, nontender, nondistended   No hepatomegaly  MS: no deformity Moving all extremities   Skin: warm and dry, no rash Neuro:  Strength and sensation are intact Psych: euthymic mood, full affect   EKG:  EKG is not ordered today.   Lipid Panel    Component Value Date/Time   CHOL 187 04/25/2022 1049   TRIG 89 04/25/2022 1049   HDL 77 04/25/2022 1049   CHOLHDL 2.4 04/25/2022 1049   VLDL 13 04/05/2017 1046   LDLCALC 92 04/25/2022 1049      Wt Readings from Last 3 Encounters:  08/15/22 117 lb (53.1 kg)  04/25/22 117 lb 6.4 oz (53.3 kg)  01/17/22 116 lb (52.6 kg)      ASSESSMENT AND PLAN:  1  Fatigue   Cause   ? Sleep apnea  Patient denies   Could have central ? Rhythm   I do not think patient having rhythm problems to cuasae   ? CAD   Would set up for Ca score given FHx    ? Low BP    SHe denies dizziness    2  Rhythm  PACs, no afib noted    Keep on low dose toprol      3  HTN  BP is 100/   FOllow for now     4  HL   On Crestor  CT will guid  Rx      Current medicines are reviewed at length with the patient today.  The patient does not have concerns regarding medicines.  Signed, Dorris Carnes, MD  08/15/2022 11:47 AM    [address], [place], [place]  [address] Phone: [phone]; Fax: [phone]

## 2022-08-14 ENCOUNTER — Ambulatory Visit: Payer: Medicare Other | Admitting: Nurse Practitioner

## 2022-08-15 ENCOUNTER — Encounter: Payer: Self-pay | Admitting: Internal Medicine

## 2022-08-15 ENCOUNTER — Ambulatory Visit: Payer: Medicare Other | Attending: Internal Medicine | Admitting: Internal Medicine

## 2022-08-15 VITALS — BP 100/70 | HR 68 | Ht 61.5 in | Wt 117.0 lb

## 2022-08-15 DIAGNOSIS — R9431 Abnormal electrocardiogram [ECG] [EKG]: Secondary | ICD-10-CM | POA: Diagnosis not present

## 2022-08-15 NOTE — Patient Instructions (Signed)
Medication Instructions:   *If you need a refill on your cardiac medications before your next appointment, please call your pharmacy*   Lab Work:  If you have labs (blood work) drawn today and your tests are completely normal, you will receive your results only by: [place] (if you have MyChart) OR A paper copy in the mail If you have any lab test that is abnormal or we need to change your treatment, we will call you to review the results.   Testing/Procedures: Coronary Calcium Scan A coronary calcium scan is an imaging test used to look for deposits of plaque in the inner lining of the blood vessels of the heart (coronary arteries). Plaque is made up of calcium, protein, and fatty substances. These deposits of plaque can partly clog and narrow the coronary arteries without producing any symptoms or warning signs. This puts a person at risk for a heart attack. A coronary calcium scan is performed using a computed tomography (CT) scanner machine without using a dye (contrast). This test is recommended for people who are at moderate risk for heart disease. The test can find plaque deposits before symptoms develop. Tell a health care provider about: Any allergies you have. All medicines you are taking, including vitamins, herbs, eye drops, creams, and over-the-counter medicines. Any problems you or family members have had with anesthetic medicines. Any bleeding problems you have. Any surgeries you have had. Any medical conditions you have. Whether you are pregnant or may be pregnant. What are the risks? Generally, this is a safe procedure. However, problems may occur, including: Harm to a pregnant woman and her unborn baby. This test involves the use of radiation. Radiation exposure can be dangerous to a pregnant woman and her unborn baby. If you are pregnant or think you may be pregnant, you should not have this procedure done. A slight increase in the risk of cancer. This is  because of the radiation involved in the test. The amount of radiation from one test is similar to the amount of radiation you are naturally exposed to over one year. What happens before the procedure? Ask your health care provider for any specific instructions on how to prepare for this procedure. You may be asked to avoid products that contain caffeine, tobacco, or nicotine for 4 hours before the procedure. What happens during the procedure?  You will undress and remove any jewelry from your neck or chest. You may need to remove hearing aides and dentures. Women may need to remove their bras. You will put on a hospital gown. Sticky electrodes will be placed on your chest. The electrodes will be connected to an electrocardiogram (ECG) machine to record a tracing of the electrical activity of your heart. You will lie down on your back on a curved bed that is attached to the [place]. You may be given medicine to slow down your heart rate so that clear pictures can be created. You will be moved into the CT scanner, and the CT scanner will take pictures of your heart. During this time, you will be asked to lie still and hold your breath for 10-20 seconds at a time while each picture of your heart is being taken. The procedure may vary among health care providers and hospitals. What can I expect after the procedure? You can return to your normal activities. It is up to you to get the results of your procedure. Ask your health care provider, or the department that is doing the procedure,  when your results will be ready. Summary A coronary calcium scan is an imaging test used to look for deposits of plaque in the inner lining of the blood vessels of the heart. Plaque is made up of calcium, protein, and fatty substances. A coronary calcium scan is performed using a CT scanner machine without contrast. Generally, this is a safe procedure. Tell your health care provider if you are pregnant or may be  pregnant. Ask your health care provider for any specific instructions on how to prepare for this procedure. You can return to your normal activities after the scan is done. This information is not intended to replace advice given to you by your health care provider. Make sure you discuss any questions you have with your health care provider. Document Revised: 07/24/2021 Document Reviewed: 07/24/2021 Elsevier Patient Education  [place] SCORE CT    Follow-Up: At [facility], you and your health needs are our priority.  As part of our continuing mission to provide you with exceptional heart care, we have created designated Provider Care Teams.  These Care Teams include your primary Cardiologist (physician) and Advanced Practice Providers (APPs -  Physician Assistants and Nurse Practitioners) who all work together to provide you with the care you need, when you need it.  We recommend signing up for the patient portal called "MyChart".  Sign up information is provided on this After Visit Summary.  MyChart is used to connect with patients for Virtual Visits (Telemedicine).  Patients are able to view lab/test results, encounter notes, upcoming appointments, etc.  Non-urgent messages can be sent to your provider as well.   To learn more about what you can do with MyChart, go to [email or web address].    Your next appointment:   1 year(s)  The format for your next appointment:   In Person  Provider:   Dorris Carnes, MD     Other Instructions   Important Information About Sugar

## 2022-08-28 ENCOUNTER — Other Ambulatory Visit: Payer: Self-pay | Admitting: Internal Medicine

## 2022-08-28 ENCOUNTER — Encounter: Payer: Self-pay | Admitting: Internal Medicine

## 2022-08-28 DIAGNOSIS — H109 Unspecified conjunctivitis: Secondary | ICD-10-CM

## 2022-08-28 MED ORDER — NEOMYCIN-POLYMYXIN-DEXAMETH 3.5-10000-0.1 OP SUSP: OPHTHALMIC | 1 refills | Status: DC

## 2022-08-31 ENCOUNTER — Encounter: Payer: Self-pay | Admitting: Internal Medicine

## 2022-08-31 ENCOUNTER — Ambulatory Visit (INDEPENDENT_AMBULATORY_CARE_PROVIDER_SITE_OTHER): Payer: Medicare Other | Admitting: Nurse Practitioner

## 2022-08-31 VITALS — BP 110/68 | HR 65 | Temp 97.5°F | Ht 61.5 in | Wt 117.0 lb

## 2022-08-31 DIAGNOSIS — F3342 Major depressive disorder, recurrent, in full remission: Secondary | ICD-10-CM

## 2022-08-31 DIAGNOSIS — Z23 Encounter for immunization: Secondary | ICD-10-CM

## 2022-08-31 DIAGNOSIS — E782 Mixed hyperlipidemia: Secondary | ICD-10-CM | POA: Diagnosis not present

## 2022-08-31 DIAGNOSIS — J449 Chronic obstructive pulmonary disease, unspecified: Secondary | ICD-10-CM

## 2022-08-31 DIAGNOSIS — R002 Palpitations: Secondary | ICD-10-CM | POA: Diagnosis not present

## 2022-08-31 DIAGNOSIS — R7309 Other abnormal glucose: Secondary | ICD-10-CM | POA: Diagnosis not present

## 2022-08-31 DIAGNOSIS — Z79899 Other long term (current) drug therapy: Secondary | ICD-10-CM

## 2022-08-31 DIAGNOSIS — E039 Hypothyroidism, unspecified: Secondary | ICD-10-CM

## 2022-08-31 DIAGNOSIS — R0989 Other specified symptoms and signs involving the circulatory and respiratory systems: Secondary | ICD-10-CM | POA: Diagnosis not present

## 2022-08-31 DIAGNOSIS — K5730 Diverticulosis of large intestine without perforation or abscess without bleeding: Secondary | ICD-10-CM | POA: Diagnosis not present

## 2022-08-31 DIAGNOSIS — K219 Gastro-esophageal reflux disease without esophagitis: Secondary | ICD-10-CM | POA: Diagnosis not present

## 2022-08-31 NOTE — Progress Notes (Signed)
3 MONTH FOLLOW UP  Assessment:   Isabel Tyler was seen today for medicare wellness.  Diagnoses and all orders for this visit:  Labile Hypertension: Discussed DASH (Dietary Approaches to Stop Hypertension) DASH diet is lower in sodium than a typical American diet. Cut back on foods that are high in saturated fat, cholesterol, and trans fats. Eat more whole-grain foods, fish, poultry, and nuts Remain active and exercise as tolerated daily.  Monitor BP at home-Call if greater than 130/80.  Check CMP/CBC  Palpitations On toprol low dose via cardiology Dr. Harrington Challenger Denies recent sx  Hypothyroidism, unspecified type Continue Levothyroxine. Reminded to take on an empty stomach 30-41mns before food.  Stop any Biotin Supplement 48-72 hours before next TSH level to reduce the risk of falsely low TSH levels. Continue to monitor.     Diverticulosis of colon without hemorrhage Doing well on current regimen Continue probiotic  Gastroesophageal reflux disease, esophagitis presence not specified No suspected reflux complications ([place]/stricture). Lifestyle modification:  wt loss, avoid meals 2-3h before bedtime. Consider eliminating food triggers:  chocolate, caffeine, EtOH, acid/spicy food.  Hyperlipidemia, mixed Discussed lifestyle modifications. Recommended diet heavy in fruits and veggies, omega 3's. Decrease consumption of animal meats, cheeses, and dairy products. Remain active and exercise as tolerated. Continue to monitor. Check lipids/TSH  COPD ([place] Per CXR 2018; never smoker, asymptomatic, not on inhalers, monitor   Depression, recurrent in remission ([place] anxiety Controlled Continue Lexapro Continue to monitor  Abnormal glucose Education: Reviewed 'ABCs' of diabetes management  Discussed goals to be met and/or maintained include A1C (<7) Blood pressure (<130/80) Cholesterol (LDL <70) Continue Eye Exam yearly  Continue Dental Exam Q6 mo Discussed dietary  recommendations Check A1C  Need for flu vsccine  Administered Tolerated well   Orders Placed This Encounter  Procedures   Flu Vaccine QUAD 36+ mos IM (Fluarix, Fluzone & Afluria Quad PF   CBC with Differential/Platelet   COMPLETE METABOLIC PANEL WITH GFR   TSH   Lipid panel   Hemoglobin A1c    Notify office for further evaluation and treatment, questions or concerns if any reported s/s fail to improve.   The patient was advised to call back or seek an in-person evaluation if any symptoms worsen or if the condition fails to improve as anticipated.   Further disposition pending results of labs. Discussed med's effects and SE's.    I discussed the assessment and treatment plan with the patient. The patient was provided an opportunity to ask questions and all were answered. The patient agreed with the plan and demonstrated an understanding of the instructions.  Discussed med's effects and SE's. Screening labs and tests as requested with regular follow-up as recommended.  I provided 20 minutes of face-to-face time during this encounter including counseling, chart review, and critical decision making was preformed.  Future Appointments  Date Time Provider [place] 09/26/2022  3:00 PM [facility] 1 [facility]  11/28/2022  3:00 PM MUnk Pinto MD [facility] None  03/08/2023  9:30 AM WAlycia Rossetti NP [facility] None    Subjective:  Isabel Tyler Tyler a 72y.o. female who presents for a 3 month follow up. She has Hyperlipidemia, mixed; Hypothyroidism; Major depressive disorder, recurrent, in full remission ([place]; Diverticulosis of colon without hemorrhage; Hepatic hemangioma, Rt lobe (7 cm) Jan 2017; Gastroesophageal reflux disease; Vitamin D deficiency; Abnormal glucose; Palpitations; Lumbar spinal stenosis; Trigger thumb, left thumb; Rosacea; Osteoporosis; and COPD (chronic obstructive pulmonary disease) ([place] on their problem list.  Overall she reports  feeling well.   She has no new or additional concerns in clinic today.   She has a diagnosis of recurrent major depression/anxiety and is currently on lexapro 20 mg daily, reports symptoms are well controlled on current regimen. She has tried to taper off several times but hasn't done well.   She has been having epigastric sx; EKG in office showed possible a. Fib, cardiology Dr. Harrington Challenger felt more atrial bigeminy, had holter 09/2020 showing NSR with PACs, rare brief SVT, had normal ECHO other than mild MR. She was initiated on toprol 12.5 mg daily and doing well.    BMI is Body mass index is 21.75 kg/m., she has been working on diet and exercise, continues silver sneakers 1 hour 3 days a week.  Wt Readings from Last 3 Encounters:  08/31/22 117 lb (53.1 kg)  08/15/22 117 lb (53.1 kg)  04/25/22 117 lb 6.4 oz (53.3 kg)    Her blood pressure has been controlled at home, today their BP is BP: 110/68 She does workout. She denies chest pain, shortness of breath, dizziness.   She is on cholesterol medication and denies myalgias. Her cholesterol is at goal. The cholesterol last visit was:   Lab Results  Component Value Date   CHOL 187 04/25/2022   HDL 77 04/25/2022   LDLCALC 92 04/25/2022   TRIG 89 04/25/2022   CHOLHDL 2.4 04/25/2022    Last A1C in the office was:  Lab Results  Component Value Date   HGBA1C 5.4 04/25/2022   Last GFR: Lab Results  Component Value Date   EGFR 91 04/25/2022   She is on thyroid medication. Her medication was not changed last visit.   Lab Results  Component Value Date   TSH 1.24 04/25/2022  . Patient is on Vitamin D supplement.   Lab Results  Component Value Date   VD25OH 82 04/25/2022      Medication Review: Current Outpatient Medications on File Prior to Visit  Medication Sig Dispense Refill   Ascorbic Acid (VITAMIN C) 1000 MG tablet Take 1,000 mg by mouth daily.     aspirin EC 81 MG tablet Take 81 mg by mouth daily.     Calcium Citrate (CITRACAL PO) Take 1 tablet  by mouth daily.     Cholecalciferol (VITAMIN D-3) 5000 units TABS Take 1 tablet by mouth daily.     escitalopram (LEXAPRO) 20 MG tablet Take  1 tablet  Daily  for Mood                                                       /                                TAKE                    BY                   MOUTH 90 tablet 3   Flaxseed, Linseed, (FLAXSEED OIL) 1200 MG CAPS Take 1 capsule by mouth daily.     levothyroxine (SYNTHROID) 75 MCG tablet Take      1 tablet Daily        on an Empty Stomach      with  only water for 30 minutes  & No Antacid meds, Calcium or Magnesium for 4 hours and AVOID Biotin 90 tablet 3   Melatonin 5 MG/ML LIQD Take 5 mg by mouth at bedtime.     metoprolol succinate (TOPROL-XL) 25 MG 24 hr tablet TAKE 1/2 TABLET(12.5 MG) BY MOUTH DAILY 45 tablet 2   Multiple Vitamins-Minerals (PRESERVISION AREDS PO) Take by mouth 2 (two) times a week.     neomycin-polymyxin b-dexamethasone (MAXITROL) 3.5-10000-0.1 SUSP Instill 1 to 2 drops to affected eye(s) every 2 to 3 hours on day 1, then day 2 start 4 x /day 5 mL 1   Probiotic Product (PROBIOTIC-10) CAPS Take 1 capsule by mouth daily.     rosuvastatin (CRESTOR) 20 MG tablet Take  1 tablet  3 x  /week  for Cholesterol 39 tablet 3   alendronate (FOSAMAX) 70 MG tablet TAKE 1 TABLET BY MOUTH EVERY WEEK IN THE MORNING ON AN EMPTY STOMACH WITH ONLY A GLASS OF WATER; NOTHING BY MOUTH FOR 1 HOUR (Patient not taking: Reported on 08/31/2022) 12 tablet 3   cetirizine (ZYRTEC) 10 MG tablet Take 10 mg by mouth daily. (Patient not taking: Reported on 08/31/2022)     No current facility-administered medications on file prior to visit.    Allergies  Allergen Reactions   Ciprofloxacin Other (See Comments)    Alleged tendonitis   Z-Pak [Azithromycin] Other (See Comments)    Patient reports that the reaction was GI upset issues.    Current Problems (verified) Patient Active Problem List   Diagnosis Date Noted   COPD (chronic obstructive pulmonary disease)  ([place]) 11/11/2019   Osteoporosis 10/07/2019   Rosacea 12/12/2018   Trigger thumb, left thumb 09/26/2018   Lumbar spinal stenosis 09/03/2018   Vitamin D deficiency 06/06/2018   Abnormal glucose 06/06/2018   Palpitations 06/06/2018   Gastroesophageal reflux disease 04/04/2017   Hepatic hemangioma, Rt lobe (7 cm) Jan 2017 12/12/2016   Hyperlipidemia, mixed 10/23/2016   Hypothyroidism 10/23/2016   Major depressive disorder, recurrent, in full remission ([place]) 10/23/2016   Diverticulosis of colon without hemorrhage 10/23/2016    Screening Tests Immunization History  Administered Date(s) Administered   Influenza Inj Mdck Quad With Preservative 07/27/2017   Influenza, High Dose Seasonal PF 07/16/2018   Influenza,inj,Quad PF,6+ Mos 07/26/2021   PFIZER(Purple Top)SARS-COV-2 Vaccination 10/04/2019, 10/30/2019, 06/28/2020   PPD Test 04/05/2017   Pneumococcal Conjugate-13 06/06/2018   Pneumococcal Polysaccharide-23 01/17/2022   Td 12/29/2020   Tdap 08/28/2010   Health Maintenance  Topic Date Due   Medicare Annual Wellness (AWV)  Never done   INFLUENZA VACCINE  03/28/2022   COVID-19 Vaccine (4 - 2023-24 season) 04/28/2022   MAMMOGRAM  07/26/2022   DTaP/Tdap/Td (3 - Td or Tdap) 12/30/2030   COLONOSCOPY (Pts 45-77yr Insurance coverage will need to be confirmed)  01/18/2031   Pneumonia Vaccine 70 Years old  Completed   DEXA SCAN  Completed   Hepatitis C Screening  Completed   HPV VACCINES  Aged Out   Zoster Vaccines- Shingrix  Discontinued    Names of Other Physician/Practitioners you currently use: 1. [facility] here for primary care 2. Eye Exam, Dr. GDelman Cheadle2023 3.Dentist: Dr. AMa Hillock 2022, Q629month Patient Care Team: McUnk PintoMD as PCP - General (Internal Medicine) RoFay RecordsMD as PCP - Cardiology (Cardiology)  SURGICAL HISTORY She  has a past surgical history that includes Breast biopsy (Left); Tonsillectomy; Eye  surgery; and Cholecystectomy (N/A, 02/08/2017). FAMILY  HISTORY Her family history includes Breast cancer in her sister; Heart disease in her father and mother. SOCIAL HISTORY She  reports that she has never smoked. She has never used smokeless tobacco. She reports current alcohol use. She reports that she does not use drugs.  Review of Systems  Constitutional:  Negative for malaise/fatigue and weight loss.  HENT:  Negative for hearing loss and tinnitus.   Eyes:  Negative for blurred vision and double vision.  Respiratory:  Negative for cough, sputum production, shortness of breath and wheezing.   Cardiovascular:  Negative for chest pain, palpitations, orthopnea, claudication, leg swelling and PND.  Gastrointestinal:  Negative for abdominal pain, blood in stool, constipation, diarrhea, heartburn, melena, nausea and vomiting.  Genitourinary: Negative.   Musculoskeletal:  Negative for falls, joint pain and myalgias.  Skin:  Negative for rash.  Neurological:  Negative for dizziness, tingling, sensory change, weakness and headaches.  Endo/Heme/Allergies:  Negative for polydipsia.  Psychiatric/Behavioral: Negative.  Negative for depression, memory loss, substance abuse and suicidal ideas. The patient is not nervous/anxious and does not have insomnia.   All other systems reviewed and are negative.    Objective:     Today's Vitals   08/31/22 1124  BP: 110/68  Pulse: 65  Temp: (!) 97.5 F (36.4 C)  SpO2: 92%  Weight: 117 lb (53.1 kg)  Height: 5' 1.5" (1.562 m)   Body mass index is 21.75 kg/m.  General appearance: alert, no distress, WD/WN, female HEENT: normocephalic, sclerae anicteric, TMs pearly, nares patent, no discharge or erythema, pharynx normal Oral cavity: MMM, no lesions Neck: supple, no lymphadenopathy, no thyromegaly, no masses Heart: RRR, normal S1, S2, no murmurs Lungs: CTA bilaterally, no wheezes, rhonchi, or rales Abdomen: +bs, soft, non tender, non distended, no  masses, no hepatomegaly, no splenomegaly Musculoskeletal: nontender, no swelling, no obvious deformity Extremities: no edema, no cyanosis, no clubbing Pulses: 2+ symmetric, upper and lower extremities, normal cap refill Neurological: alert, oriented x 3, CN2-12 intact, strength normal upper extremities and lower extremities, sensation normal throughout, DTRs 2+ throughout, no cerebellar signs, gait normal Psychiatric: normal affect, behavior normal, pleasant    Isabel Tyler Pelcher, NP   08/31/2022

## 2022-08-31 NOTE — Patient Instructions (Signed)

## 2022-09-01 LAB — LIPID PANEL
Cholesterol: 168 mg/dL (ref <200)
HDL: 73 mg/dL (ref >=50)
LDL Cholesterol (Calc): 79 mg/dL (calc)
Non-HDL Cholesterol (Calc): 95 mg/dL (calc) (ref <130)
Total CHOL/HDL Ratio: 2.3 (calc) (ref <5.0)
Triglycerides: 80 mg/dL (ref <150)

## 2022-09-01 LAB — CBC WITH DIFFERENTIAL/PLATELET
Absolute Monocytes: 490 cells/uL (ref 200–950)
Basophils Absolute: 68 cells/uL (ref 0–200)
Basophils Relative: 1.1 %
Eosinophils Absolute: 81 cells/uL (ref 15–500)
Eosinophils Relative: 1.3 %
HCT: 39 % (ref 35.0–45.0)
Hemoglobin: 13.3 g/dL (ref 11.7–15.5)
Lymphs Abs: 1897 cells/uL (ref 850–3900)
MCH: 32.4 pg (ref 27.0–33.0)
MCHC: 34.1 g/dL (ref 32.0–36.0)
MCV: 94.9 fL (ref 80.0–100.0)
MPV: 10 fL (ref 7.5–12.5)
Monocytes Relative: 7.9 %
Neutro Abs: 3664 cells/uL (ref 1500–7800)
Neutrophils Relative %: 59.1 %
Platelets: 279 10*3/uL (ref 140–400)
RBC: 4.11 10*6/uL (ref 3.80–5.10)
RDW: 11.5 % (ref 11.0–15.0)
Total Lymphocyte: 30.6 %
WBC: 6.2 10*3/uL (ref 3.8–10.8)

## 2022-09-01 LAB — COMPLETE METABOLIC PANEL WITH GFR
AG Ratio: 1.6 (calc) (ref 1.0–2.5)
ALT: 21 U/L (ref 6–29)
AST: 21 U/L (ref 10–35)
Albumin: 4.2 g/dL (ref 3.6–5.1)
Alkaline phosphatase (APISO): 56 U/L (ref 37–153)
BUN: 21 mg/dL (ref 7–25)
CO2: 31 mmol/L (ref 20–32)
Calcium: 10 mg/dL (ref 8.6–10.4)
Chloride: 100 mmol/L (ref 98–110)
Creat: 0.66 mg/dL (ref 0.50–1.05)
Globulin: 2.7 g/dL (calc) (ref 1.9–3.7)
Glucose, Bld: 77 mg/dL (ref 65–139)
Potassium: 4.9 mmol/L (ref 3.5–5.3)
Sodium: 138 mmol/L (ref 135–146)
Total Bilirubin: 0.5 mg/dL (ref 0.2–1.2)
Total Protein: 6.9 g/dL (ref 6.1–8.1)
eGFR: 95 mL/min/{1.73_m2} (ref >=60)

## 2022-09-01 LAB — HEMOGLOBIN A1C
Hgb A1c MFr Bld: 5.7 % of total Hgb — ABNORMAL HIGH (ref <5.7)
Mean Plasma Glucose: 117 mg/dL
eAG (mmol/L): 6.5 mmol/L

## 2022-09-01 LAB — TSH: TSH: 2.54 mIU/L (ref 0.40–4.50)

## 2022-09-04 ENCOUNTER — Other Ambulatory Visit: Payer: Self-pay | Admitting: Internal Medicine

## 2022-09-04 DIAGNOSIS — H109 Unspecified conjunctivitis: Secondary | ICD-10-CM

## 2022-09-12 ENCOUNTER — Encounter: Payer: Self-pay | Admitting: Internal Medicine

## 2022-09-19 ENCOUNTER — Encounter: Payer: Self-pay | Admitting: Internal Medicine

## 2022-09-21 ENCOUNTER — Other Ambulatory Visit: Payer: Self-pay | Admitting: Nurse Practitioner

## 2022-09-26 ENCOUNTER — Encounter: Payer: Medicare Other | Admitting: Internal Medicine

## 2022-09-26 ENCOUNTER — Ambulatory Visit (HOSPITAL_BASED_OUTPATIENT_CLINIC_OR_DEPARTMENT_OTHER)
Admission: RE | Admit: 2022-09-26 | Discharge: 2022-09-26 | Disposition: A | Discharge status: Home / Self Care | Payer: Medicare Other | Source: Ambulatory Visit | Attending: Internal Medicine | Admitting: Internal Medicine

## 2022-09-26 DIAGNOSIS — R9431 Abnormal electrocardiogram [ECG] [EKG]: Secondary | ICD-10-CM | POA: Insufficient documentation

## 2022-10-05 ENCOUNTER — Other Ambulatory Visit: Payer: Self-pay | Admitting: Internal Medicine

## 2022-10-05 DIAGNOSIS — E039 Hypothyroidism, unspecified: Secondary | ICD-10-CM

## 2022-10-09 DIAGNOSIS — H01001 Unspecified blepharitis right upper eyelid: Secondary | ICD-10-CM | POA: Diagnosis not present

## 2022-10-09 DIAGNOSIS — H01004 Unspecified blepharitis left upper eyelid: Secondary | ICD-10-CM | POA: Diagnosis not present

## 2022-10-09 DIAGNOSIS — H01005 Unspecified blepharitis left lower eyelid: Secondary | ICD-10-CM | POA: Diagnosis not present

## 2022-10-09 DIAGNOSIS — H01002 Unspecified blepharitis right lower eyelid: Secondary | ICD-10-CM | POA: Diagnosis not present

## 2022-10-10 ENCOUNTER — Encounter: Payer: Self-pay | Admitting: Nurse Practitioner

## 2022-10-10 ENCOUNTER — Ambulatory Visit (INDEPENDENT_AMBULATORY_CARE_PROVIDER_SITE_OTHER): Payer: Medicare Other | Admitting: Nurse Practitioner

## 2022-10-10 VITALS — BP 112/72 | HR 76 | Temp 97.7°F | Ht 61.5 in | Wt 118.8 lb

## 2022-10-10 DIAGNOSIS — F419 Anxiety disorder, unspecified: Secondary | ICD-10-CM | POA: Diagnosis not present

## 2022-10-10 DIAGNOSIS — H6692 Otitis media, unspecified, left ear: Secondary | ICD-10-CM

## 2022-10-10 DIAGNOSIS — E039 Hypothyroidism, unspecified: Secondary | ICD-10-CM | POA: Diagnosis not present

## 2022-10-10 MED ORDER — AMOXICILLIN 500 MG PO TABS: 500.0000 mg | ORAL_TABLET | Freq: Three times a day (TID) | ORAL | 0 refills | Status: AC

## 2022-10-10 MED ORDER — BUSPIRONE HCL 5 MG PO TABS: 5.0000 mg | ORAL_TABLET | Freq: Three times a day (TID) | ORAL | 4 refills | Status: DC

## 2022-10-10 NOTE — Patient Instructions (Signed)
Use the Buspar every 6 hours as needed for anxiety  Use Mucinex twice a day for Congestion  Use amoxicillin 500 mg three times a day for ear infecton   Buspirone Tablets What is this medication? BUSPIRONE (byoo SPYE rone) treats anxiety. It works by balancing the levels of dopamine and serotonin in your brain, substances that help regulate mood. This medicine may be used for other purposes; ask your health care provider or pharmacist if you have questions. COMMON BRAND NAME(S): BuSpar, Buspar Dividose What should I tell my care team before I take this medication? They need to know if you have any of these conditions: Kidney or liver disease An unusual or allergic reaction to buspirone, other medications, foods, dyes, or preservatives Pregnant or trying to get pregnant Breast-feeding How should I use this medication? Take this medication by mouth with a glass of water. Follow the directions on the prescription label. You may take this medication with or without food. To ensure that this medication always works the same way for you, you should take it either always with or always without food. Take your doses at regular intervals. Do not take your medication more often than directed. Do not stop taking except on the advice of your care team. Talk to your care team about the use of this medication in children. Special care may be needed. Overdosage: If you think you have taken too much of this medicine contact a poison control center or emergency room at once. NOTE: This medicine is only for you. Do not share this medicine with others. What if I miss a dose? If you miss a dose, take it as soon as you can. If it is almost time for your next dose, take only that dose. Do not take double or extra doses. What may interact with this medication? Do not take this medication with any of the following: Linezolid MAOIs like Carbex, Eldepryl, Marplan, Nardil, and Parnate Methylene  blue Procarbazine This medication may also interact with the following: Diazepam Digoxin Diltiazem Erythromycin Grapefruit juice Haloperidol Medications for mental depression or mood problems Medications for seizures like carbamazepine, phenobarbital and phenytoin Nefazodone Other medications for anxiety Rifampin Ritonavir Some antifungal medications like itraconazole, ketoconazole, and voriconazole Verapamil Warfarin This list may not describe all possible interactions. Give your health care provider a list of all the medicines, herbs, non-prescription drugs, or dietary supplements you use. Also tell them if you smoke, drink alcohol, or use illegal drugs. Some items may interact with your medicine. What should I watch for while using this medication? Visit your care team for regular checks on your progress. It may take 1 to 2 weeks before your anxiety gets better. This medication may affect your coordination, reaction time, or judgment. Do not drive or operate machinery until you know how this medication affects you. Sit up or stand slowly to reduce the risk of dizzy or fainting spells. Drinking alcohol with this medication can increase the risk of these side effects. What side effects may I notice from receiving this medication? Side effects that you should report to your care team as soon as possible: Allergic reactions--skin rash, itching, hives, swelling of the face, lips, tongue, or throat Irritability, confusion, fast or irregular heartbeat, muscle stiffness, twitching muscles, sweating, high fever, seizure, chills, vomiting, diarrhea, which may be signs of serotonin syndrome Side effects that usually do not require medical attention (report to your care team if they continue or are bothersome): Anxiety, nervousness Dizziness Drowsiness Headache Nausea Trouble  sleeping This list may not describe all possible side effects. Call your doctor for medical advice about side effects.  You may report side effects to FDA at 1-800-FDA-1088. Where should I keep my medication? Keep out of the reach of children. Store at room temperature below 30 degrees C (86 degrees F). Protect from light. Keep container tightly closed. Throw away any unused medication after the expiration date. NOTE: This sheet is a summary. It may not cover all possible information. If you have questions about this medicine, talk to your doctor, pharmacist, or health care provider.  2023 Elsevier/Gold Standard (2007-10-05 00:00:00)

## 2022-10-10 NOTE — Progress Notes (Signed)
Assessment and Plan:  Edra was seen today for otalgia.  Diagnoses and all orders for this visit:  Hypothyroidism, unspecified type Will begin taking Levothyroxine 75 mcg daily at bedtime as this works better for being able to separate from other medications and food  Otitis of left ear Use Mucinex BID x 5-7 days Push fluids -     amoxicillin (AMOXIL) 500 MG tablet; Take 1 tablet (500 mg total) by mouth 3 (three) times daily for 7 days. 10 days  Anxiety Begin Buspar 5 mg q 6 hours as needed Continue Lexapro -     busPIRone (BUSPAR) 5 MG tablet; Take 1 tablet (5 mg total) by mouth 3 (three) times daily.       Further disposition pending results of labs. Discussed med's effects and SE's.   Over 30 minutes of exam, counseling, chart review, and critical decision making was performed.   Future Appointments  Date Time Provider [place]  11/30/2022 10:00 AM Unk Pinto, MD [facility] None  03/08/2023  9:30 AM Alycia Rossetti, NP [facility] None    ------------------------------------------------------------------------------------------------------------------   HPI BP 112/72   Pulse 76   Temp 97.7 F (36.5 C)   Ht 5' 1.5" (1.562 m)   Wt 118 lb 12.8 oz (53.9 kg)   SpO2 97%   BMI 22.08 kg/m   72 y.o.female presents for Sharp pain in her left ear x several weeks. Feeling fatigued, body aches, scratchy throat, congestion and sinus drainage. Had conjunctivitis in January and has been started on eye antibiotic(torbadex)  Grandson and granddaughter have strep  She is experiencing panic attack/anxiety several times a day- She is currently on Lexapro 20 mg daily.    BMI is Body mass index is 22.08 kg/m., she has been working on diet and exercise. Wt Readings from Last 3 Encounters:  10/10/22 118 lb 12.8 oz (53.9 kg)  08/31/22 117 lb (53.1 kg)  08/15/22 117 lb (53.1 kg)   BP well controlled without medication.  Denies chest pain, shortness of breath and  dizziness.  BP Readings from Last 3 Encounters:  10/10/22 112/72  08/31/22 110/68  08/15/22 100/70    Thyroid is currently well controlled on Levothyroxine 75 mcg daily.Difficulty separating coffee from her synthroid. Lab Results  Component Value Date   TSH 2.54 08/31/2022      Past Medical History:  Diagnosis Date   Anxiety    Diverticular disease    GERD (gastroesophageal reflux disease)    Hx of diverticulitis of colon 04/04/2017   Hyperlipidemia, mixed    Hypothyroidism 2000   Vitamin D deficiency      Allergies  Allergen Reactions   Ciprofloxacin Other (See Comments)    Alleged tendonitis   Z-Pak [Azithromycin] Other (See Comments)    Patient reports that the reaction was GI upset issues.    Current Outpatient Medications on File Prior to Visit  Medication Sig   Ascorbic Acid (VITAMIN C) 1000 MG tablet Take 1,000 mg by mouth daily.   aspirin EC 81 MG tablet Take 81 mg by mouth daily.   Calcium Citrate (CITRACAL PO) Take 1 tablet by mouth daily.   Cholecalciferol (VITAMIN D-3) 5000 units TABS Take 1 tablet by mouth daily.   escitalopram (LEXAPRO) 20 MG tablet Take  1 tablet  Daily  for Mood                                                       /  TAKE                    BY                   MOUTH   Flaxseed, Linseed, (FLAXSEED OIL) 1200 MG CAPS Take 1 capsule by mouth daily.   levothyroxine (SYNTHROID) 75 MCG tablet Take 1 tablet BY MOUTH Daily on an Empty Stomach with only water for 30 minutes & No Antacid meds, Calcium or Magnesium for 4 hours and AVOID Biotin   Melatonin 5 MG/ML LIQD Take 5 mg by mouth at bedtime.   metoprolol succinate (TOPROL-XL) 25 MG 24 hr tablet TAKE 1/2 TABLET(12.5 MG) BY MOUTH DAILY   Multiple Vitamins-Minerals (PRESERVISION AREDS PO) Take by mouth 2 (two) times a week.   Probiotic Product (PROBIOTIC-10) CAPS Take 1 capsule by mouth daily.   rosuvastatin (CRESTOR) 20 MG tablet Take  1 tablet  3 x  /week  for  Cholesterol   tobramycin-dexamethasone (TOBRADEX) ophthalmic solution 1 drop 2 (two) times daily.   No current facility-administered medications on file prior to visit.    ROS: all negative except above.   Physical Exam:  BP 112/72   Pulse 76   Temp 97.7 F (36.5 C)   Ht 5' 1.5" (1.562 m)   Wt 118 lb 12.8 oz (53.9 kg)   SpO2 97%   BMI 22.08 kg/m   General Appearance: Well nourished, in no apparent distress. Eyes: PERRLA, EOMs, conjunctiva no swelling or erythema Sinuses: No Frontal/maxillary tenderness ENT/Mouth: Ext aud canals clear, R TM without erythema, bulging.L TM dull retracted, fluid noted. No erythema, swelling, on post pharynx. Clear post nasal drip noticed.  Hearing normal.  Neck: Supple, thyroid normal.  Respiratory: Respiratory effort normal, BS equal bilaterally without rales, rhonchi, wheezing or stridor.  Cardio: RRR with no MRGs. Brisk peripheral pulses without edema.  Abdomen: Soft, + BS.  Non tender, no guarding, rebound, hernias, masses. Lymphatics: Non tender without lymphadenopathy.  Musculoskeletal: Full ROM, 5/5 strength, normal gait.  Skin: Warm, dry without rashes, lesions, ecchymosis.  Neuro: Cranial nerves intact. Normal muscle tone, no cerebellar symptoms. Sensation intact.  Psych: Awake and oriented X 3, normal affect, Insight and Judgment appropriate.     Alycia Rossetti, NP 11:48 AM [facility]

## 2022-10-25 ENCOUNTER — Ambulatory Visit (INDEPENDENT_AMBULATORY_CARE_PROVIDER_SITE_OTHER): Payer: Medicare Other | Admitting: Nurse Practitioner

## 2022-10-25 ENCOUNTER — Encounter: Payer: Self-pay | Admitting: Nurse Practitioner

## 2022-10-25 VITALS — BP 118/70

## 2022-10-25 DIAGNOSIS — U071 COVID-19: Secondary | ICD-10-CM

## 2022-10-25 NOTE — Progress Notes (Signed)
THIS ENCOUNTER IS A VIRTUAL VISIT DUE TO COVID-19 - PATIENT WAS NOT SEEN IN THE OFFICE.  PATIENT HAS CONSENTED TO VIRTUAL VISIT / TELEMEDICINE VISIT   Virtual Visit via telephone Note  I connected with  Isabel Tyler on 10/25/2022 by telephone.  I verified that I am speaking with the correct person using two identifiers.    I discussed the limitations of evaluation and management by telemedicine and the availability of in person appointments. The patient expressed understanding and agreed to proceed.  History of Present Illness:  BP 118/70   72 y.o. patient contacted office reporting URI sx HA, congestion, cough, fatigue, chills, yellow phlegm, loss/different taste. She tested positive by home test 10/19/22. Her symptoms have improved to now only having sinus congestion and fullness in ears.  She reports starting to feel more at baseline last night and this morning.  OV was conducted by telephone to minimize exposure. This patient has been vaccinated for covid 19, last 06/2020  Sx began 10/18/22 days ago with ear pain.  Treatments tried so far: OTC Sinus and Cold.  Exposures: Daughter   Medications  Current Outpatient Medications (Endocrine & Metabolic):    levothyroxine (SYNTHROID) 75 MCG tablet, Take 1 tablet BY MOUTH Daily on an Empty Stomach with only water for 30 minutes & No Antacid meds, Calcium or Magnesium for 4 hours and AVOID Biotin  Current Outpatient Medications (Cardiovascular):    metoprolol succinate (TOPROL-XL) 25 MG 24 hr tablet, TAKE 1/2 TABLET(12.5 MG) BY MOUTH DAILY   rosuvastatin (CRESTOR) 20 MG tablet, Take  1 tablet  3 x  /week  for Cholesterol   Current Outpatient Medications (Analgesics):    aspirin EC 81 MG tablet, Take 81 mg by mouth daily.   Current Outpatient Medications (Other):    Ascorbic Acid (VITAMIN C) 1000 MG tablet, Take 1,000 mg by mouth daily.   Calcium Citrate (CITRACAL PO), Take 1 tablet by mouth daily.   Cholecalciferol (VITAMIN  D-3) 5000 units TABS, Take 1 tablet by mouth daily.   escitalopram (LEXAPRO) 20 MG tablet, Take  1 tablet  Daily  for Mood                                                       /                                TAKE                    BY                   MOUTH   Flaxseed, Linseed, (FLAXSEED OIL) 1200 MG CAPS, Take 1 capsule by mouth daily.   Melatonin 5 MG/ML LIQD, Take 5 mg by mouth at bedtime.   Multiple Vitamins-Minerals (PRESERVISION AREDS PO), Take by mouth 2 (two) times a week.   Probiotic Product (PROBIOTIC-10) CAPS, Take 1 capsule by mouth daily.   tobramycin-dexamethasone (TOBRADEX) ophthalmic solution, 1 drop 2 (two) times daily.   busPIRone (BUSPAR) 5 MG tablet, Take 1 tablet (5 mg total) by mouth 3 (three) times daily. (Patient not taking: Reported on 10/25/2022)  Allergies:  Allergies  Allergen Reactions   Ciprofloxacin Other (See Comments)    Alleged tendonitis  Z-Pak [Azithromycin] Other (See Comments)    Patient reports that the reaction was GI upset issues.    Problem list She has Hyperlipidemia, mixed; Hypothyroidism; Major depressive disorder, recurrent, in full remission ([place]); Diverticulosis of colon without hemorrhage; Hepatic hemangioma, Rt lobe (7 cm) Jan 2017; Gastroesophageal reflux disease; Vitamin D deficiency; Abnormal glucose; Palpitations; Lumbar spinal stenosis; Trigger thumb, left thumb; Rosacea; Osteoporosis; and COPD (chronic obstructive pulmonary disease) ([place]) on their problem list.   Social History:   reports that she has never smoked. She has never used smokeless tobacco. She reports current alcohol use. She reports that she does not use drugs.  Observations/Objective:  General : Well sounding patient in no apparent distress HEENT: no hoarseness, no cough for duration of visit Lungs: speaks in complete sentences, no audible wheezing, no apparent distress Neurological: alert, oriented x 3 Psychiatric: pleasant, judgement appropriate   Assessment  and Tyler:  Covid 19 Covid 19 positive per rapid screening test in home Risk factors include: Hyperlipidemia, mixed; Hypothyroidism; Major depressive disorder, recurrent, in full remission ([place]); Diverticulosis of colon without hemorrhage; Hepatic hemangioma, Rt lobe (7 cm) Jan 2017; Gastroesophageal reflux disease; Vitamin D deficiency; Abnormal glucose; Palpitations; Lumbar spinal stenosis; Trigger thumb, left thumb; Rosacea; Osteoporosis; and COPD (chronic obstructive pulmonary disease) ([facility]) on their problem list. Symptoms are: mild Due to co morbid conditions and risk factors, discussed antivirals  Immue support reviewed Vitamin C, Vitamin D, Zinc Take tylenol PRN temp 101+ Push hydration Regular ambulation or calf exercises exercises for clot prevention and 81 mg ASA unless contraindicated Sx supportive therapy suggested Follow up via mychart or telephone if needed Advised patient obtain O2 monitor; present to ED if persistently <90% or with severe dyspnea, CP, fever uncontrolled by tylenol, confusion, sudden decline       Should remain in isolation 5 days from testing positive and then wear a mask when around other people for the following 5 days   Follow Up Instructions:  I discussed the assessment and treatment Tyler with the patient. The patient was provided an opportunity to ask questions and all were answered. The patient agreed with the Tyler and demonstrated an understanding of the instructions.   The patient was advised to call back or seek an in-person evaluation if the symptoms worsen or if the condition fails to improve as anticipated.  I provided 15 minutes of non-face-to-face time during this encounter.   Darrol Jump, NP

## 2022-10-27 ENCOUNTER — Telehealth: Payer: Self-pay | Admitting: Internal Medicine

## 2022-10-27 NOTE — Telephone Encounter (Signed)
Pt calling to discuss CT cardiac results

## 2022-10-27 NOTE — Telephone Encounter (Signed)
The patient has been notified of the result and verbalized understanding.  All questions (if any) were answered. Gershon Crane, LPN [id number] AM

## 2022-10-30 ENCOUNTER — Other Ambulatory Visit: Payer: Self-pay | Admitting: Internal Medicine

## 2022-10-30 DIAGNOSIS — F3341 Major depressive disorder, recurrent, in partial remission: Secondary | ICD-10-CM

## 2022-11-20 DIAGNOSIS — H01001 Unspecified blepharitis right upper eyelid: Secondary | ICD-10-CM | POA: Diagnosis not present

## 2022-11-20 DIAGNOSIS — H01002 Unspecified blepharitis right lower eyelid: Secondary | ICD-10-CM | POA: Diagnosis not present

## 2022-11-20 DIAGNOSIS — H01004 Unspecified blepharitis left upper eyelid: Secondary | ICD-10-CM | POA: Diagnosis not present

## 2022-11-20 DIAGNOSIS — H01005 Unspecified blepharitis left lower eyelid: Secondary | ICD-10-CM | POA: Diagnosis not present

## 2022-11-28 ENCOUNTER — Encounter: Payer: Medicare Other | Admitting: Internal Medicine

## 2022-11-29 ENCOUNTER — Encounter: Payer: Self-pay | Admitting: Internal Medicine

## 2022-11-29 NOTE — Progress Notes (Unsigned)
Comprehensive Evaluation &  Examination     Future Appointments  Date Time Provider Department  11/30/2022 10:00 AM Unk Pinto, MD [facility]  03/08/2023  9:30 AM Alycia Rossetti, NP [facility]  12/07/2023 10:00 AM Unk Pinto, MD [facility]          This very nice 72 y.o. MWF with presents for a comprehensive evaluation and management of multiple medical co-morbidities.  Patient has been followed for HTN, HLD, Prediabetes  and Vitamin D Deficiency.  Patient has been stable  on Lexapro for  Dysthymia (2003) .         Labile HTN predates since  2000.  Patient has hx /o neg stress tests x 2 in 2000 & 2015. Patient's BP has been controlled at home and patient denies any cardiac symptoms as chest pain, palpitations, shortness of breath, dizziness or ankle swelling. Today's BP is at goal -  122/78 .       Patient's hyperlipidemia is controlled with diet and g Crestor. Patient denies myalgias or other medication SE's. Last lipids were at goal :  Lab Results  Component Value Date   CHOL 168 08/31/2022   HDL 73 08/31/2022   LDLCALC 79 08/31/2022   TRIG 80 08/31/2022   CHOLHDL 2.3 08/31/2022         Patient was initiated on Thyroid Replacement in 2000.       Patient  is monitored expectantly for glucose intolerance  and patient denies reactive hypoglycemic symptoms, visual blurring, diabetic polys or paresthesias. Last A1c was normal & at goal :  Lab Results  Component Value Date   HGBA1C 5.4 04/04/2021         Finally, patient has history of Vitamin D Deficiency ("25" /2017) and last Vitamin D was at goal :  Lab Results  Component Value Date   VD25OH 77 04/04/2021     Current Outpatient Medications on File Prior to Visit  Medication Sig   alendronate  70 MG tablet TAKE 1 TABLET  EVERY WEEK    Ascorbic Acid (VITAMIN C) 1000 MG tablet Take 1,000 mg  daily.   aspirin EC 81 MG tablet Take daily.   Calcium Citrate (CITRACAL ) Take 1 tablet by mouth daily.    cetirizine10 MG tablet Take 10 mg daily.   VITAMIN D  5000 units TABS Take 1 tablet  daily.   escitalopram  20 MG tablet TAKE 1 TABLET BY DAILY   FLAXSEED OIL 1200 MG CAPS Take 1 capsule  daily.   Levothyroxine 75 MCG tablet Take      1 tablet Daily      metoprolol succinate XL 25 MG  Take 0.5 tablets (12.5 mg total) daily.   PROBIOTIC-10 CAPS Take 1 capsule  daily.   rosuvastatin  20 MG tablet Take  1 tablet  3 x  /week  for Cholesterol    Allergies  Allergen Reactions   Ciprofloxacin Other (See Comments)    Alleged tendonitis   Z-Pak [Azithromycin] GI upset issues.    \ Past Medical History:  Diagnosis Date   Anxiety    Diverticular disease    GERD (gastroesophageal reflux disease)    Hx of diverticulitis of colon 04/04/2017   Hyperlipidemia, mixed    Hypothyroidism 2000   Vitamin D deficiency      Health Maintenance  Topic Date Due   Zoster Vaccines- Shingrix (1 of 2) Never done   Pneumonia Vaccine (2 - PPSV23 if available, else PCV20) 06/07/2019  MAMMOGRAM  03/18/2020   COVID-19 Vaccine (4 - Booster for Pfizer series) 08/23/2020   TETANUS/TDAP  12/30/2030   COLONOSCOPY  01/18/2031   INFLUENZA VACCINE  Completed   DEXA SCAN  Completed   Hepatitis C Screening  Completed   HPV VACCINES  Aged Out     Immunization History  Administered Date(s) Administered   Influenza Inj Mdck Quad  07/27/2017   Influenza, High Dose  07/16/2018   Influenza,inj,Quad PF,6+ Mos 07/26/2021   PFIZER SARS-COV-2 Vacc 10/04/2019, 10/30/2019, 06/28/2020   PPD Test 04/05/2017   Pneumococcal -13 06/06/2018   Td 12/29/2020   Tdap 08/28/2010    Last Colon - 2015   Last MGM -  in spring with Dr Kathryne Eriksson    Past Surgical History:  Procedure Laterality Date   BREAST BIOPSY Left    benign   CHOLECYSTECTOMY N/A 02/08/2017   Procedure: LAPAROSCOPIC CHOLECYSTECTOMY WITH INTRAOPERATIVE CHOLANGIOGRAM;  Surgeon: Donnie Mesa, MD;  Location: [facility];  Service: General;  Laterality:  N/A;   EYE SURGERY     Eyelid lifted   TONSILLECTOMY       Family History  Problem Relation Age of Onset   Heart disease Mother    Heart disease Father    Breast cancer Sister        around age 30   Stomach cancer Neg Hx    Colon cancer Neg Hx    Esophageal cancer Neg Hx    Pancreatic cancer Neg Hx    Rectal cancer Neg Hx      Social History   Tobacco Use   Smoking status: Never   Smokeless tobacco: Never  Vaping Use   Vaping Use: Never used  Substance Use Topics   Alcohol use: Yes    Comment: occasional    Drug use: No      ROS Constitutional: Denies fever, chills, weight loss/gain, headaches, insomnia,  night sweats, and change in appetite. Does c/o fatigue. Eyes: Denies redness, blurred vision, diplopia, discharge, itchy, watery eyes.  ENT: Denies discharge, congestion, post nasal drip, epistaxis, sore throat, earache, hearing loss, dental pain, Tinnitus, Vertigo, Sinus pain, snoring.  Cardio: Denies chest pain, palpitations, irregular heartbeat, syncope, dyspnea, diaphoresis, orthopnea, PND, claudication, edema Respiratory: denies cough, dyspnea, DOE, pleurisy, hoarseness, laryngitis, wheezing.  Gastrointestinal: Denies dysphagia, heartburn, reflux, water brash, pain, cramps, nausea, vomiting, bloating, diarrhea, constipation, hematemesis, melena, hematochezia, jaundice, hemorrhoids Genitourinary: Denies dysuria, frequency, urgency, nocturia, hesitancy, discharge, hematuria, flank pain Breast: Breast lumps, nipple discharge, bleeding.  Musculoskeletal: Denies arthralgia, myalgia, stiffness, Jt. Swelling, pain, limp, and strain/sprain. Denies falls. Skin: Denies puritis, rash, hives, warts, acne, eczema, changing in skin lesion Neuro: No weakness, tremor, incoordination, spasms, paresthesia, pain Psychiatric: Denies confusion, memory loss, sensory loss. Denies Depression. Endocrine: Denies change in weight, skin, hair change, nocturia, and paresthesia, diabetic  polys, visual blurring, hyper / hypo glycemic episodes.  Heme/Lymph: No excessive bleeding, bruising, enlarged lymph nodes.  Physical Exam  BP 122/78   Pulse 76   Temp 97.9 F (36.6 C)   Resp 16   Ht 5' (1.524 m)   Wt 115 lb (52.2 kg)   SpO2 96%   BMI 22.46 kg/m   General Appearance: Well nourished, well groomed and in no apparent distress.  Eyes: PERRLA, EOMs, conjunctiva no swelling or erythema, normal fundi and vessels. Sinuses: No frontal/maxillary tenderness ENT/Mouth: EACs patent / TMs  nl. Nares clear without erythema, swelling, mucoid exudates. Oral hygiene is good. No erythema, swelling, or exudate. Tongue normal, non-obstructing.  Tonsils not swollen or erythematous. Hearing normal.  Neck: Supple, thyroid not palpable. No bruits, nodes or JVD. Respiratory: Respiratory effort normal.  BS equal and clear bilateral without rales, rhonci, wheezing or stridor. Cardio: Heart sounds are normal with regular rate and rhythm and no murmurs, rubs or gallops. Peripheral pulses are normal and equal bilaterally without edema. No aortic or femoral bruits. Chest: symmetric with normal excursions and percussion. Breasts: Symmetric, without lumps, nipple discharge, retractions, or fibrocystic changes.  Abdomen: Flat, soft with bowel sounds active. Nontender, no guarding, rebound, hernias, masses, or organomegaly.  Lymphatics: Non tender without lymphadenopathy.  Genitourinary:  Musculoskeletal: Full ROM all peripheral extremities, joint stability, 5/5 strength, and normal gait. Skin: Warm and dry without rashes, lesions, cyanosis, clubbing or  ecchymosis.  Neuro: Cranial nerves intact, reflexes equal bilaterally. Normal muscle tone, no cerebellar symptoms. Sensation intact.  Pysch: Alert and oriented X 3, normal affect, Insight and Judgment appropriate.    Assessment and Plan   1. Labile hypertension  - EKG 12-Lead - Urinalysis, Routine w reflex microscopic - Microalbumin /  creatinine urine ratio - CBC with Differential/Platelet - COMPLETE METABOLIC PANEL WITH GFR - Magnesium - TSH  2. Hyperlipidemia, mixed  - EKG 12-Lead - Lipid panel - TSH  3. Abnormal glucose  - EKG 12-Lead - Hemoglobin A1c - Insulin, random  4. Vitamin D deficiency  - EKG 12-Lead - VITAMIN D 25 Hydroxy   5. Hypothyroidism  - TSH  6. Gastroesophageal reflux disease  - CBC with Differential/Platelet  7. Iron deficiency  - Iron, Total/Total Iron Binding Cap  8. B12 deficiency  - Vitamin B12  9. Screening for colorectal cancer  - POC Hemoccult Bld/Stl    10. Screening for heart disease  - EKG 12-Lead  11. FHx: heart disease  - EKG 12-Lead - TSH - Insulin, random - VITAMIN D 25 Hydroxy  12. Medication management  - CBC with Differential/Platelet - COMPLETE METABOLIC PANEL WITH GFR - Magnesium - Lipid panel - TSH - Hemoglobin A1c - Insulin, random - VITAMIN D 25 Hydroxy           Patient was counseled in prudent diet to achieve/maintain BMI less than 25 for weight control, BP monitoring, regular exercise and medications. Discussed med's effects and SE's. Screening labs and tests as requested with regular follow-up as recommended. Over 40 minutes of exam, counseling, chart review and high complex critical decision making was performed.   Kirtland Bouchard, MD

## 2022-11-29 NOTE — Patient Instructions (Signed)

## 2022-11-30 ENCOUNTER — Encounter: Payer: Self-pay | Admitting: Internal Medicine

## 2022-11-30 ENCOUNTER — Ambulatory Visit (INDEPENDENT_AMBULATORY_CARE_PROVIDER_SITE_OTHER): Payer: Medicare Other | Admitting: Internal Medicine

## 2022-11-30 VITALS — BP 122/78 | HR 76 | Temp 97.9°F | Resp 16 | Ht 60.0 in | Wt 115.0 lb

## 2022-11-30 DIAGNOSIS — R0989 Other specified symptoms and signs involving the circulatory and respiratory systems: Secondary | ICD-10-CM

## 2022-11-30 DIAGNOSIS — R7309 Other abnormal glucose: Secondary | ICD-10-CM

## 2022-11-30 DIAGNOSIS — Z136 Encounter for screening for cardiovascular disorders: Secondary | ICD-10-CM

## 2022-11-30 DIAGNOSIS — E782 Mixed hyperlipidemia: Secondary | ICD-10-CM

## 2022-11-30 DIAGNOSIS — Z79899 Other long term (current) drug therapy: Secondary | ICD-10-CM

## 2022-11-30 DIAGNOSIS — E538 Deficiency of other specified B group vitamins: Secondary | ICD-10-CM

## 2022-11-30 DIAGNOSIS — E611 Iron deficiency: Secondary | ICD-10-CM

## 2022-11-30 DIAGNOSIS — K219 Gastro-esophageal reflux disease without esophagitis: Secondary | ICD-10-CM

## 2022-11-30 DIAGNOSIS — Z1211 Encounter for screening for malignant neoplasm of colon: Secondary | ICD-10-CM

## 2022-11-30 DIAGNOSIS — E559 Vitamin D deficiency, unspecified: Secondary | ICD-10-CM | POA: Diagnosis not present

## 2022-11-30 DIAGNOSIS — E039 Hypothyroidism, unspecified: Secondary | ICD-10-CM

## 2022-11-30 DIAGNOSIS — Z8249 Family history of ischemic heart disease and other diseases of the circulatory system: Secondary | ICD-10-CM | POA: Diagnosis not present

## 2022-12-01 LAB — MAGNESIUM: Magnesium: 2.1 mg/dL (ref 1.5–2.5)

## 2022-12-01 LAB — URINALYSIS, ROUTINE W REFLEX MICROSCOPIC
Bilirubin Urine: NEGATIVE
Glucose, UA: NEGATIVE
Hgb urine dipstick: NEGATIVE
Ketones, ur: NEGATIVE
Leukocytes,Ua: NEGATIVE
Nitrite: NEGATIVE
Protein, ur: NEGATIVE
Specific Gravity, Urine: 1.016 (ref 1.001–1.035)
pH: 6.5 (ref 5.0–8.0)

## 2022-12-01 LAB — COMPLETE METABOLIC PANEL WITH GFR
AG Ratio: 1.7 (calc) (ref 1.0–2.5)
ALT: 21 U/L (ref 6–29)
AST: 22 U/L (ref 10–35)
Albumin: 4.3 g/dL (ref 3.6–5.1)
Alkaline phosphatase (APISO): 53 U/L (ref 37–153)
BUN: 21 mg/dL (ref 7–25)
CO2: 29 mmol/L (ref 20–32)
Calcium: 9.7 mg/dL (ref 8.6–10.4)
Chloride: 103 mmol/L (ref 98–110)
Creat: 0.7 mg/dL (ref 0.50–1.05)
Globulin: 2.5 g/dL (calc) (ref 1.9–3.7)
Glucose, Bld: 87 mg/dL (ref 65–99)
Potassium: 4.7 mmol/L (ref 3.5–5.3)
Sodium: 140 mmol/L (ref 135–146)
Total Bilirubin: 0.4 mg/dL (ref 0.2–1.2)
Total Protein: 6.8 g/dL (ref 6.1–8.1)
eGFR: 94 mL/min/{1.73_m2} (ref >=60)

## 2022-12-01 LAB — TSH: TSH: 3.17 mIU/L (ref 0.40–4.50)

## 2022-12-01 LAB — CBC WITH DIFFERENTIAL/PLATELET
Absolute Monocytes: 351 cells/uL (ref 200–950)
Basophils Absolute: 81 cells/uL (ref 0–200)
Basophils Relative: 1.5 %
Eosinophils Absolute: 38 cells/uL (ref 15–500)
Eosinophils Relative: 0.7 %
HCT: 37.9 % (ref 35.0–45.0)
Hemoglobin: 12.4 g/dL (ref 11.7–15.5)
Lymphs Abs: 1582.2 cells/uL (ref 850–3900)
MCH: 31.4 pg (ref 27.0–33.0)
MCHC: 32.7 g/dL (ref 32.0–36.0)
MCV: 95.9 fL (ref 80.0–100.0)
MPV: 9.9 fL (ref 7.5–12.5)
Monocytes Relative: 6.5 %
Neutro Abs: 3348 cells/uL (ref 1500–7800)
Neutrophils Relative %: 62 %
Platelets: 272 10*3/uL (ref 140–400)
RBC: 3.95 10*6/uL (ref 3.80–5.10)
RDW: 11.8 % (ref 11.0–15.0)
Total Lymphocyte: 29.3 %
WBC: 5.4 10*3/uL (ref 3.8–10.8)

## 2022-12-01 LAB — VITAMIN D 25 HYDROXY (VIT D DEFICIENCY, FRACTURES): Vit D, 25-Hydroxy: 77 ng/mL (ref 30–100)

## 2022-12-01 LAB — LIPID PANEL
Cholesterol: 190 mg/dL (ref <200)
HDL: 74 mg/dL (ref >=50)
LDL Cholesterol (Calc): 97 mg/dL (calc)
Non-HDL Cholesterol (Calc): 116 mg/dL (calc) (ref <130)
Total CHOL/HDL Ratio: 2.6 (calc) (ref <5.0)
Triglycerides: 98 mg/dL (ref <150)

## 2022-12-01 LAB — MICROALBUMIN / CREATININE URINE RATIO
Creatinine, Urine: 51 mg/dL (ref 20–275)
Microalb, Ur: <0.2 mg/dL

## 2022-12-01 LAB — VITAMIN B12: Vitamin B-12: 552 pg/mL (ref 200–1100)

## 2022-12-01 LAB — HEMOGLOBIN A1C
Hgb A1c MFr Bld: 5.6 % of total Hgb (ref <5.7)
Mean Plasma Glucose: 114 mg/dL
eAG (mmol/L): 6.3 mmol/L

## 2022-12-01 LAB — IRON, TOTAL/TOTAL IRON BINDING CAP
%SAT: 34 % (calc) (ref 16–45)
Iron: 97 ug/dL (ref 45–160)
TIBC: 289 mcg/dL (calc) (ref 250–450)

## 2022-12-01 LAB — INSULIN, RANDOM: Insulin: 17.1 u[IU]/mL

## 2022-12-02 NOTE — Progress Notes (Signed)
<><><><><><><><><><><><><><><><><><><><><><><><><><><><><><><><><> <><><><><><><><><><><><><><><><><><><><><><><><><><><><><><><><><> -   Test results slightly outside the reference range are not unusual. If there is anything important, I will review this with you,  otherwise it is considered normal test values.  If you have further questions,  please do not hesitate to contact me at the office or via My Chart.  <><><><><><><><><><><><><><><><><><><><><><><><><><><><><><><><><> <><><><><><><><><><><><><><><><><><><><><><><><><><><><><><><><><>  -  Iron Levels & Vitamin B12 levels - both Normal & OK  <><><><><><><><><><><><><><><><><><><><><><><><><><><><><><><><><>  -  Chol =  190   &    LDL Chol = 97    -  both  Excellent   - Very low risk for Heart Attack  / Stroke <><><><><><><><><><><><><><><><><><><><><><><><><><><><><><><><><>  - A1c back to Normal 5.6%  ( less than 5.7%)  - No Diabetes  - Great  !   <><><><><><><><><><><><><><><><><><><><><><><><><><><><><><><><><>  - Vitamin D = 77 - Excellent  - Please keep dosage  Same  <><><><><><><><><><><><><><><><><><><><><><><><><><><><><><><><><> <><><><><><><><><><><><><><><><><><><><><><><><><><><><><><><><><>  -  All Else - CBC - Kidneys - Electrolytes - Liver - Magnesium & Thyroid    - all  Normal / OK  <><><><><><><><><><><><><><><><><><><><><><><><><><><><><><><><><> <><><><><><><><><><><><><><><><><><><><><><><><><><><><><><><><><>

## 2022-12-06 DIAGNOSIS — L821 Other seborrheic keratosis: Secondary | ICD-10-CM | POA: Diagnosis not present

## 2022-12-06 DIAGNOSIS — W908XXS Exposure to other nonionizing radiation, sequela: Secondary | ICD-10-CM | POA: Diagnosis not present

## 2022-12-06 DIAGNOSIS — L578 Other skin changes due to chronic exposure to nonionizing radiation: Secondary | ICD-10-CM | POA: Diagnosis not present

## 2022-12-28 ENCOUNTER — Other Ambulatory Visit: Payer: Self-pay

## 2022-12-28 DIAGNOSIS — Z1211 Encounter for screening for malignant neoplasm of colon: Secondary | ICD-10-CM

## 2022-12-28 DIAGNOSIS — Z1212 Encounter for screening for malignant neoplasm of rectum: Secondary | ICD-10-CM | POA: Diagnosis not present

## 2022-12-28 LAB — POC HEMOCCULT BLD/STL (HOME/3-CARD/SCREEN)
Card #2 Fecal Occult Blod, POC: NEGATIVE
Card #3 Fecal Occult Blood, POC: NEGATIVE
Fecal Occult Blood, POC: NEGATIVE

## 2023-01-02 DIAGNOSIS — H6123 Impacted cerumen, bilateral: Secondary | ICD-10-CM | POA: Diagnosis not present

## 2023-01-02 DIAGNOSIS — Z681 Body mass index (BMI) 19 or less, adult: Secondary | ICD-10-CM | POA: Diagnosis not present

## 2023-01-08 ENCOUNTER — Other Ambulatory Visit: Payer: Self-pay | Admitting: Internal Medicine

## 2023-01-08 ENCOUNTER — Other Ambulatory Visit: Payer: Self-pay | Admitting: Nurse Practitioner

## 2023-01-08 DIAGNOSIS — E782 Mixed hyperlipidemia: Secondary | ICD-10-CM

## 2023-01-08 MED ORDER — ROSUVASTATIN CALCIUM 20 MG PO TABS: ORAL_TABLET | ORAL | 3 refills | Status: DC

## 2023-01-24 ENCOUNTER — Ambulatory Visit: Payer: Medicare Other | Admitting: Adult Health

## 2023-03-08 ENCOUNTER — Ambulatory Visit: Payer: Medicare Other | Admitting: Nurse Practitioner

## 2023-04-03 DIAGNOSIS — H903 Sensorineural hearing loss, bilateral: Secondary | ICD-10-CM | POA: Diagnosis not present

## 2023-04-03 DIAGNOSIS — H838X3 Other specified diseases of inner ear, bilateral: Secondary | ICD-10-CM | POA: Diagnosis not present

## 2023-04-03 DIAGNOSIS — H9202 Otalgia, left ear: Secondary | ICD-10-CM | POA: Diagnosis not present

## 2023-04-03 DIAGNOSIS — M26609 Unspecified temporomandibular joint disorder, unspecified side: Secondary | ICD-10-CM | POA: Diagnosis not present

## 2023-04-04 ENCOUNTER — Ambulatory Visit: Payer: Medicare Other | Admitting: Nurse Practitioner

## 2023-04-05 ENCOUNTER — Ambulatory Visit: Payer: Medicare Other | Admitting: Nurse Practitioner

## 2023-04-05 NOTE — Progress Notes (Signed)
MEDICARE ANNUAL WELLNESS VISIT AND 3 MONTH FOLLOW UP  Assessment:   [name] was seen today for medicare wellness.  Diagnoses and all orders for this visit:  Annual Medicare Wellness Visit Due annually  Health maintenance reviewed  Palpitations On toprol 25 mg 1/2 tab dailyl via cardiology Dr. Tenny Craw Denies recent sx  Hypothyroidism, unspecified type continue medications the same pending lab results- levothyroxine 75 mcg every day  reminded to take on an empty stomach 30-19mins before food.  check TSH level  Diverticulosis of colon without hemorrhage Doing well on current regimen Continue probiotic  Gastroesophageal reflux disease, esophagitis presence not specified Well managed off of meds at this time Discussed diet, avoiding triggers and other lifestyle changes  Hyperlipidemia, mixed Continue on Crestor 20mg  three days a week Recently at goal  Check lipids  COPD (HCC) Per CXR 2018; never smoker, asymptomatic, not on inhalers, monitor   Depression, recurrent in remission (HCC) anxiety Doing well on lexapro hasn't tolerated taper Continue with benefit PQ2, negative  FHx: heart disease Control blood pressure, cholesterol, glucose, increase exercise.   Vitamin D deficiency Continue supplementation  Osteoporosis, unspecified  getting via GYN, reports requested   Continue Fosamax, doing well with this Teach back provided regarding proper medication administration Continue Calcium & Vit D supplements  Skin lesion of left upper extremity Appears to possibly be a bruise Monitor and if not resolved in next 7 days notify the office and will refer to derm- is established at Dr. Katrinka Blazing in [place]  Hearing loss bilaterally Continue to use hearing aids and follow with ENT as needed  Abnormal glucose Discussed disease progression and risks Discussed diet/exercise, weight management and risk modification  Hepatic Hemangioma  Monitor, follow up with GI   Spinal  stenosis  Recently improved; monitor   Abdominal cramping Use levsin 0.125 mg every 4 hours as needed Monitor and if worsens notify the office   Over 40 minutes of exam, counseling, chart review and critical decision making was performed Future Appointments  Date Time Provider Department Center  06/14/2023 10:30 AM Lucky Cowboy, MD [facility] None  09/18/2023 10:30 AM Adela Glimpse, NP [facility] None  12/18/2023 11:00 AM Lucky Cowboy, MD [facility] None  04/10/2024  3:00 PM Raynelle Dick, NP [facility] None     Plan:   During the course of the visit the patient was educated and counseled about appropriate screening and preventive services including:   Pneumococcal vaccine  Prevnar 13 Influenza vaccine Td vaccine Screening electrocardiogram Bone densitometry screening Colorectal cancer screening Diabetes screening Glaucoma screening Nutrition counseling  Advanced directives: requested   Subjective:  Isabel Tyler is a 72 y.o. female who presents for Medicare Annual Wellness Visit and 3 month follow up. She has Hyperlipidemia, mixed; Hypothyroidism; Major depressive disorder, recurrent, in full remission (HCC); Diverticulosis of colon without hemorrhage; Hepatic hemangioma, Rt lobe (7 cm) Jan 2017; Gastroesophageal reflux disease; Vitamin D deficiency; Abnormal glucose; Palpitations; Lumbar spinal stenosis; Trigger thumb, left thumb; Rosacea; Osteoporosis; and COPD (chronic obstructive pulmonary disease) (HCC) on their problem list.  Watches her grandkids 1/2 day 4 days a week.   She does have issues with IBS- has a lot of cramping and more frequent BM's  She has a diagnosis of recurrent major depression/anxiety and is currently on lexapro 20 mg daily, reports symptoms are well controlled on current regimen. She has tried to taper off several times but hasn't done well.   She has noticed a small ecchymotic area on left forearm/wrist - approx  1-2 cm, mildly  tender if touched.   Hemangioma, 7 cm of R lobe, noted 08/2015 on imaging , had been declining follow up. Is est with GI Dr. Rhea Belton.   She has been having epigastric sx; EKG in office showed possible a. Fib, cardiology Dr. Tenny Craw felt more atrial bigeminy, had holter 09/2020 showing NSR with PACs, rare brief SVT, had normal ECHO other than mild MR. She was initiated on toprol 12.5 mg daily and doing well.   Osteoporosis followed by GYN , currently on Fosamax vacation.   BMI is Body mass index is 22.5 kg/m., she has been working on diet and exercise, doing silver sneakers 1 hour 3 days a week.  Wt Readings from Last 3 Encounters:  04/09/23 115 lb 3.2 oz (52.3 kg)  11/30/22 115 lb (52.2 kg)  10/10/22 118 lb 12.8 oz (53.9 kg)    Her blood pressure has been controlled at home, today their BP is BP: 110/68 . Palpitations are controlled on Metoprolol 25 mg 1/2 tab daily  BP Readings from Last 3 Encounters:  04/09/23 110/68  11/30/22 122/78  10/25/22 118/70  She does workout. She denies chest pain, shortness of breath, dizziness.    She is on cholesterol medication, Rosuvastatin 20 mg three days a week and denies myalgias. Her cholesterol is at goal. The cholesterol last visit was:   Lab Results  Component Value Date   CHOL 190 11/30/2022   HDL 74 11/30/2022   LDLCALC 97 11/30/2022   TRIG 98 11/30/2022   CHOLHDL 2.6 11/30/2022    Last A1C in the office was:  Lab Results  Component Value Date   HGBA1C 5.6 11/30/2022   Last GFR: Lab Results  Component Value Date   EGFR 94 11/30/2022   She is on thyroid medication. Her medication was not changed last visit.   Lab Results  Component Value Date   TSH 3.17 11/30/2022  . Patient is on Vitamin D supplement.   Lab Results  Component Value Date   VD25OH 77 11/30/2022      Medication Review: Current Outpatient Medications on File Prior to Visit  Medication Sig Dispense Refill   Ascorbic Acid (VITAMIN C) 1000 MG tablet Take 1,000 mg  by mouth daily.     aspirin EC 81 MG tablet Take 81 mg by mouth daily.     Calcium Citrate (CITRACAL PO) Take 1 tablet by mouth daily.     Cholecalciferol (VITAMIN D-3) 5000 units TABS Take 1 tablet by mouth daily.     escitalopram (LEXAPRO) 20 MG tablet Take 1 tablet by mouth Daily for Mood 90 tablet 3   Flaxseed, Linseed, (FLAXSEED OIL) 1200 MG CAPS Take 1 capsule by mouth daily.     levothyroxine (SYNTHROID) 75 MCG tablet Take 1 tablet BY MOUTH Daily on an Empty Stomach with only water for 30 minutes & No Antacid meds, Calcium or Magnesium for 4 hours and AVOID Biotin 90 tablet 3   Melatonin 5 MG/ML LIQD Take 5 mg by mouth at bedtime. PRN     metoprolol succinate (TOPROL-XL) 25 MG 24 hr tablet TAKE 1/2 TABLET(12.5 MG) BY MOUTH DAILY 45 tablet 2   Probiotic Product (PROBIOTIC-10) CAPS Take 1 capsule by mouth daily.     rosuvastatin (CRESTOR) 20 MG tablet Take  1 tablet  3 x  /week  for Cholesterol 39 tablet 3   busPIRone (BUSPAR) 5 MG tablet Take 1 tablet (5 mg total) by mouth 3 (three) times daily. (Patient not taking:  Reported on 10/25/2022) 90 tablet 4   Multiple Vitamins-Minerals (PRESERVISION AREDS PO) Take by mouth 2 (two) times a week. (Patient not taking: Reported on 04/09/2023)     tobramycin-dexamethasone (TOBRADEX) ophthalmic solution 1 drop 2 (two) times daily. (Patient not taking: Reported on 11/30/2022)     No current facility-administered medications on file prior to visit.    Allergies  Allergen Reactions   Ciprofloxacin Other (See Comments)    Alleged tendonitis   Z-Pak [Azithromycin] Other (See Comments)    Patient reports that the reaction was GI upset issues.    Current Problems (verified) Patient Active Problem List   Diagnosis Date Noted   COPD (chronic obstructive pulmonary disease) (HCC) 11/11/2019   Osteoporosis 10/07/2019   Rosacea 12/12/2018   Trigger thumb, left thumb 09/26/2018   Lumbar spinal stenosis 09/03/2018   Vitamin D deficiency 06/06/2018    Abnormal glucose 06/06/2018   Palpitations 06/06/2018   Gastroesophageal reflux disease 04/04/2017   Hepatic hemangioma, Rt lobe (7 cm) Jan 2017 12/12/2016   Hyperlipidemia, mixed 10/23/2016   Hypothyroidism 10/23/2016   Major depressive disorder, recurrent, in full remission (HCC) 10/23/2016   Diverticulosis of colon without hemorrhage 10/23/2016    Screening Tests Immunization History  Administered Date(s) Administered   Influenza Inj Mdck Quad With Preservative 07/27/2017   Influenza, High Dose Seasonal PF 07/16/2018   Influenza,inj,Quad PF,6+ Mos 07/26/2021, 08/31/2022   PFIZER(Purple Top)SARS-COV-2 Vaccination 10/04/2019, 10/30/2019, 06/28/2020   PPD Test 04/05/2017   Pneumococcal Conjugate-13 06/06/2018   Pneumococcal Polysaccharide-23 01/17/2022   Td 12/29/2020   Tdap 08/28/2010   Health Maintenance  Topic Date Due   COVID-19 Vaccine (4 - 2023-24 season) 04/25/2023 (Originally 04/28/2022)   INFLUENZA VACCINE  11/26/2023 (Originally 03/29/2023)   MAMMOGRAM  07/28/2023   Medicare Annual Wellness (AWV)  04/08/2024   DTaP/Tdap/Td (3 - Td or Tdap) 12/30/2030   Colonoscopy  01/18/2031   Pneumonia Vaccine 64+ Years old  Completed   DEXA SCAN  Completed   Hepatitis C Screening  Completed   HPV VACCINES  Aged Out   Zoster Vaccines- Shingrix  Discontinued   Last colonoscopy: 01/17/2021, Dr. Rhea Belton, 10 year follow up per patient   Last mammogram: annually at GYN, Dr. Renaldo Fiddler, 06/2022 was last Last pap smear/pelvic exam: goes annually, GYN handles 06/2022 DEXA: managed at GYN, 06/2022  Dermatologist is Dr. Katrinka Blazing in [place]  titioners you currently use: 1. [facility] here for primary care 2. [facility]-01/2023 3.Dentist: Dr. Caryl Bis, 09/2022 Patient Care Team: Lucky Cowboy, MD as PCP - General (Internal Medicine) Pricilla Riffle, MD as PCP - Cardiology (Cardiology)  SURGICAL HISTORY She  has a past surgical  history that includes Breast biopsy (Left); Tonsillectomy; Eye surgery; and Cholecystectomy (N/A, 02/08/2017). FAMILY HISTORY Her family history includes Breast cancer in her sister; Heart disease in her father and mother. SOCIAL HISTORY She  reports that she has never smoked. She has never used smokeless tobacco. She reports current alcohol use. She reports that she does not use drugs.   MEDICARE WELLNESS OBJECTIVES: Physical activity:  Does silver sneakers 3 days a week.  Cardiac risk factors: Cardiac Risk Factors include: advanced age (>1men, >34 women);dyslipidemia;hypertension Depression/mood screen:      04/09/2023    4:24 PM  Depression screen PHQ 2/9  Decreased Interest 0  Down, Depressed, Hopeless 0  PHQ - 2 Score 0    ADLs:     04/09/2023    4:24 PM 04/09/2023  4:22 PM  In your present state of health, do you have any difficulty performing the following activities:  Hearing? 1 0  Comment bilateral hearing aids.   Vision?  0  Difficulty concentrating or making decisions?  0  Walking or climbing stairs?  0  Dressing or bathing?  0  Doing errands, shopping?  0     Cognitive Testing  Alert? Yes  Normal Appearance?Yes  Oriented to person? Yes  Place? Yes   Time? Yes  Recall of three objects?  Yes  Can perform simple calculations? Yes  Displays appropriate judgment?Yes  Can read the correct time from a watch face?Yes  EOL planning: Does Patient Have a Medical Advance Directive?: Yes Type of Advance Directive: Healthcare Power of Attorney, Living will Does patient want to make changes to medical advance directive?: No - Patient declined Copy of Healthcare Power of Attorney in Chart?: No - copy requested  Review of Systems  Constitutional:  Negative for malaise/fatigue and weight loss.  HENT:  Positive for hearing loss (wears bilateral hearing aids). Negative for tinnitus.        Hard of hearing- has hearing aids bilaterally  Eyes:  Negative for blurred vision and  double vision.  Respiratory:  Negative for cough, sputum production, shortness of breath and wheezing.   Cardiovascular:  Negative for chest pain, palpitations, orthopnea, claudication, leg swelling and PND.  Gastrointestinal:  Positive for abdominal pain (cramps). Negative for blood in stool, constipation, diarrhea, heartburn, melena, nausea and vomiting.  Genitourinary: Negative.   Musculoskeletal:  Negative for falls, joint pain and myalgias.  Skin:  Negative for rash.       Noticed purple discoloration left forearm today  Neurological:  Negative for dizziness, tingling, sensory change, weakness and headaches.  Endo/Heme/Allergies:  Negative for polydipsia.  Psychiatric/Behavioral: Negative.  Negative for depression, memory loss, substance abuse and suicidal ideas. The patient is not nervous/anxious and does not have insomnia.   All other systems reviewed and are negative.    Objective:     Today's Vitals   04/09/23 1552  BP: 110/68  Pulse: 66  Temp: (!) 97.5 F (36.4 C)  SpO2: 94%  Weight: 115 lb 3.2 oz (52.3 kg)  Height: 5' (1.524 m)    Body mass index is 22.5 kg/m.  General appearance: Very pleasant female in no apparent distress HEENT: normocephalic, sclerae anicteric, TMs pearly, nares patent, no discharge or erythema, pharynx normal. Bilateral hearing aids Oral cavity: MMM, no lesions Neck: supple, no lymphadenopathy, no thyromegaly, no masses Heart: RRR, normal S1, S2, no murmurs Lungs: CTA bilaterally, no wheezes, rhonchi, or rales Abdomen: +bs, soft, non tender, non distended, no masses, no hepatomegaly, no splenomegaly Musculoskeletal: nontender, no swelling, no obvious deformity Extremities: no edema, no cyanosis, no clubbing Pulses: 2+ symmetric, upper and lower extremities, normal cap refill Neurological: alert, oriented x 3, CN2-12 intact, strength normal upper extremities and lower extremities, sensation normal throughout, DTRs 2+ throughout, no cerebellar  signs, gait normal Psychiatric: normal affect, behavior normal, pleasant , oriented x 3 Skin: Warm and dry.small ecchymotic area on left forearm/wrist - approx 1-2 cm, mildly tender if touched.    Medicare Attestation I have personally reviewed: The patient's medical and social history Their use of alcohol, tobacco or illicit drugs Their current medications and supplements The patient's functional ability including ADLs,fall risks, home safety risks, cognitive, and hearing and visual impairment Diet and physical activities Evidence for depression or mood disorders  The patient's weight, height, BMI, and visual acuity  have been recorded in the chart.  I have made referrals, counseling, and provided education to the patient based on review of the above and I have provided the patient with a written personalized care plan for preventive services.     Raynelle Dick, NP   04/09/2023

## 2023-04-09 ENCOUNTER — Encounter: Payer: Self-pay | Admitting: Nurse Practitioner

## 2023-04-09 ENCOUNTER — Ambulatory Visit (INDEPENDENT_AMBULATORY_CARE_PROVIDER_SITE_OTHER): Payer: Medicare Other | Admitting: Nurse Practitioner

## 2023-04-09 VITALS — BP 110/68 | HR 66 | Temp 97.5°F | Ht 60.0 in | Wt 115.2 lb

## 2023-04-09 DIAGNOSIS — H9193 Unspecified hearing loss, bilateral: Secondary | ICD-10-CM

## 2023-04-09 DIAGNOSIS — E039 Hypothyroidism, unspecified: Secondary | ICD-10-CM | POA: Diagnosis not present

## 2023-04-09 DIAGNOSIS — E782 Mixed hyperlipidemia: Secondary | ICD-10-CM | POA: Diagnosis not present

## 2023-04-09 DIAGNOSIS — K5730 Diverticulosis of large intestine without perforation or abscess without bleeding: Secondary | ICD-10-CM | POA: Diagnosis not present

## 2023-04-09 DIAGNOSIS — R7309 Other abnormal glucose: Secondary | ICD-10-CM

## 2023-04-09 DIAGNOSIS — Z0001 Encounter for general adult medical examination with abnormal findings: Secondary | ICD-10-CM

## 2023-04-09 DIAGNOSIS — Z79899 Other long term (current) drug therapy: Secondary | ICD-10-CM | POA: Diagnosis not present

## 2023-04-09 DIAGNOSIS — Z0000 Encounter for general adult medical examination without abnormal findings: Secondary | ICD-10-CM

## 2023-04-09 DIAGNOSIS — E559 Vitamin D deficiency, unspecified: Secondary | ICD-10-CM | POA: Diagnosis not present

## 2023-04-09 DIAGNOSIS — R0989 Other specified symptoms and signs involving the circulatory and respiratory systems: Secondary | ICD-10-CM

## 2023-04-09 DIAGNOSIS — K219 Gastro-esophageal reflux disease without esophagitis: Secondary | ICD-10-CM

## 2023-04-09 DIAGNOSIS — M48061 Spinal stenosis, lumbar region without neurogenic claudication: Secondary | ICD-10-CM | POA: Diagnosis not present

## 2023-04-09 DIAGNOSIS — D1803 Hemangioma of intra-abdominal structures: Secondary | ICD-10-CM

## 2023-04-09 DIAGNOSIS — R109 Unspecified abdominal pain: Secondary | ICD-10-CM

## 2023-04-09 DIAGNOSIS — F3342 Major depressive disorder, recurrent, in full remission: Secondary | ICD-10-CM | POA: Diagnosis not present

## 2023-04-09 DIAGNOSIS — R6889 Other general symptoms and signs: Secondary | ICD-10-CM

## 2023-04-09 DIAGNOSIS — M810 Age-related osteoporosis without current pathological fracture: Secondary | ICD-10-CM | POA: Diagnosis not present

## 2023-04-09 DIAGNOSIS — J449 Chronic obstructive pulmonary disease, unspecified: Secondary | ICD-10-CM

## 2023-04-09 DIAGNOSIS — L989 Disorder of the skin and subcutaneous tissue, unspecified: Secondary | ICD-10-CM

## 2023-04-09 MED ORDER — HYOSCYAMINE SULFATE 0.125 MG PO TABS: 0.1250 mg | ORAL_TABLET | ORAL | 2 refills | Status: DC | PRN

## 2023-04-09 NOTE — Patient Instructions (Signed)
Hyoscyamine Tablets What is this medication? HYOSCYAMINE (hye oh SYE a meen) treats the symptoms of many conditions, such as irritable bowel syndrome (IBS), stomach ulcers, and bladder spasms. It works by relaxing the muscles in your digestive tract and bladder. It also reduces the amount of sweat, saliva, and stomach acid your body makes. It belongs to a group of medications called antispasmodics. This medicine may be used for other purposes; ask your health care provider or pharmacist if you have questions. COMMON BRAND NAME(S): Nicanor Alcon, HyoMax, Hyospaz, Levsin, Losamine, Medispaz, OSCIMIN, Spasdel What should I tell my care team before I take this medication? They need to know if you have any of these conditions: Difficulty passing urine Glaucoma Heart disease, or previous heart attack Myasthenia gravis Prostate trouble Stomach obstruction Ulcerative colitis An unusual or allergic reaction to hyoscyamine, other medications, foods, dyes, or preservatives Pregnant or trying to get pregnant Breast-feeding How should I use this medication? Take this medication by mouth with a glass of water. Follow the directions on the prescription label. Take your doses at regular intervals. Do not take your medication more often than directed. Talk to your care team about the use of this medication in children. Special care may be needed. While this medication may be prescribed for children as young as 12 years for selected conditions, precautions do apply. Overdosage: If you think you have taken too much of this medicine contact a poison control center or emergency room at once. NOTE: This medicine is only for you. Do not share this medicine with others. What if I miss a dose? If you miss a dose, take it as soon as you can. If it is almost time for your next dose, take only that dose. Do not take double or extra doses. What may interact with this  medication? Amantadine Antacids Benztropine Donepezil Galantamine Medications for depression Medications for hay fever and other allergies Medications for mental health conditions Rivastigmine Tacrine This list may not describe all possible interactions. Give your health care provider a list of all the medicines, herbs, non-prescription drugs, or dietary supplements you use. Also tell them if you smoke, drink alcohol, or use illegal drugs. Some items may interact with your medicine. What should I watch for while using this medication? You may get dizzy. Do not drive, use machinery, or do anything that needs mental alertness until you know how this medication affects you. To reduce the risk of dizzy or fainting spells, do not sit or stand up quickly, especially if you are an older patient. Alcohol can make you more dizzy. Avoid alcoholic drinks. Stay out of bright light and wear sunglasses if this medication makes your eyes more sensitive to light. Your mouth may get dry. Chewing sugarless gum or sucking hard candy, and drinking plenty of water may help. Contact your care team if the problem does not go away or is severe. This medication may cause dry eyes and blurred vision. If you wear contact lenses you may feel some discomfort. Lubricating drops may help. See your care team if the problem does not go away or is severe. Avoid extreme heat (e.g., hot tubs, saunas). This medication can cause you to sweat less than normal. Your body temperature could increase to dangerous levels, which may lead to heat stroke. What side effects may I notice from receiving this medication? Side effects that you should report to your care team as soon as possible: Allergic reactions--skin rash, itching, hives, swelling of the face, lips, tongue, or  throat Bowel blockage--stomach cramping, unable to have a bowel movement or pass gas, loss of appetite, vomiting Diarrhea Fever that does not go away, decreased  sweating Trouble passing urine Side effects that usually do not require medical attention (report to your care team if they continue or are bothersome): Blurry vision Confusion Constipation Dizziness Drowsiness Dry mouth Headache This list may not describe all possible side effects. Call your doctor for medical advice about side effects. You may report side effects to FDA at 1-800-FDA-1088. Where should I keep my medication? Keep out of the reach of children and pets. Store at room temperature between 15 and 30 degrees C (59 and 86 degrees F). Throw away any unused medication after the expiration date. NOTE: This sheet is a summary. It may not cover all possible information. If you have questions about this medicine, talk to your doctor, pharmacist, or health care provider.  2024 Elsevier/Gold Standard (2021-07-06 00:00:00)

## 2023-05-08 ENCOUNTER — Encounter: Payer: Self-pay | Admitting: Nurse Practitioner

## 2023-05-22 DIAGNOSIS — H526 Other disorders of refraction: Secondary | ICD-10-CM | POA: Diagnosis not present

## 2023-05-22 DIAGNOSIS — H25813 Combined forms of age-related cataract, bilateral: Secondary | ICD-10-CM | POA: Diagnosis not present

## 2023-05-22 DIAGNOSIS — H04123 Dry eye syndrome of bilateral lacrimal glands: Secondary | ICD-10-CM | POA: Diagnosis not present

## 2023-06-14 ENCOUNTER — Encounter: Payer: Self-pay | Admitting: Internal Medicine

## 2023-06-14 ENCOUNTER — Ambulatory Visit (INDEPENDENT_AMBULATORY_CARE_PROVIDER_SITE_OTHER): Payer: Medicare Other | Admitting: Internal Medicine

## 2023-06-14 VITALS — BP 120/70 | HR 65 | Temp 97.9°F | Resp 16 | Ht 60.0 in | Wt 115.0 lb

## 2023-06-14 DIAGNOSIS — I498 Other specified cardiac arrhythmias: Secondary | ICD-10-CM

## 2023-06-14 DIAGNOSIS — R7309 Other abnormal glucose: Secondary | ICD-10-CM

## 2023-06-14 DIAGNOSIS — K219 Gastro-esophageal reflux disease without esophagitis: Secondary | ICD-10-CM | POA: Diagnosis not present

## 2023-06-14 DIAGNOSIS — E039 Hypothyroidism, unspecified: Secondary | ICD-10-CM | POA: Diagnosis not present

## 2023-06-14 DIAGNOSIS — R0989 Other specified symptoms and signs involving the circulatory and respiratory systems: Secondary | ICD-10-CM

## 2023-06-14 DIAGNOSIS — E782 Mixed hyperlipidemia: Secondary | ICD-10-CM

## 2023-06-14 DIAGNOSIS — Z79899 Other long term (current) drug therapy: Secondary | ICD-10-CM | POA: Diagnosis not present

## 2023-06-14 DIAGNOSIS — E559 Vitamin D deficiency, unspecified: Secondary | ICD-10-CM

## 2023-06-14 NOTE — Patient Instructions (Signed)

## 2023-06-14 NOTE — Progress Notes (Signed)
Future Appointments  Date Time Provider Department  06/14/2023                       6 mo ov 10:30 AM Lucky Cowboy, MD  [facility]  09/18/2023                  9 mo     ov                             10:30 AM Adela Glimpse, NP  [facility]  09/24/2023  1:20 PM Pricilla Riffle, MD [facility]   12/18/2023                      cpe 11:00 AM Lucky Cowboy, MD  [facility]  04/10/2024                       wellness  3:00 PM Raynelle Dick, NP  [facility]    History of Present Illness:       This very nice 72 y.o. MWF presents for 6  month follow up with HTN, HLD, Hypothyroidism, Pre-Diabetes and Vitamin D Deficiency.  Patient has hx/o Depression on remission controlled on Lexapro.       Patient is followed expectantly for labile HTN (2000) & BP has been controlled at home. Today's BP is at goal - 120/70.  Negative stress Myoview in 2015.  Patient is also followed by Dr Dietrich Pates for Atrial bigeminy . Patient has had no complaints of any cardiac type chest pain, palpitations, dyspnea Pollyann Kennedy /PND, dizziness, claudication or dependent edema.        Hyperlipidemia is controlled with diet & Rosuvastatin.   Patient denies myalgias or other med SE's. Last Lipids were at goal:  Lab Results  Component Value Date   CHOL 174 04/09/2023   HDL 68 04/09/2023   LDLCALC 88 04/09/2023   TRIG 86 04/09/2023   CHOLHDL 2.6 04/09/2023     Also, the patient has been monitored for glucose intolerance and has had no symptoms of reactive hypoglycemia, diabetic polys, paresthesias or visual blurring.  Last A1c was normal & at goal:  Lab Results  Component Value Date   HGBA1C 5.6 11/30/2022                                                                                             Patient has been  on Thyroid Replacement since dx'd Hypothyroid in 2000.   Further, the patient also has history of Vitamin D Deficiency and supplements vitamin D without any suspected side-effects. Last vitamin D was at goal:  Lab  Results  Component Value Date   VD25OH 77 11/30/2022       Current Outpatient Medications  Medication Instructions   aspirin EC  81 mg Daily   Calcium Citrate (CITRACAL PO) 1 tablet  Daily   Cholecalciferol (VITAMIN D-3) 5000 units TABS 1 tablet Daily   escitalopram (LEXAPRO) 20 MG tablet Take 1 tablet  Daily for Mood  Flaxseed, Linseed, 1200 MG CAPS 1 capsule  Daily   hyoscyamine   0.125 mg  Every 4 hours PRN   levothyroxine 75 MCG tablet Take 1 tablet B Daily   Melatonin    5 mg  Daily at bedtime, PRN   metoprolol succinate-XL 25 MG  TAKE 1/2 TABLET DAILY   PROBIOTIC-10 1 capsule Daily   rosuvastatin  20 MG tablet Take  1 tablet  3 x /week  for Cholesterol   vitamin C    1,000 mg Daily     Allergies  Allergen Reactions   Ciprofloxacin Other (See Comments)    Alleged tendonitis   Z-Pak [Azithromycin] Other (See Comments)    Patient reports that the reaction was GI upset issues.     PMHx:   Past Medical History:  Diagnosis Date   Anxiety    Diverticular disease    GERD (gastroesophageal reflux disease)    Hx of diverticulitis of colon 04/04/2017   Hyperlipidemia, mixed    Hypothyroidism 2000   Vitamin D deficiency      Immunization History  Administered Date(s) Administered   Influenza Inj Mdck Quad  07/27/2017   Influenza, High Dose  07/16/2018   PFIZER-SARS-COV-2 Vacc 10/04/2019, 10/30/2019, 06/28/2020   PPD Test 04/05/2017   Pneumococcal - 13 06/06/2018   Td 12/29/2020   Tdap 08/28/2010     Past Surgical History:  Procedure Laterality Date   BREAST BIOPSY Left    benign   CHOLECYSTECTOMY N/A 02/08/2017   Procedure: LAPAROSCOPIC CHOLECYSTECTOMY WITH INTRAOPERATIVE CHOLANGIOGRAM;  Surgeon: Manus Rudd, MD;  Location: [facility];  Service: General;  Laterality: N/A;   EYE SURGERY     Eyelid lifted   TONSILLECTOMY      FHx:    Reviewed / unchanged  SHx:    Reviewed / unchanged   Systems Review:  Constitutional: Denies fever, chills, wt changes,  headaches, insomnia, fatigue, night sweats, change in appetite. Eyes: Denies redness, blurred vision, diplopia, discharge, itchy, watery eyes.  ENT: Denies discharge, congestion, post nasal drip, epistaxis, sore throat, earache, hearing loss, dental pain, tinnitus, vertigo, sinus pain, snoring.  CV: Denies chest pain, palpitations, irregular heartbeat, syncope, dyspnea, diaphoresis, orthopnea, PND, claudication or edema. Respiratory: denies cough, dyspnea, DOE, pleurisy, hoarseness, laryngitis, wheezing.  Gastrointestinal: Denies dysphagia, odynophagia, heartburn, reflux, water brash, abdominal pain or cramps, nausea, vomiting, bloating, diarrhea, constipation, hematemesis, melena, hematochezia  or hemorrhoids. Genitourinary: Denies dysuria, frequency, urgency, nocturia, hesitancy, discharge, hematuria or flank pain. Musculoskeletal: Denies arthralgias, myalgias, stiffness, jt. swelling, pain, limping or strain/sprain.  Skin: Denies pruritus, rash, hives, warts, acne, eczema or change in skin lesion(s). Neuro: No weakness, tremor, incoordination, spasms, paresthesia or pain. Psychiatric: Denies confusion, memory loss or sensory loss. Endo: Denies change in weight, skin or hair change.  Heme/Lymph: No excessive bleeding, bruising or enlarged lymph nodes.  Physical Exam  BP 120/70   Pulse 65   Temp 97.9 F (36.6 C)   Resp 16   Ht 5' (1.524 m)   Wt 115 lb (52.2 kg)   SpO2 97%   BMI 22.46 kg/m   Appears  well nourished, well groomed  and in no distress.  Eyes: PERRLA, EOMs, conjunctiva no swelling or erythema. Sinuses: No frontal/maxillary tenderness ENT/Mouth: EAC's clear, TM's nl w/o erythema, bulging. Nares clear w/o erythema, swelling, exudates. Oropharynx clear without erythema or exudates. Oral hygiene is good. Tongue normal, non obstructing. Hearing intact.  Neck: Supple. Thyroid not palpable. Car 2+/2+ without bruits, nodes or JVD. Chest: Respirations nl  with BS clear & equal w/o  rales, rhonchi, wheezing or stridor.  Cor: Heart sounds normal w/ regular rate and rhythm without sig. murmurs, gallops, clicks or rubs. Peripheral pulses normal and equal  without edema.  Abdomen: Soft & bowel sounds normal. Non-tender w/o guarding, rebound, hernias, masses or organomegaly.  Lymphatics: Unremarkable.  Musculoskeletal: Full ROM all peripheral extremities, joint stability, 5/5 strength and normal gait.  Skin: Warm, dry without exposed rashes, lesions or ecchymosis apparent.  Neuro: Cranial nerves intact, reflexes equal bilaterally. Sensory-motor testing grossly intact. Tendon reflexes grossly intact.  Pysch: Alert & oriented x 3.  Insight and judgement nl & appropriate. No ideations.  Assessment and Plan:   1. Labile hypertension  - CBC with Differential/Platelet - COMPLETE METABOLIC PANEL WITH GFR - Magnesium - TSH   2. Hyperlipidemia, mixed  - Lipid panel - TSH   3. Abnormal glucose  - Hemoglobin A1c - Insulin, random   4. Vitamin D deficiency  - VITAMIN D 25 Hydroxy    5. Hypothyroidism  - TSH   6. Gastroesophageal reflux disease  - CBC with Differential/Platelet   7. Atrial arrhythmia  - TSH   8. Medication management  - CBC with Differential/Platelet - COMPLETE METABOLIC PANEL WITH GFR - Magnesium - Lipid panel - TSH - Hemoglobin A1c - Insulin, random - VITAMIN D 25 Hydroxy           Discussed  regular exercise, BP monitoring, weight control to achieve/maintain BMI less than 25 and discussed med and SE's. Recommended labs to assess and monitor clinical status with further disposition pending results of labs.  I discussed the assessment and treatment plan with the patient. The patient was provided an opportunity to ask questions and all were answered. The patient agreed with the plan and demonstrated an understanding of the instructions.  I provided over 30 minutes of exam, counseling, chart review and  complex critical decision  making.        The patient was advised to call back or seek an in-person evaluation if the symptoms worsen or if the condition fails to improve as anticipated.   Marinus Maw, MD

## 2023-06-15 LAB — CBC WITH DIFFERENTIAL/PLATELET
Absolute Lymphocytes: 1699 {cells}/uL (ref 850–3900)
Absolute Monocytes: 530 {cells}/uL (ref 200–950)
Basophils Absolute: 68 {cells}/uL (ref 0–200)
Basophils Relative: 1.2 %
Eosinophils Absolute: 80 {cells}/uL (ref 15–500)
Eosinophils Relative: 1.4 %
HCT: 41.6 % (ref 35.0–45.0)
Hemoglobin: 13.8 g/dL (ref 11.7–15.5)
MCH: 32.8 pg (ref 27.0–33.0)
MCHC: 33.2 g/dL (ref 32.0–36.0)
MCV: 98.8 fL (ref 80.0–100.0)
MPV: 10 fL (ref 7.5–12.5)
Monocytes Relative: 9.3 %
Neutro Abs: 3323 {cells}/uL (ref 1500–7800)
Neutrophils Relative %: 58.3 %
Platelets: 280 10*3/uL (ref 140–400)
RBC: 4.21 10*6/uL (ref 3.80–5.10)
RDW: 11.5 % (ref 11.0–15.0)
Total Lymphocyte: 29.8 %
WBC: 5.7 10*3/uL (ref 3.8–10.8)

## 2023-06-15 LAB — COMPLETE METABOLIC PANEL WITH GFR
AG Ratio: 1.8 (calc) (ref 1.0–2.5)
ALT: 25 U/L (ref 6–29)
AST: 27 U/L (ref 10–35)
Albumin: 4.6 g/dL (ref 3.6–5.1)
Alkaline phosphatase (APISO): 65 U/L (ref 37–153)
BUN: 18 mg/dL (ref 7–25)
CO2: 32 mmol/L (ref 20–32)
Calcium: 9.4 mg/dL (ref 8.6–10.4)
Chloride: 101 mmol/L (ref 98–110)
Creat: 0.69 mg/dL (ref 0.60–1.00)
Globulin: 2.5 g/dL (ref 1.9–3.7)
Glucose, Bld: 81 mg/dL (ref 65–99)
Potassium: 5.5 mmol/L — ABNORMAL HIGH (ref 3.5–5.3)
Sodium: 138 mmol/L (ref 135–146)
Total Bilirubin: 0.7 mg/dL (ref 0.2–1.2)
Total Protein: 7.1 g/dL (ref 6.1–8.1)
eGFR: 93 mL/min/{1.73_m2} (ref >=60)

## 2023-06-15 LAB — LIPID PANEL
Cholesterol: 202 mg/dL — ABNORMAL HIGH (ref <200)
HDL: 77 mg/dL (ref >=50)
LDL Cholesterol (Calc): 107 mg/dL — ABNORMAL HIGH
Non-HDL Cholesterol (Calc): 125 mg/dL (ref <130)
Total CHOL/HDL Ratio: 2.6 (calc) (ref <5.0)
Triglycerides: 90 mg/dL (ref <150)

## 2023-06-15 LAB — VITAMIN D 25 HYDROXY (VIT D DEFICIENCY, FRACTURES): Vit D, 25-Hydroxy: 88 ng/mL (ref 30–100)

## 2023-06-15 LAB — HEMOGLOBIN A1C
Hgb A1c MFr Bld: 5.5 %{Hb} (ref <5.7)
Mean Plasma Glucose: 111 mg/dL
eAG (mmol/L): 6.2 mmol/L

## 2023-06-15 LAB — TSH: TSH: 0.95 m[IU]/L (ref 0.40–4.50)

## 2023-06-15 LAB — MAGNESIUM: Magnesium: 2.2 mg/dL (ref 1.5–2.5)

## 2023-06-15 LAB — INSULIN, RANDOM: Insulin: 3 u[IU]/mL

## 2023-06-16 NOTE — Progress Notes (Signed)
<>*<>*<>*<>*<>*<>*<>*<>*<>*<>*<>*<>*<>*<>*<>*<>*<>*<>*<>*<>*<>*<>*<>*<>*<> <>*<>*<>*<>*<>*<>*<>*<>*<>*<>*<>*<>*<>*<>*<>*<>*<>*<>*<>*<>*<>*<>*<>*<>*<>  -  Test results slightly outside the reference range are not unusual. If there is anything important, I will review this with you,  otherwise it is considered normal test values.  If you have further questions,  please do not hesitate to contact me at the office or via My Chart.   <>*<>*<>*<>*<>*<>*<>*<>*<>*<>*<>*<>*<>*<>*<>*<>*<>*<>*<>*<>*<>*<>*<>*<>*<> <>*<>*<>*<>*<>*<>*<>*<>*<>*<>*<>*<>*<>*<>*<>*<>*<>*<>*<>*<>*<>*<>*<>*<>*<>  -  Total  Chol =  202    - Elevated             (  Ideal  or  Goal is less than 180  !  )  & -  Bad / Dangerous LDL  Chol =  107  - also Elevated              (  Ideal  or  Goal is less than 70  !  )   - Please be sure taking your Rosuvastatin  (Crestor) and also   - Recommend a STRICTER  low cholesterol diet   - Cholesterol only comes from animal sources                                                                     - ie. meat, dairy, egg yolks  - Eat all the vegetables you want.  - Avoid Meat, Avoid Meat,  Avoid Meat                                                        - especially Red Meat - Beef AND Pork .  - Avoid cheese & dairy - milk & ice cream.     - Cheese is the most concentrated form of trans-fats which                                                          is the worst thing to clog up our arteries.    - Veggie cheese is OK which can be found in the fresh                        produce section at [employer] or [employer] or [employer]  <>*<>*<>*<>*<>*<>*<>*<>*<>*<>*<>*<>*<>*<>*<>*<>*<>*<>*<>*<>*<>*<>*<>*<>*<> <>*<>*<>*<>*<>*<>*<>*<>*<>*<>*<>*<>*<>*<>*<>*<>*<>*<>*<>*<>*<>*<>*<>*<>*<>  -  A1c - Normal  - No Diabetes  - Great   !  <>*<>*<>*<>*<>*<>*<>*<>*<>*<>*<>*<>*<>*<>*<>*<>*<>*<>*<>*<>*<>*<>*<>*<>*<> <>*<>*<>*<>*<>*<>*<>*<>*<>*<>*<>*<>*<>*<>*<>*<>*<>*<>*<>*<>*<>*<>*<>*<>*<>  -  Vitamin D = 88 - Excellent  - Please keep dosage  same   <>*<>*<>*<>*<>*<>*<>*<>*<>*<>*<>*<>*<>*<>*<>*<>*<>*<>*<>*<>*<>*<>*<>*<>*<> <>*<>*<>*<>*<>*<>*<>*<>*<>*<>*<>*<>*<>*<>*<>*<>*<>*<>*<>*<>*<>*<>*<>*<>*<>  -  All Else - CBC - Kidneys - Electrolytes - Liver - Magnesium & Thyroid    - all  Normal / OK  <>*<>*<>*<>*<>*<>*<>*<>*<>*<>*<>*<>*<>*<>*<>*<>*<>*<>*<>*<>*<>*<>*<>*<>*<> <>*<>*<>*<>*<>*<>*<>*<>*<>*<>*<>*<>*<>*<>*<>*<>*<>*<>*<>*<>*<>*<>*<>*<>*<>

## 2023-06-22 ENCOUNTER — Other Ambulatory Visit: Payer: Self-pay | Admitting: Nurse Practitioner

## 2023-08-15 DIAGNOSIS — Z6822 Body mass index (BMI) 22.0-22.9, adult: Secondary | ICD-10-CM | POA: Diagnosis not present

## 2023-08-15 DIAGNOSIS — Z124 Encounter for screening for malignant neoplasm of cervix: Secondary | ICD-10-CM | POA: Diagnosis not present

## 2023-08-15 DIAGNOSIS — Z1151 Encounter for screening for human papillomavirus (HPV): Secondary | ICD-10-CM | POA: Diagnosis not present

## 2023-08-15 DIAGNOSIS — Z1231 Encounter for screening mammogram for malignant neoplasm of breast: Secondary | ICD-10-CM | POA: Diagnosis not present

## 2023-08-15 LAB — HM MAMMOGRAPHY

## 2023-08-16 LAB — HM PAP SMEAR: HPV, high-risk: NEGATIVE

## 2023-09-04 ENCOUNTER — Ambulatory Visit (INDEPENDENT_AMBULATORY_CARE_PROVIDER_SITE_OTHER): Payer: Medicare Other

## 2023-09-04 VITALS — Temp 98.0°F

## 2023-09-04 DIAGNOSIS — Z23 Encounter for immunization: Secondary | ICD-10-CM | POA: Diagnosis not present

## 2023-09-18 ENCOUNTER — Ambulatory Visit: Payer: Medicare Other | Admitting: Nurse Practitioner

## 2023-09-18 DIAGNOSIS — Z6822 Body mass index (BMI) 22.0-22.9, adult: Secondary | ICD-10-CM | POA: Diagnosis not present

## 2023-09-18 DIAGNOSIS — H6123 Impacted cerumen, bilateral: Secondary | ICD-10-CM | POA: Diagnosis not present

## 2023-09-21 NOTE — Progress Notes (Unsigned)
Cardiology Office Note   Date:  09/24/2023   ID:  Isabel Tyler, DOB Feb 11, 1951, MRN [id number]  PCP:  Lucky Cowboy, MD  Cardiologist:   Dietrich Pates, MD   Patient presents for follow-up of palpitations.   History of Present Illness: Isabel Tyler is a 73 y.o. female with a history of HTN, HL, hypothyroidism,  Myovue x 2  Last in 2015 Normal  Followed by Rosalita Levan.  Seen in clinic on 09/10/20  No CP  No dizziness  No palpitations    EKG was done that reportedly showed atrial fibrillation The pt was placed on Xarelto and referred to cardiology    I saw the pt for the first time in Jan 2022.  EKG showed sinus rhythm with PACs (bigeminy) and also sinus rhythm.  I did not think there was atrial fibrillation.  She went on to have a monitor that showed sinus rhythm a couple short burst of SVT that lasted about a minute each.  Again no atrial fibrillation.  Rare PVC/PAC.  I recommended a low-dose of a beta-blocker.  I saw the pt in Nov 2022  Since seen the pt denies palpitations    Breathing is OK  She denies CP  She says she just doesn't have energy   Wakes up fatigued    She denies snoring   Does not think she has sleep apnea  Mother had CVA  Father had angina  Died at 6    I saw the pt in clinic in 2023   In Jan 2024 CT calcium score showed Ca score 0   Dense mitral annular calcifications   Since seen she notes rare brief skip in heart beat   nothing prolonged    Breathing is good   DOing Silver Sneakers 3x per week     Current Meds  Medication Sig   Ascorbic Acid (VITAMIN C) 1000 MG tablet Take 1,000 mg by mouth daily.   aspirin EC 81 MG tablet Take 81 mg by mouth daily.   Calcium Citrate (CITRACAL PO) Take 1 tablet by mouth daily.   Cholecalciferol (VITAMIN D-3) 5000 units TABS Take 1 tablet by mouth daily.   escitalopram (LEXAPRO) 20 MG tablet Take 1 tablet by mouth Daily for Mood   Flaxseed, Linseed, (FLAXSEED OIL) 1200 MG CAPS Take 1 capsule by mouth daily.    levothyroxine (SYNTHROID) 75 MCG tablet Take 1 tablet BY MOUTH Daily on an Empty Stomach with only water for 30 minutes & No Antacid meds, Calcium or Magnesium for 4 hours and AVOID Biotin   Melatonin 5 MG/ML LIQD Take 5 mg by mouth at bedtime. PRN   metoprolol succinate (TOPROL-XL) 25 MG 24 hr tablet TAKE 1/2 TABLET(12.5 MG) BY MOUTH DAILY   Probiotic Product (PROBIOTIC-10) CAPS Take 1 capsule by mouth daily.   rosuvastatin (CRESTOR) 20 MG tablet Take  1 tablet  3 x  /week  for Cholesterol   tobramycin-dexamethasone (TOBRADEX) ophthalmic solution Place 1 drop into both eyes as needed.     Allergies:   Ciprofloxacin and Z-pak [azithromycin]   Past Medical History:  Diagnosis Date   Anxiety    Diverticular disease    GERD (gastroesophageal reflux disease)    Hx of diverticulitis of colon 04/04/2017   Hyperlipidemia, mixed    Hypothyroidism 2000   Vitamin D deficiency     Past Surgical History:  Procedure Laterality Date   BREAST BIOPSY Left    benign   CHOLECYSTECTOMY N/A 02/08/2017  Procedure: LAPAROSCOPIC CHOLECYSTECTOMY WITH INTRAOPERATIVE CHOLANGIOGRAM;  Surgeon: Manus Rudd, MD;  Location: [facility];  Service: General;  Laterality: N/A;   EYE SURGERY     Eyelid lifted   TONSILLECTOMY       Social History:  The patient  reports that she has never smoked. She has never used smokeless tobacco. She reports current alcohol use. She reports that she does not use drugs.   Family History:  The patient's family history includes Breast cancer in her sister; Heart disease in her father and mother.    ROS:  Please see the history of present illness. All other systems are reviewed and  Negative to the above problem except as noted.    PHYSICAL EXAM: VS:  BP (!) 100/58   Pulse 65   Ht 5' (1.524 m)   Wt 118 lb 3.2 oz (53.6 kg)   SpO2 96%   BMI 23.08 kg/m   GEN: Well nourished, well developed, in no acute distress  HEENT: normal  Neck: no JVD, no carotid bruits Cardiac:  RRR   Gr I/VI systolic murmur apex   No LE  edema  Respiratory:  clear to auscultation bilaterally,  GI: soft, nontender, nondistended   No hepatomegaly   EKG:  EKG shows SR 65 bpm   Septal MI  Nonspecific ST changes   Lipid Panel    Component Value Date/Time   CHOL 202 (H) 06/14/2023 1145   TRIG 90 06/14/2023 1145   HDL 77 06/14/2023 1145   CHOLHDL 2.6 06/14/2023 1145   VLDL 13 04/05/2017 1046   LDLCALC 107 (H) 06/14/2023 1145      Wt Readings from Last 3 Encounters:  09/24/23 118 lb 3.2 oz (53.6 kg)  06/14/23 115 lb (52.2 kg)  04/09/23 115 lb 3.2 oz (52.3 kg)      ASSESSMENT AND PLAN:  1  Rhythm   No epsiodes of signifcant arrhythmias   I have never seen afib  Follow  PACs  Keep on toprol  2  dyslipidemia   Last lipid panel in Oct 2024 LDL 107   Has been better   Keep on Crestor   Lots of veggies       Current medicines are reviewed at length with the patient today.  The patient does not have concerns regarding medicines.  Signed, Dietrich Pates, MD  09/24/2023 1:40 PM    [facility] [address] [place], [place], [place]  [address] Phone: [phone]; Fax: [phone]

## 2023-09-24 ENCOUNTER — Ambulatory Visit: Payer: Medicare Other | Attending: Internal Medicine | Admitting: Internal Medicine

## 2023-09-24 ENCOUNTER — Encounter: Payer: Self-pay | Admitting: Internal Medicine

## 2023-09-24 VITALS — BP 100/58 | HR 65 | Ht 60.0 in | Wt 118.2 lb

## 2023-09-24 DIAGNOSIS — R002 Palpitations: Secondary | ICD-10-CM | POA: Insufficient documentation

## 2023-09-24 NOTE — Patient Instructions (Signed)
Medication Instructions:    *If you need a refill on your cardiac medications before your next appointment, please call your pharmacy*   Lab Work:   If you have labs (blood work) drawn today and your tests are completely normal, you will receive your results only by: MyChart Message (if you have MyChart) OR A paper copy in the mail If you have any lab test that is abnormal or we need to change your treatment, we will call you to review the results.   Testing/Procedures:     Follow-Up: 12-18 months  At [facility], you and your health needs are our priority.  As part of our continuing mission to provide you with exceptional heart care, we have created designated Provider Care Teams.  These Care Teams include your primary Cardiologist (physician) and Advanced Practice Providers (APPs -  Physician Assistants and Nurse Practitioners) who all work together to provide you with the care you need, when you need it.  We recommend signing up for the patient portal called "MyChart".  Sign up information is provided on this After Visit Summary.  MyChart is used to connect with patients for Virtual Visits (Telemedicine).  Patients are able to view lab/test results, encounter notes, upcoming appointments, etc.  Non-urgent messages can be sent to your provider as well.   To learn more about what you can do with MyChart, go to [email or web address].    Your next appointment:

## 2023-09-25 ENCOUNTER — Ambulatory Visit (INDEPENDENT_AMBULATORY_CARE_PROVIDER_SITE_OTHER): Payer: Medicare Other | Admitting: Nurse Practitioner

## 2023-09-25 VITALS — BP 110/74 | HR 72 | Temp 98.0°F | Resp 16 | Ht 60.0 in | Wt 115.0 lb

## 2023-09-25 DIAGNOSIS — K219 Gastro-esophageal reflux disease without esophagitis: Secondary | ICD-10-CM

## 2023-09-25 DIAGNOSIS — K5730 Diverticulosis of large intestine without perforation or abscess without bleeding: Secondary | ICD-10-CM | POA: Diagnosis not present

## 2023-09-25 DIAGNOSIS — D1803 Hemangioma of intra-abdominal structures: Secondary | ICD-10-CM | POA: Diagnosis not present

## 2023-09-25 DIAGNOSIS — E039 Hypothyroidism, unspecified: Secondary | ICD-10-CM | POA: Diagnosis not present

## 2023-09-25 DIAGNOSIS — J449 Chronic obstructive pulmonary disease, unspecified: Secondary | ICD-10-CM

## 2023-09-25 DIAGNOSIS — R0989 Other specified symptoms and signs involving the circulatory and respiratory systems: Secondary | ICD-10-CM

## 2023-09-25 DIAGNOSIS — R002 Palpitations: Secondary | ICD-10-CM

## 2023-09-25 DIAGNOSIS — E782 Mixed hyperlipidemia: Secondary | ICD-10-CM

## 2023-09-25 DIAGNOSIS — R7309 Other abnormal glucose: Secondary | ICD-10-CM | POA: Diagnosis not present

## 2023-09-25 DIAGNOSIS — E559 Vitamin D deficiency, unspecified: Secondary | ICD-10-CM

## 2023-09-25 DIAGNOSIS — F3342 Major depressive disorder, recurrent, in full remission: Secondary | ICD-10-CM

## 2023-09-25 DIAGNOSIS — Z79899 Other long term (current) drug therapy: Secondary | ICD-10-CM | POA: Diagnosis not present

## 2023-09-25 NOTE — Progress Notes (Signed)
MEDICARE ANNUAL WELLNESS VISIT AND 3 MONTH FOLLOW UP  Assessment:   Isabel Tyler was seen today for medicare wellness.  Diagnoses and all orders for this visit:   Palpitations Managed Continue Toprolol 25 mg 1/2 tab dailyl via Follows with Cardiology Dr. Tenny Craw Continue to monitor  Hypothyroidism, unspecified type Controlled. Continue Levothyroxine. Reminded to take on an empty stomach 30-80mins before food.  Stop any Biotin Supplement 48-72 hours before next TSH level to reduce the risk of falsely low TSH levels. Continue to monitor.     Diverticulosis of colon without hemorrhage Doing well on current regimen Continue probiotic  Gastroesophageal reflux disease, esophagitis presence not specified No suspected reflux complications (Barret/stricture). Lifestyle modification:  wt loss, avoid meals 2-3h before bedtime. Consider eliminating food triggers:  chocolate, caffeine, EtOH, acid/spicy food.   Hyperlipidemia, mixed Discussed lifestyle modifications. Recommended diet heavy in fruits and veggies, omega 3's. Decrease consumption of animal meats, cheeses, and dairy products. Remain active and exercise as tolerated. Continue to monitor. Check lipids/TSH   COPD (HCC) Per CXR 2018; never smoker, asymptomatic, not on inhalers, monitor   Depression, recurrent in remission (HCC) anxiety Doing well on lexapro hasn't tolerated taper Continue with benefit PQ2, negative  Vitamin D deficiency Continue supplementation  Abnormal glucose Education: Reviewed 'ABCs' of diabetes management  Discussed goals to be met and/or maintained include A1C (<7) Blood pressure (<130/80) Cholesterol (LDL <70) Continue Eye Exam yearly  Continue Dental Exam Q6 mo Discussed dietary recommendations Discussed Physical Activity recommendations Check A1C  Hepatic Hemangioma  Monitor, follow up with GI    Orders Placed This Encounter  Procedures   CBC with Differential/Platelet   COMPLETE  METABOLIC PANEL WITH GFR   Lipid panel   Hemoglobin A1c   TSH    Notify office for further evaluation and treatment, questions or concerns if any reported s/s fail to improve.   The patient was advised to call back or seek an in-person evaluation if any symptoms worsen or if the condition fails to improve as anticipated.   Further disposition pending results of labs. Discussed med's effects and SE's.    I discussed the assessment and treatment plan with the patient. The patient was provided an opportunity to ask questions and all were answered. The patient agreed with the plan and demonstrated an understanding of the instructions.  Discussed med's effects and SE's. Screening labs and tests as requested with regular follow-up as recommended.  I provided 30 minutes of face-to-face time during this encounter including counseling, chart review, and critical decision making was preformed.  Today's Plan of Care is based on a patient-centered health care approach known as shared decision making - the decisions, tests and treatments allow for patient preferences and values to be balanced with clinical evidence.    Future Appointments  Date Time Provider Department Center  12/18/2023 11:00 AM Lucky Cowboy, MD [facility] None  04/10/2024  3:00 PM Raynelle Dick, NP [facility] None     Plan:   During the course of the visit the patient was educated and counseled about appropriate screening and preventive services including:   Pneumococcal vaccine  Prevnar 13 Influenza vaccine Td vaccine Screening electrocardiogram Bone densitometry screening Colorectal cancer screening Diabetes screening Glaucoma screening Nutrition counseling  Advanced directives: requested   Subjective:  Isabel Tyler is a 73 y.o. female who presents for a follow up. She has Hyperlipidemia, mixed; Hypothyroidism; Major depressive disorder, recurrent, in full remission (HCC); Diverticulosis of colon  without hemorrhage; Hepatic hemangioma, Rt lobe (  7 cm) Jan 2017; Gastroesophageal reflux disease; Vitamin D deficiency; Abnormal glucose; Palpitations; Lumbar spinal stenosis; Trigger thumb, left thumb; Rosacea; Osteoporosis; and COPD (chronic obstructive pulmonary disease) (HCC) on their problem list.  Overall she reports feeling well today.  She has no new concerns at this time  Watches her grandkids 1/2 day 4 days a week.   She does have issues with IBS- has a lot of cramping and more frequent BM's  She has a diagnosis of recurrent major depression/anxiety and is currently on lexapro 20 mg daily, reports symptoms are well controlled on current regimen. She has tried to taper off several times but hasn't done well.   She has noticed a small ecchymotic area on left forearm/wrist - approx 1-2 cm, mildly tender if touched.   Hemangioma, 7 cm of R lobe, noted 08/2015 on imaging , had been declining follow up. Is est with GI Dr. Rhea Belton.   She has been having epigastric sx; EKG in office showed possible a. Fib, cardiology Dr. Tenny Craw felt more atrial bigeminy, had holter 09/2020 showing NSR with PACs, rare brief SVT, had normal ECHO other than mild MR. She was initiated on toprol 12.5 mg daily and doing well.   Osteoporosis followed by GYN , currently on Fosamax vacation.   BMI is Body mass index is 22.46 kg/m., she has been working on diet and exercise, doing silver sneakers 1 hour 3 days a week.  Wt Readings from Last 3 Encounters:  09/25/23 115 lb (52.2 kg)  09/24/23 118 lb 3.2 oz (53.6 kg)  06/14/23 115 lb (52.2 kg)    Her blood pressure has been controlled at home, today their BP is BP: 110/74 . Palpitations are controlled on Metoprolol 25 mg 1/2 tab daily  BP Readings from Last 3 Encounters:  09/25/23 110/74  09/24/23 (!) 100/58  06/14/23 120/70  She does workout. She denies chest pain, shortness of breath, dizziness.    She is on cholesterol medication, Rosuvastatin 20 mg three days a  week and denies myalgias. Her cholesterol is at goal. The cholesterol last visit was:   Lab Results  Component Value Date   CHOL 202 (H) 06/14/2023   HDL 77 06/14/2023   LDLCALC 107 (H) 06/14/2023   TRIG 90 06/14/2023   CHOLHDL 2.6 06/14/2023    Last A1C in the office was:  Lab Results  Component Value Date   HGBA1C 5.5 06/14/2023   Last GFR: Lab Results  Component Value Date   EGFR 93 06/14/2023   She is on thyroid medication. Her medication was not changed last visit.   Lab Results  Component Value Date   TSH 0.95 06/14/2023  . Patient is on Vitamin D supplement.   Lab Results  Component Value Date   VD25OH 88 06/14/2023      Medication Review: Current Outpatient Medications on File Prior to Visit  Medication Sig Dispense Refill   Ascorbic Acid (VITAMIN C) 1000 MG tablet Take 1,000 mg by mouth daily.     aspirin EC 81 MG tablet Take 81 mg by mouth daily.     Calcium Citrate (CITRACAL PO) Take 1 tablet by mouth daily.     Cholecalciferol (VITAMIN D-3) 5000 units TABS Take 1 tablet by mouth daily.     escitalopram (LEXAPRO) 20 MG tablet Take 1 tablet by mouth Daily for Mood 90 tablet 3   Flaxseed, Linseed, (FLAXSEED OIL) 1200 MG CAPS Take 1 capsule by mouth daily.     levothyroxine (SYNTHROID) 75 MCG  tablet Take 1 tablet BY MOUTH Daily on an Empty Stomach with only water for 30 minutes & No Antacid meds, Calcium or Magnesium for 4 hours and AVOID Biotin 90 tablet 3   Melatonin 5 MG/ML LIQD Take 5 mg by mouth at bedtime. PRN     metoprolol succinate (TOPROL-XL) 25 MG 24 hr tablet TAKE 1/2 TABLET(12.5 MG) BY MOUTH DAILY 45 tablet 2   Probiotic Product (PROBIOTIC-10) CAPS Take 1 capsule by mouth daily.     rosuvastatin (CRESTOR) 20 MG tablet Take  1 tablet  3 x  /week  for Cholesterol 39 tablet 3   tobramycin-dexamethasone (TOBRADEX) ophthalmic solution Place 1 drop into both eyes as needed.     No current facility-administered medications on file prior to visit.     Allergies  Allergen Reactions   Ciprofloxacin Other (See Comments)    Alleged tendonitis   Z-Pak [Azithromycin] Other (See Comments)    Patient reports that the reaction was GI upset issues.    Current Problems (verified) Patient Active Problem List   Diagnosis Date Noted   COPD (chronic obstructive pulmonary disease) (HCC) 11/11/2019   Osteoporosis 10/07/2019   Rosacea 12/12/2018   Trigger thumb, left thumb 09/26/2018   Lumbar spinal stenosis 09/03/2018   Vitamin D deficiency 06/06/2018   Abnormal glucose 06/06/2018   Palpitations 06/06/2018   Gastroesophageal reflux disease 04/04/2017   Hepatic hemangioma, Rt lobe (7 cm) Jan 2017 12/12/2016   Hyperlipidemia, mixed 10/23/2016   Hypothyroidism 10/23/2016   Major depressive disorder, recurrent, in full remission (HCC) 10/23/2016   Diverticulosis of colon without hemorrhage 10/23/2016    Screening Tests Immunization History  Administered Date(s) Administered   Influenza Inj Mdck Quad With Preservative 07/27/2017   Influenza, High Dose Seasonal PF 07/16/2018, 09/04/2023   Influenza,inj,Quad PF,6+ Mos 07/26/2021, 08/31/2022   PFIZER(Purple Top)SARS-COV-2 Vaccination 10/04/2019, 10/30/2019, 06/28/2020   PPD Test 04/05/2017   Pneumococcal Conjugate-13 06/06/2018   Pneumococcal Polysaccharide-23 01/17/2022   Td 12/29/2020   Tdap 08/28/2010   Health Maintenance  Topic Date Due   COVID-19 Vaccine (4 - 2024-25 season) 04/29/2023   MAMMOGRAM  07/28/2023   Medicare Annual Wellness (AWV)  04/08/2024   DTaP/Tdap/Td (3 - Td or Tdap) 12/30/2030   Colonoscopy  01/18/2031   Pneumonia Vaccine 35+ Years old  Completed   INFLUENZA VACCINE  Completed   DEXA SCAN  Completed   Hepatitis C Screening  Completed   HPV VACCINES  Aged Out   Zoster Vaccines- Shingrix  Discontinued   Patient Care Team: Lucky Cowboy, MD as PCP - General (Internal Medicine) Pricilla Riffle, MD as PCP - Cardiology (Cardiology)  SURGICAL HISTORY She   has a past surgical history that includes Breast biopsy (Left); Tonsillectomy; Eye surgery; and Cholecystectomy (N/A, 02/08/2017). FAMILY HISTORY Her family history includes Breast cancer in her sister; Heart disease in her father and mother. SOCIAL HISTORY She  reports that she has never smoked. She has never used smokeless tobacco. She reports current alcohol use. She reports that she does not use drugs.  Review of Systems  Constitutional:  Negative for malaise/fatigue and weight loss.  HENT:  Positive for hearing loss (wears bilateral hearing aids). Negative for tinnitus.        Hard of hearing- has hearing aids bilaterally  Eyes:  Negative for blurred vision and double vision.  Respiratory:  Negative for cough, sputum production, shortness of breath and wheezing.   Cardiovascular:  Negative for chest pain, palpitations, orthopnea, claudication, leg swelling and PND.  Gastrointestinal:  Positive for abdominal pain (cramps). Negative for blood in stool, constipation, diarrhea, heartburn, melena, nausea and vomiting.  Genitourinary: Negative.   Musculoskeletal:  Negative for falls, joint pain and myalgias.  Skin:  Negative for rash.       Noticed purple discoloration left forearm today  Neurological:  Negative for dizziness, tingling, sensory change, weakness and headaches.  Endo/Heme/Allergies:  Negative for polydipsia.  Psychiatric/Behavioral: Negative.  Negative for depression, memory loss, substance abuse and suicidal ideas. The patient is not nervous/anxious and does not have insomnia.   All other systems reviewed and are negative.    Objective:     Today's Vitals   09/25/23 1358  BP: 110/74  Pulse: 72  Resp: 16  Temp: 98 F (36.7 C)  SpO2: 96%  Weight: 115 lb (52.2 kg)  Height: 5' (1.524 m)    Body mass index is 22.46 kg/m.  General appearance: Very pleasant female in no apparent distress HEENT: normocephalic, sclerae anicteric, TMs pearly, nares patent, no discharge  or erythema, pharynx normal. Bilateral hearing aids Oral cavity: MMM, no lesions Neck: supple, no lymphadenopathy, no thyromegaly, no masses Heart: RRR, normal S1, S2, no murmurs Lungs: CTA bilaterally, no wheezes, rhonchi, or rales Abdomen: +bs, soft, non tender, non distended, no masses, no hepatomegaly, no splenomegaly Musculoskeletal: nontender, no swelling, no obvious deformity Extremities: no edema, no cyanosis, no clubbing Pulses: 2+ symmetric, upper and lower extremities, normal cap refill Neurological: alert, oriented x 3, CN2-12 intact, strength normal upper extremities and lower extremities, sensation normal throughout, DTRs 2+ throughout, no cerebellar signs, gait normal Psychiatric: normal affect, behavior normal, pleasant , oriented x 3 Skin: Warm and dry.small ecchymotic area on left forearm/wrist - approx 1-2 cm, mildly tender if touched.    Adela Glimpse, NP   09/25/2023

## 2023-09-26 LAB — CBC WITH DIFFERENTIAL/PLATELET
Absolute Lymphocytes: 1836 {cells}/uL (ref 850–3900)
Absolute Monocytes: 433 {cells}/uL (ref 200–950)
Basophils Absolute: 61 {cells}/uL (ref 0–200)
Basophils Relative: 1 %
Eosinophils Absolute: 49 {cells}/uL (ref 15–500)
Eosinophils Relative: 0.8 %
HCT: 39.2 % (ref 35.0–45.0)
Hemoglobin: 13.2 g/dL (ref 11.7–15.5)
MCH: 32 pg (ref 27.0–33.0)
MCHC: 33.7 g/dL (ref 32.0–36.0)
MCV: 95.1 fL (ref 80.0–100.0)
MPV: 10 fL (ref 7.5–12.5)
Monocytes Relative: 7.1 %
Neutro Abs: 3721 {cells}/uL (ref 1500–7800)
Neutrophils Relative %: 61 %
Platelets: 268 10*3/uL (ref 140–400)
RBC: 4.12 10*6/uL (ref 3.80–5.10)
RDW: 11.5 % (ref 11.0–15.0)
Total Lymphocyte: 30.1 %
WBC: 6.1 10*3/uL (ref 3.8–10.8)

## 2023-09-26 LAB — COMPLETE METABOLIC PANEL WITH GFR
AG Ratio: 1.8 (calc) (ref 1.0–2.5)
ALT: 20 U/L (ref 6–29)
AST: 22 U/L (ref 10–35)
Albumin: 4.4 g/dL (ref 3.6–5.1)
Alkaline phosphatase (APISO): 60 U/L (ref 37–153)
BUN: 18 mg/dL (ref 7–25)
CO2: 30 mmol/L (ref 20–32)
Calcium: 10 mg/dL (ref 8.6–10.4)
Chloride: 100 mmol/L (ref 98–110)
Creat: 0.66 mg/dL (ref 0.60–1.00)
Globulin: 2.4 g/dL (ref 1.9–3.7)
Glucose, Bld: 115 mg/dL (ref 65–139)
Potassium: 4.9 mmol/L (ref 3.5–5.3)
Sodium: 137 mmol/L (ref 135–146)
Total Bilirubin: 0.4 mg/dL (ref 0.2–1.2)
Total Protein: 6.8 g/dL (ref 6.1–8.1)
eGFR: 94 mL/min/{1.73_m2} (ref >=60)

## 2023-09-26 LAB — LIPID PANEL
Cholesterol: 182 mg/dL (ref <200)
HDL: 69 mg/dL (ref >=50)
LDL Cholesterol (Calc): 93 mg/dL
Non-HDL Cholesterol (Calc): 113 mg/dL (ref <130)
Total CHOL/HDL Ratio: 2.6 (calc) (ref <5.0)
Triglycerides: 106 mg/dL (ref <150)

## 2023-09-26 LAB — HEMOGLOBIN A1C
Hgb A1c MFr Bld: 5.6 %{Hb} (ref <5.7)
Mean Plasma Glucose: 114 mg/dL
eAG (mmol/L): 6.3 mmol/L

## 2023-09-26 LAB — TSH: TSH: 0.67 m[IU]/L (ref 0.40–4.50)

## 2023-09-27 ENCOUNTER — Encounter: Payer: Self-pay | Admitting: Nurse Practitioner

## 2023-09-30 ENCOUNTER — Encounter: Payer: Self-pay | Admitting: Nurse Practitioner

## 2023-09-30 NOTE — Patient Instructions (Signed)

## 2023-10-02 ENCOUNTER — Other Ambulatory Visit: Payer: Self-pay | Admitting: Nurse Practitioner

## 2023-10-02 DIAGNOSIS — E039 Hypothyroidism, unspecified: Secondary | ICD-10-CM

## 2023-10-10 ENCOUNTER — Telehealth: Payer: Self-pay | Admitting: Family Medicine

## 2023-10-10 NOTE — Telephone Encounter (Signed)
Copied from CRM [id number]. Topic: Appointments - Scheduling Inquiry for Clinic >> Oct 10, 2023  3:43 PM [name] wrote: Reason for CRM: Patient calling in to request to establish with Dr. Linford Arnold. Patient's current PCP has passed away. (Told by supervisors that any PCP should be accepting these patients but would like to confirm.)

## 2023-10-17 NOTE — Telephone Encounter (Signed)
Ok to estab

## 2023-11-12 ENCOUNTER — Other Ambulatory Visit: Payer: Self-pay

## 2023-11-12 DIAGNOSIS — F3341 Major depressive disorder, recurrent, in partial remission: Secondary | ICD-10-CM

## 2023-11-12 MED ORDER — ESCITALOPRAM OXALATE 20 MG PO TABS: ORAL_TABLET | ORAL | 0 refills | Status: DC

## 2023-11-15 ENCOUNTER — Encounter: Payer: Self-pay | Admitting: Family Medicine

## 2023-11-15 ENCOUNTER — Ambulatory Visit (INDEPENDENT_AMBULATORY_CARE_PROVIDER_SITE_OTHER): Payer: Medicare Other | Admitting: Family Medicine

## 2023-11-15 VITALS — BP 102/52 | HR 63 | Ht 60.0 in | Wt 113.5 lb

## 2023-11-15 DIAGNOSIS — E559 Vitamin D deficiency, unspecified: Secondary | ICD-10-CM

## 2023-11-15 DIAGNOSIS — M810 Age-related osteoporosis without current pathological fracture: Secondary | ICD-10-CM | POA: Diagnosis not present

## 2023-11-15 DIAGNOSIS — J449 Chronic obstructive pulmonary disease, unspecified: Secondary | ICD-10-CM | POA: Diagnosis not present

## 2023-11-15 DIAGNOSIS — E162 Hypoglycemia, unspecified: Secondary | ICD-10-CM

## 2023-11-15 DIAGNOSIS — E782 Mixed hyperlipidemia: Secondary | ICD-10-CM

## 2023-11-15 DIAGNOSIS — F3342 Major depressive disorder, recurrent, in full remission: Secondary | ICD-10-CM | POA: Diagnosis not present

## 2023-11-15 DIAGNOSIS — E039 Hypothyroidism, unspecified: Secondary | ICD-10-CM | POA: Diagnosis not present

## 2023-11-15 DIAGNOSIS — R002 Palpitations: Secondary | ICD-10-CM | POA: Diagnosis not present

## 2023-11-15 DIAGNOSIS — R7309 Other abnormal glucose: Secondary | ICD-10-CM

## 2023-11-15 NOTE — Progress Notes (Unsigned)
 New Patient Office Visit  Subjective    Patient ID: Isabel Tyler, female    DOB: 03-10-1951  Age: 73 y.o. MRN: [id number]  CC:  Chief Complaint  Patient presents with   Establish Care    HPI Isabel Tyler presents to establish care ***  Outpatient Encounter Medications as of 11/15/2023  Medication Sig   Ascorbic Acid (VITAMIN C) 1000 MG tablet Take 1,000 mg by mouth daily.   aspirin EC 81 MG tablet Take 81 mg by mouth daily.   Calcium Citrate (CITRACAL PO) Take 1 tablet by mouth daily.   Cholecalciferol (VITAMIN D-3) 5000 units TABS Take 1 tablet by mouth daily.   escitalopram (LEXAPRO) 20 MG tablet Take 1 tablet by mouth Daily for Mood   Flaxseed, Linseed, (FLAXSEED OIL) 1200 MG CAPS Take 1 capsule by mouth daily.   levothyroxine (SYNTHROID) 75 MCG tablet Take 1 tablet BY MOUTH Daily on an Empty Stomach with only water for 30 minutes & No Antacid meds, Calcium or Magnesium for 4 hours and AVOID Biotin   Melatonin 5 MG/ML LIQD Take 5 mg by mouth at bedtime. PRN   metoprolol succinate (TOPROL-XL) 25 MG 24 hr tablet TAKE 1/2 TABLET(12.5 MG) BY MOUTH DAILY   Probiotic Product (PROBIOTIC-10) CAPS Take 1 capsule by mouth daily.   rosuvastatin (CRESTOR) 20 MG tablet Take  1 tablet  3 x  /week  for Cholesterol   tobramycin-dexamethasone (TOBRADEX) ophthalmic solution Place 1 drop into both eyes as needed.   No facility-administered encounter medications on file as of 11/15/2023.    Past Medical History:  Diagnosis Date   Allergy 1976   tree pollen   Anxiety    Diverticular disease    GERD (gastroesophageal reflux disease)    Heart murmur mitralvalve prolapse   Hx of diverticulitis of colon 04/04/2017   Hyperlipidemia, mixed    Hypothyroidism 2000   Osteoporosis 0steoporosis   Vitamin D deficiency     Past Surgical History:  Procedure Laterality Date   BREAST BIOPSY Left    benign   CHOLECYSTECTOMY N/A 02/08/2017   Procedure: LAPAROSCOPIC CHOLECYSTECTOMY WITH  INTRAOPERATIVE CHOLANGIOGRAM;  Surgeon: Manus Rudd, MD;  Location: [facility];  Service: General;  Laterality: N/A;   EYE SURGERY     Eyelid lifted   TONSILLECTOMY      Family History  Problem Relation Age of Onset   Heart disease Mother    Heart disease Father    Breast cancer Sister        around age 26   Stomach cancer Neg Hx    Colon cancer Neg Hx    Esophageal cancer Neg Hx    Pancreatic cancer Neg Hx    Rectal cancer Neg Hx     Social History   Socioeconomic History   Marital status: Married    Spouse name: Not on file   Number of children: Not on file   Years of education: Not on file   Highest education level: Not on file  Occupational History   Not on file  Tobacco Use   Smoking status: Never   Smokeless tobacco: Never  Vaping Use   Vaping status: Never Used  Substance and Sexual Activity   Alcohol use: Yes    Comment: occasional    Drug use: No   Sexual activity: Yes  Other Topics Concern   Not on file  Social History Narrative   Not on file   Social Drivers of Health   Financial  Resource Strain: Low Risk  (11/15/2023)   Overall Financial Resource Strain (CARDIA)    Difficulty of Paying Living Expenses: Not very hard  Food Insecurity: No Food Insecurity (11/15/2023)   Hunger Vital Sign    Worried About Running Out of Food in the Last Year: Never true    Ran Out of Food in the Last Year: Never true  Transportation Needs: No Transportation Needs (11/15/2023)   PRAPARE - Administrator, Civil Service (Medical): No    Lack of Transportation (Non-Medical): No  Physical Activity: Not on file  Stress: No Stress Concern Present (11/15/2023)   [employer] of Occupational Health - Occupational Stress Questionnaire    Feeling of Stress : Only a little  Social Connections: Not on file  Intimate Partner Violence: Not At Risk (11/15/2023)   Humiliation, Afraid, Rape, and Kick questionnaire    Fear of Current or Ex-Partner: No    Emotionally  Abused: No    Physically Abused: No    Sexually Abused: No    ROS      Objective    BP (!) 102/52   Pulse 63   Ht 5' (1.524 m)   Wt 113 lb 8 oz (51.5 kg)   SpO2 98%   BMI 22.17 kg/m   Physical Exam  {Labs (Optional):23779}    Assessment & Plan:   Problem List Items Addressed This Visit       Endocrine   Hypothyroidism - Primary     Musculoskeletal and Integument   Osteoporosis     Other   Major depressive disorder, recurrent, in full remission (HCC)    Return in about 3 months (around 02/15/2024).   Nani Gasser, MD

## 2023-11-16 DIAGNOSIS — E162 Hypoglycemia, unspecified: Secondary | ICD-10-CM | POA: Insufficient documentation

## 2023-11-16 NOTE — Assessment & Plan Note (Signed)
 She did recently have a normal A1c of 5.6 in January.  Just encouraged her to really work on getting in consistent protein to help stabilize glycemic levels.

## 2023-11-16 NOTE — Assessment & Plan Note (Signed)
 It looks like COPD was put on her chart back in 2021 based on a chest x-ray from 2018.  She said she was never told she had COPD she does not actually use her have an inhaler she has not had any respiratory issues.  Will have to investigate this diagnosis a little bit further.

## 2023-11-16 NOTE — Assessment & Plan Note (Signed)
 A1c was 5.03 September 2023.

## 2023-11-16 NOTE — Assessment & Plan Note (Signed)
Currently on supplementation

## 2023-11-16 NOTE — Assessment & Plan Note (Signed)
 Currently on levothyroxine and has been stable on her current regimen for quite some time.  Last TSH was 0.6 in January 2025.

## 2023-11-16 NOTE — Assessment & Plan Note (Signed)
 She currently takes calcium, vitamin D.  She is not on any kind of oral bisphosphonate.  Last bone density was November 2023 so she will be due for repeat this fall.

## 2023-11-16 NOTE — Assessment & Plan Note (Signed)
 Currently on Crestor 20 mg.  Last LDL in January was 93.

## 2023-11-16 NOTE — Assessment & Plan Note (Addendum)
 On metoprolol 25 mg to control symptoms.  Also follows with Dr. Tenny Craw, cardiology.

## 2023-12-07 ENCOUNTER — Encounter: Payer: Medicare Other | Admitting: Internal Medicine

## 2023-12-14 DIAGNOSIS — H353221 Exudative age-related macular degeneration, left eye, with active choroidal neovascularization: Secondary | ICD-10-CM | POA: Diagnosis not present

## 2023-12-17 DIAGNOSIS — H43813 Vitreous degeneration, bilateral: Secondary | ICD-10-CM | POA: Diagnosis not present

## 2023-12-17 DIAGNOSIS — H353221 Exudative age-related macular degeneration, left eye, with active choroidal neovascularization: Secondary | ICD-10-CM | POA: Diagnosis not present

## 2023-12-17 DIAGNOSIS — H2513 Age-related nuclear cataract, bilateral: Secondary | ICD-10-CM | POA: Diagnosis not present

## 2023-12-17 DIAGNOSIS — H353111 Nonexudative age-related macular degeneration, right eye, early dry stage: Secondary | ICD-10-CM | POA: Diagnosis not present

## 2023-12-17 DIAGNOSIS — H35033 Hypertensive retinopathy, bilateral: Secondary | ICD-10-CM | POA: Diagnosis not present

## 2023-12-18 ENCOUNTER — Ambulatory Visit: Admitting: Family Medicine

## 2023-12-18 ENCOUNTER — Encounter: Payer: Medicare Other | Admitting: Internal Medicine

## 2024-01-17 DIAGNOSIS — H2513 Age-related nuclear cataract, bilateral: Secondary | ICD-10-CM | POA: Diagnosis not present

## 2024-01-17 DIAGNOSIS — H43813 Vitreous degeneration, bilateral: Secondary | ICD-10-CM | POA: Diagnosis not present

## 2024-01-17 DIAGNOSIS — H353221 Exudative age-related macular degeneration, left eye, with active choroidal neovascularization: Secondary | ICD-10-CM | POA: Diagnosis not present

## 2024-01-17 DIAGNOSIS — H353111 Nonexudative age-related macular degeneration, right eye, early dry stage: Secondary | ICD-10-CM | POA: Diagnosis not present

## 2024-01-17 DIAGNOSIS — H35033 Hypertensive retinopathy, bilateral: Secondary | ICD-10-CM | POA: Diagnosis not present

## 2024-01-28 DIAGNOSIS — L820 Inflamed seborrheic keratosis: Secondary | ICD-10-CM | POA: Diagnosis not present

## 2024-01-28 DIAGNOSIS — D225 Melanocytic nevi of trunk: Secondary | ICD-10-CM | POA: Diagnosis not present

## 2024-01-28 DIAGNOSIS — L821 Other seborrheic keratosis: Secondary | ICD-10-CM | POA: Diagnosis not present

## 2024-02-03 ENCOUNTER — Other Ambulatory Visit: Payer: Self-pay

## 2024-02-03 ENCOUNTER — Ambulatory Visit
Admission: EM | Admit: 2024-02-03 | Discharge: 2024-02-03 | Disposition: A | Discharge status: Home / Self Care | Attending: Family Medicine | Admitting: Family Medicine

## 2024-02-03 DIAGNOSIS — R197 Diarrhea, unspecified: Secondary | ICD-10-CM

## 2024-02-03 MED ORDER — AZITHROMYCIN 250 MG PO TABS: ORAL_TABLET | ORAL | 0 refills | Status: DC

## 2024-02-03 NOTE — ED Triage Notes (Signed)
 Diarrhea and chills x 9 days. Has tried brat diet, pepto, immodium.

## 2024-02-03 NOTE — ED Provider Notes (Signed)
 [facility]    CSN: [id number] Arrival date & time: 02/03/24  1356      History   Chief Complaint Chief Complaint  Patient presents with   Diarrhea    HPI VALENTINE KUECHLE is a 73 y.o. female.   Patient is here with her husband.  They both have the same history.  9 days ago they went out to eat and had [place] food with another.  Nobody else got sick but Retha and her husband.  They had dinner 1 night, leftovers the next day, and then the following morning started having diarrhea.  Uncertain if this is related.  No recent travel.  No recent camping.  No other exposure to illness.  They both have abdominal cramping and watery diarrhea for 9 days now.  Patient states that she is trying a brat diet.  Bland foods.  Try to keep up with her fluids.  She is taken Pepto-Bismol and Imodium.  Continues to have watery diarrhea several times a day, anytime she eats or drinks anything.  No fever or chills    Past Medical History:  Diagnosis Date   Allergy 1976   tree pollen   Anxiety    Diverticular disease    GERD (gastroesophageal reflux disease)    Heart murmur mitralvalve prolapse   Hx of diverticulitis of colon 04/04/2017   Hyperlipidemia, mixed    Hypothyroidism 2000   Osteoporosis 0steoporosis   Vitamin D  deficiency     Patient Active Problem List   Diagnosis Date Noted   Hypoglycemia 11/16/2023   Osteoporosis 10/07/2019   Rosacea 12/12/2018   Trigger thumb, left thumb 09/26/2018   Lumbar spinal stenosis 09/03/2018   Vitamin D  deficiency 06/06/2018   Abnormal glucose 06/06/2018   Palpitations 06/06/2018   Gastroesophageal reflux disease 04/04/2017   Hepatic hemangioma, Rt lobe (7 cm) Jan 2017 12/12/2016   Hyperlipidemia, mixed 10/23/2016   Hypothyroidism 10/23/2016   Major depressive disorder, recurrent, in full remission (HCC) 10/23/2016   Diverticulosis of colon without hemorrhage 10/23/2016    Past Surgical History:  Procedure Laterality Date    BREAST BIOPSY Left    benign   CHOLECYSTECTOMY N/A 02/08/2017   Procedure: LAPAROSCOPIC CHOLECYSTECTOMY WITH INTRAOPERATIVE CHOLANGIOGRAM;  Surgeon: Dareen Ebbing, MD;  Location: [facility];  Service: General;  Laterality: N/A;   EYE SURGERY     Eyelid lifted   TONSILLECTOMY      OB History   No obstetric history on file.      Home Medications    Prior to Admission medications   Medication Sig Start Date End Date Taking? Authorizing Provider  azithromycin  (ZITHROMAX  Z-PAK) 250 MG tablet Take 1 pill a day for 3 days with food 02/03/24  Yes Stephany Ehrich, MD  Ascorbic Acid (VITAMIN C) 1000 MG tablet Take 1,000 mg by mouth daily.    [provider]  aspirin EC 81 MG tablet Take 81 mg by mouth daily.    [provider]  Calcium  Citrate (CITRACAL PO) Take 1 tablet by mouth daily.    [provider]  Cholecalciferol (VITAMIN D -3) 5000 units TABS Take 1 tablet by mouth daily.    [provider]  escitalopram  (LEXAPRO ) 20 MG tablet Take 1 tablet by mouth Daily for Mood 11/12/23   Webb, Padonda B, FNP  Flaxseed, Linseed, (FLAXSEED OIL) 1200 MG CAPS Take 1 capsule by mouth daily.    [provider]  levothyroxine  (SYNTHROID ) 75 MCG tablet Take 1 tablet BY MOUTH Daily  on an Empty Stomach with only water for 30 minutes & No Antacid meds, Calcium  or Magnesium for 4 hours and AVOID Biotin 10/02/23   Cranford, Tonya, NP  Melatonin 5 MG/ML LIQD Take 5 mg by mouth at bedtime. PRN    [provider]  metoprolol  succinate (TOPROL -XL) 25 MG 24 hr tablet TAKE 1/2 TABLET(12.5 MG) BY MOUTH DAILY 06/22/23   Wilkinson, Dana E, FNP  Probiotic Product (PROBIOTIC-10) CAPS Take 1 capsule by mouth daily.    [provider]  rosuvastatin  (CRESTOR ) 20 MG tablet Take  1 tablet  3 x  /week  for Cholesterol 01/08/23   Wilkinson, Dana E, FNP  tobramycin-dexamethasone  [facility]) ophthalmic solution Place 1 drop into both eyes as needed. 08/31/23   [provider]    Family History Family History  Problem Relation Age of Onset   Heart disease Mother    Heart disease Father    Breast cancer Sister        around age 56   Stomach cancer Neg Hx    Colon cancer Neg Hx    Esophageal cancer Neg Hx    Pancreatic cancer Neg Hx    Rectal cancer Neg Hx     Social History Social History   Tobacco Use   Smoking status: Never   Smokeless tobacco: Never  Vaping Use   Vaping status: Never Used  Substance Use Topics   Alcohol use: Yes    Comment: occasional    Drug use: No     Allergies   Ciprofloxacin  and Z-pak [azithromycin ]   Review of Systems Review of Systems See HPI  Physical Exam Triage Vital Signs ED Triage Vitals  Encounter Vitals Group     BP 02/03/24 1404 102/72     Systolic BP Percentile --      Diastolic BP Percentile --      Pulse Rate 02/03/24 1404 70     Resp 02/03/24 1404 16     Temp 02/03/24 1404 99 F (37.2 C)     Temp src --      SpO2 02/03/24 1404 94 %     Weight --      Height --      Head Circumference --      Peak Flow --      Pain Score 02/03/24 1406 5     Pain Loc --      Pain Education --      Exclude from Growth Chart --    No data found.  Updated Vital Signs BP 102/72   Pulse 70   Temp 99 F (37.2 C)   Resp 16   SpO2 94%       Physical Exam Constitutional:      General: She is not in acute distress.    Appearance: She is well-developed and normal weight.  HENT:     Head: Normocephalic and atraumatic.     Mouth/Throat:     Mouth: Mucous membranes are moist.  Eyes:     Conjunctiva/sclera: Conjunctivae normal.     Pupils: Pupils are equal, round, and reactive to light.  Cardiovascular:     Rate and Rhythm: Normal rate.  Pulmonary:     Effort: Pulmonary effort is normal. No respiratory distress.  Abdominal:     General: Abdomen is flat. There is no distension.     Palpations: Abdomen is soft.     Comments: Hyperactive bowel sounds.  No tenderness or organomegaly   Musculoskeletal:  General: Normal range of motion.     Cervical back: Normal range of motion.  Skin:    General: Skin is warm and dry.  Neurological:     Mental Status: She is alert.      UC Treatments / Results  Labs (all labs ordered are listed, but only abnormal results are displayed) Labs Reviewed - No data to display  EKG   Radiology No results found.  Procedures Procedures (including critical care time)  Medications Ordered in UC Medications - No data to display  Initial Impression / Assessment and Plan / UC Course  I have reviewed the triage vital signs and the nursing notes.  Pertinent labs & imaging results that were available during my care of the patient were reviewed by me and considered in my medical decision making (see chart for details).     Final Clinical Impressions(s) / UC Diagnoses   Final diagnoses:  Diarrhea in adult patient   Discharge Instructions      Take azithromycin  1 pill a day for 3 days.  Take with small amount of food or crackers Continue to push fluids and prevent dehydration May take Imodium to reduce the amount of watery diarrhea The stool culture report will be available in MyChart.  You will be called if any positive results  ED Prescriptions     Medication Sig Dispense Auth. Provider   azithromycin  (ZITHROMAX  Z-PAK) 250 MG tablet Take 1 pill a day for 3 days with food 3 tablet Stephany Ehrich, MD      PDMP not reviewed this encounter.   Stephany Ehrich, MD 02/03/24 [phone]

## 2024-02-03 NOTE — Discharge Instructions (Signed)
 Take azithromycin  1 pill a day for 3 days.  Take with small amount of food or crackers Continue to push fluids and prevent dehydration May take Imodium to reduce the amount of watery diarrhea The stool culture report will be available in MyChart.  You will be called if any positive results

## 2024-02-19 ENCOUNTER — Encounter: Payer: Self-pay | Admitting: Family Medicine

## 2024-02-19 ENCOUNTER — Ambulatory Visit (INDEPENDENT_AMBULATORY_CARE_PROVIDER_SITE_OTHER): Admitting: Family Medicine

## 2024-02-19 VITALS — BP 95/49 | HR 63 | Ht 60.0 in | Wt 113.1 lb

## 2024-02-19 DIAGNOSIS — E039 Hypothyroidism, unspecified: Secondary | ICD-10-CM | POA: Diagnosis not present

## 2024-02-19 DIAGNOSIS — R7309 Other abnormal glucose: Secondary | ICD-10-CM

## 2024-02-19 LAB — POCT GLYCOSYLATED HEMOGLOBIN (HGB A1C): Hemoglobin A1C: 5.3 % (ref 4.0–5.6)

## 2024-02-19 NOTE — Progress Notes (Unsigned)
   Established Patient Office Visit  Subjective  Patient ID: Isabel Tyler, female    DOB: [birth year]  Age: 73 y.o. MRN: [id number]  Chief Complaint  Patient presents with   Medical Management of Chronic Issues    Pt was advised to come back to discuss COPD. Xray from 2010 mentions COPD.     HPI  Isabel Tyler is here for follow-up today I just saw her in March to establish care.  Hypothyroidism - Taking medication regularly in the AM away from food and vitamins, etc. No recent change to skin, hair, or energy levels.  Since I last saw her she did see Dr. Elnor, ophthalmology for exudative age-related macular degeneration and hypertensive retinopathy.  She also went to urgent care earlier this month for diarrhea.  It had started after she had eaten out.  Her husband was sick as well.  She had tried Pepto-Bismol and Imodium.  She was treated with azithromycin ..   {History (Optional):23778}  ROS    Objective:     BP (!) 95/49   Pulse 63   Ht 5' (1.524 m)   Wt 113 lb 1.6 oz (51.3 kg)   SpO2 97%   BMI 22.09 kg/m  {Vitals History (Optional):23777}  Physical Exam Vitals reviewed.  Constitutional:      Appearance: Normal appearance.  HENT:     Head: Normocephalic.  Pulmonary:     Effort: Pulmonary effort is normal.   Neurological:     Mental Status: She is alert and oriented to person, place, and time.   Psychiatric:        Mood and Affect: Mood normal.        Behavior: Behavior normal.      Results for orders placed or performed in visit on 02/19/24  POCT HgB A1C  Result Value Ref Range   Hemoglobin A1C 5.3 4.0 - 5.6 %   HbA1c POC (<> result, manual entry)     HbA1c, POC (prediabetic range)     HbA1c, POC (controlled diabetic range)      {Labs (Optional):23779}  The 10-year ASCVD risk score (Arnett DK, et al., 2019) is: 5%    Assessment & Plan:   Problem List Items Addressed This Visit       Endocrine   Hypothyroidism - Primary     Other    Abnormal glucose   A1c looks fantastic today at 5.3.  It looks like the last several A1c readings have been under 5.7 which is great we can probably just go to checking this yearly again.      Relevant Orders   POCT HgB A1C (Completed)    Return in about 2 months (around 04/28/2024) for Medicare Wellness .    Isabel Byars, MD

## 2024-02-19 NOTE — Assessment & Plan Note (Signed)
 A1c looks fantastic today at 5.3.  It looks like the last several A1c readings have been under 5.7 which is great we can probably just go to checking this yearly again.

## 2024-02-20 NOTE — Assessment & Plan Note (Addendum)
 Recheck TSH. She has been really stable on current dose for several years.  Continue levothyroxine  75 mcg daily.

## 2024-02-28 ENCOUNTER — Other Ambulatory Visit: Payer: Self-pay | Admitting: Family

## 2024-02-28 ENCOUNTER — Encounter: Payer: Self-pay | Admitting: Family Medicine

## 2024-02-28 DIAGNOSIS — F3341 Major depressive disorder, recurrent, in partial remission: Secondary | ICD-10-CM

## 2024-02-28 MED ORDER — ESCITALOPRAM OXALATE 20 MG PO TABS: ORAL_TABLET | ORAL | 1 refills | Status: DC

## 2024-03-13 ENCOUNTER — Encounter: Payer: Self-pay | Admitting: Family Medicine

## 2024-03-13 DIAGNOSIS — H353221 Exudative age-related macular degeneration, left eye, with active choroidal neovascularization: Secondary | ICD-10-CM | POA: Diagnosis not present

## 2024-03-24 ENCOUNTER — Other Ambulatory Visit: Payer: Self-pay | Admitting: Nurse Practitioner

## 2024-03-24 DIAGNOSIS — E782 Mixed hyperlipidemia: Secondary | ICD-10-CM

## 2024-04-08 ENCOUNTER — Other Ambulatory Visit: Payer: Self-pay | Admitting: Family Medicine

## 2024-04-09 ENCOUNTER — Encounter: Payer: Self-pay | Admitting: Family Medicine

## 2024-04-09 DIAGNOSIS — E782 Mixed hyperlipidemia: Secondary | ICD-10-CM

## 2024-04-10 ENCOUNTER — Ambulatory Visit: Payer: Medicare Other | Admitting: Nurse Practitioner

## 2024-04-10 MED ORDER — ROSUVASTATIN CALCIUM 20 MG PO TABS: ORAL_TABLET | ORAL | 0 refills | Status: DC

## 2024-04-10 NOTE — Telephone Encounter (Signed)
 Requesting rx rf of Rosuvastatin  20mg   Last written 01/08/2023 Last OV 02/19/2024 Last Lipid panel 09/25/2023- normal Upcoming appt 05/01/2024 [facility] Refilled per protocol as last written without refills until appt

## 2024-05-01 ENCOUNTER — Ambulatory Visit

## 2024-05-01 VITALS — BP 94/79 | HR 54 | Ht 60.0 in | Wt 110.0 lb

## 2024-05-01 DIAGNOSIS — Z0000 Encounter for general adult medical examination without abnormal findings: Secondary | ICD-10-CM | POA: Diagnosis not present

## 2024-05-01 NOTE — Progress Notes (Signed)
 Subjective:   Isabel Tyler is a 73 y.o. female who presents for Medicare Annual (Subsequent) preventive examination.  Visit Complete: In person  Patient Medicare AWV questionnaire was completed by the patient on n/a; I have confirmed that all information answered by patient is correct and no changes since this date.  Cardiac Risk Factors include: advanced age (>36men, >51 women);dyslipidemia;hypertension     Objective:    Today's Vitals   05/01/24 1104  BP: 94/79  Pulse: (!) 54  SpO2: 98%  Weight: 110 lb (49.9 kg)  Height: 5' (1.524 m)   Body mass index is 21.48 kg/m.     05/01/2024   11:22 AM 04/09/2023    4:23 PM 01/17/2022   11:59 AM 12/29/2020    2:53 PM 10/07/2019   10:39 AM 09/05/2018   10:57 AM 07/16/2018    2:34 PM  Advanced Directives  Does Patient Have a Medical Advance Directive? Yes Yes Yes Yes Yes Yes  Yes   Type of Estate agent of [place];Living will Healthcare Power of [place];Living will Healthcare Power of [place];Living will Healthcare Power of [place];Living will Healthcare Power of [place];Living will Healthcare Power of [place];Living will Healthcare Power of [place];Living will  Does patient want to make changes to medical advance directive? No - Patient declined No - Patient declined No - Patient declined No - Patient declined No - Patient declined  Yes (MAU/Ambulatory/Procedural Areas - Information given)   Copy of Healthcare Power of Attorney in Chart? No - copy requested No - copy requested No - copy requested No - copy requested No - copy requested No - copy requested    Would patient like information on creating a medical advance directive?       Yes (MAU/Ambulatory/Procedural Areas - Information given)      Data saved with a previous flowsheet row definition     Current Medications (verified) Outpatient Encounter Medications as of 05/01/2024  Medication Sig   Ascorbic Acid (VITAMIN C) 1000 MG tablet Take 1,000 mg by  mouth daily.   aspirin EC 81 MG tablet Take 81 mg by mouth daily.   Calcium  Citrate (CITRACAL PO) Take 1 tablet by mouth daily.   Cholecalciferol (VITAMIN D -3) 5000 units TABS Take 1 tablet by mouth daily.   escitalopram  (LEXAPRO ) 20 MG tablet Take 1 tablet by mouth Daily for Mood   Flaxseed, Linseed, (FLAXSEED OIL) 1200 MG CAPS Take 1 capsule by mouth daily.   levothyroxine  (SYNTHROID ) 75 MCG tablet Take 1 tablet BY MOUTH Daily on an Empty Stomach with only water for 30 minutes & No Antacid meds, Calcium  or Magnesium for 4 hours and AVOID Biotin   Melatonin 5 MG/ML LIQD Take 5 mg by mouth at bedtime. PRN   metoprolol  succinate (TOPROL -XL) 25 MG 24 hr tablet TAKE 1/2 TABLET(12.5 MG) BY MOUTH DAILY   Probiotic Product (PROBIOTIC-10) CAPS Take 1 capsule by mouth daily.   rosuvastatin  (CRESTOR ) 20 MG tablet Take  1 tablet  3 x  /week  for Cholesterol   tobramycin-dexamethasone  (TOBRADEX) ophthalmic solution Place 1 drop into both eyes as needed.   No facility-administered encounter medications on file as of 05/01/2024.    Allergies (verified) Ciprofloxacin  and Z-pak [azithromycin ]   History: Past Medical History:  Diagnosis Date   Allergy 1976   tree pollen   Anxiety    Diverticular disease    GERD (gastroesophageal reflux disease)    Heart murmur mitralvalve prolapse   Hx of diverticulitis of colon 04/04/2017  Hyperlipidemia, mixed    Hypothyroidism 2000   Osteoporosis 0steoporosis   Vitamin D  deficiency    Past Surgical History:  Procedure Laterality Date   BREAST BIOPSY Left    benign   CHOLECYSTECTOMY N/A 02/08/2017   Procedure: LAPAROSCOPIC CHOLECYSTECTOMY WITH INTRAOPERATIVE CHOLANGIOGRAM;  Surgeon: Belinda Cough, MD;  Location: [facility];  Service: General;  Laterality: N/A;   EYE SURGERY     Eyelid lifted   TONSILLECTOMY     Family History  Problem Relation Age of Onset   Heart disease Mother    Heart disease Father    Breast cancer Sister        around age 66    Stomach cancer Neg Hx    Colon cancer Neg Hx    Esophageal cancer Neg Hx    Pancreatic cancer Neg Hx    Rectal cancer Neg Hx    Social History   Socioeconomic History   Marital status: Married    Spouse name: Not on file   Number of children: Not on file   Years of education: Not on file   Highest education level: Not on file  Occupational History   Not on file  Tobacco Use   Smoking status: Never   Smokeless tobacco: Never  Vaping Use   Vaping status: Never Used  Substance and Sexual Activity   Alcohol use: Yes    Comment: occasional    Drug use: No   Sexual activity: Yes  Other Topics Concern   Not on file  Social History Narrative   Isabel Tyler lives with her husband. She enjoys reading and baking.    Social Drivers of Corporate investment banker Strain: Low Risk  (05/01/2024)   Overall Financial Resource Strain (CARDIA)    Difficulty of Paying Living Expenses: Not hard at all  Food Insecurity: No Food Insecurity (05/01/2024)   Hunger Vital Sign    Worried About Running Out of Food in the Last Year: Never true    Ran Out of Food in the Last Year: Never true  Transportation Needs: No Transportation Needs (05/01/2024)   PRAPARE - Administrator, Civil Service (Medical): No    Lack of Transportation (Non-Medical): No  Physical Activity: Sufficiently Active (05/01/2024)   Exercise Vital Sign    Days of Exercise per Week: 3 days    Minutes of Exercise per Session: 60 min  Stress: Stress Concern Present (05/01/2024)   [employer] of Occupational Health - Occupational Stress Questionnaire    Feeling of Stress: To some extent  Social Connections: Socially Integrated (05/01/2024)   Social Connection and Isolation Panel    Frequency of Communication with Friends and Family: More than three times a week    Frequency of Social Gatherings with Friends and Family: More than three times a week    Attends Religious Services: More than 4 times per year    Active Member of  [employer] or Organizations: Yes    Attends Engineer, structural: More than 4 times per year    Marital Status: Married    Tobacco Counseling Counseling given: Not Answered   Clinical Intake:  Pre-visit preparation completed: Yes  Pain : No/denies pain     BMI - recorded: 21.48 Nutritional Status: BMI of 19-24  Normal Nutritional Risks: None Diabetes: No  How often do you need to have someone help you when you read instructions, pamphlets, or other written materials from your doctor or pharmacy?: 1 - Never What is the  last grade level you completed in school?: 14  Interpreter Needed?: No      Activities of Daily Living    05/01/2024   11:11 AM 06/17/2023   12:36 PM  In your present state of health, do you have any difficulty performing the following activities:  Hearing? 1 0  Comment Hearing aids   Vision? 0 0  Difficulty concentrating or making decisions? 0 0  Walking or climbing stairs? 0 0  Dressing or bathing? 0 0  Doing errands, shopping? 0 0  Preparing Food and eating ? N   Using the Toilet? N   In the past six months, have you accidently leaked urine? N   Do you have problems with loss of bowel control? N   Managing your Medications? N   Managing your Finances? N   Housekeeping or managing your Housekeeping? N     Patient Care Team: Alvan Dorothyann BIRCH, MD as PCP - General (Family Medicine) Okey Vina GAILS, MD as PCP - Cardiology (Cardiology) Latisha Medford, MD as Consulting Physician (Obstetrics and Gynecology)  Indicate any recent Medical Services you may have received from other than [facility] providers in the past year (date may be approximate).     Assessment:   This is a routine wellness examination for Isabel Tyler.  Hearing/Vision screen No results found.   Goals Addressed             This Visit's Progress    Patient Stated       Patient states she would like to stay active and healthy.        Depression Screen    05/01/2024    11:20 AM 02/19/2024    5:04 PM 11/15/2023    9:10 AM 06/17/2023   12:35 PM 04/09/2023    4:24 PM 11/30/2022    7:39 PM 04/24/2022   11:06 PM  PHQ 2/9 Scores  PHQ - 2 Score 0 0 0 0 0 0 0  PHQ- 9 Score  0         Fall Risk    05/01/2024   11:23 AM 02/19/2024    5:03 PM 11/15/2023    9:06 AM 06/17/2023   12:35 PM 04/09/2023    4:23 PM  Fall Risk   Falls in the past year? 0 0 0 0 0  Number falls in past yr: 0 0 0  0  Injury with Fall? 0 0 0  0  Risk for fall due to : No Fall Risks No Fall Risks No Fall Risks No Fall Risks No Fall Risks  Follow up Falls evaluation completed Falls evaluation completed Falls evaluation completed Falls prevention discussed;Education provided;Falls evaluation completed Falls evaluation completed;Falls prevention discussed    MEDICARE RISK AT HOME: Medicare Risk at Home Any stairs in or around the home?: Yes If so, are there any without handrails?: Yes Home free of loose throw rugs in walkways, pet beds, electrical cords, etc?: Yes Adequate lighting in your home to reduce risk of falls?: Yes Life alert?: No Use of a cane, walker or w/c?: No Grab bars in the bathroom?: No Shower chair or bench in shower?: No Elevated toilet seat or a handicapped toilet?: No  TIMED UP AND GO:  Was the test performed?  Yes  Length of time to ambulate 10 feet: 8 sec Gait steady and fast without use of assistive device    Cognitive Function:        05/01/2024   11:25 AM  6CIT Screen  What Year?  0 points  What month? 0 points  What time? 0 points  Count back from 20 0 points  Months in reverse 0 points  Repeat phrase 0 points  Total Score 0 points    Immunizations Immunization History  Administered Date(s) Administered   INFLUENZA, HIGH DOSE SEASONAL PF 07/16/2018, 09/04/2023   Influenza Inj Mdck Quad With Preservative 07/27/2017   Influenza,inj,Quad PF,6+ Mos 07/26/2021, 08/31/2022   PFIZER(Purple Top)SARS-COV-2 Vaccination 10/04/2019, 10/30/2019, 06/28/2020    PPD Test 04/05/2017   Pneumococcal Conjugate-13 06/06/2018   Pneumococcal Polysaccharide-23 01/17/2022   Td 12/29/2020   Tdap 08/28/2010    TDAP status: Up to date  Flu Vaccine status: Due, Education has been provided regarding the importance of this vaccine. Advised may receive this vaccine at local pharmacy or Health Dept. Aware to provide a copy of the vaccination record if obtained from local pharmacy or Health Dept. Verbalized acceptance and understanding.  Pneumococcal vaccine status: Due, Education has been provided regarding the importance of this vaccine. Advised may receive this vaccine at local pharmacy or Health Dept. Aware to provide a copy of the vaccination record if obtained from local pharmacy or Health Dept. Verbalized acceptance and understanding.  Covid-19 vaccine status: Declined, Education has been provided regarding the importance of this vaccine but patient still declined. Advised may receive this vaccine at local pharmacy or Health Dept.or vaccine clinic. Aware to provide a copy of the vaccination record if obtained from local pharmacy or Health Dept. Verbalized acceptance and understanding.  Qualifies for Shingles Vaccine? Yes   Zostavax completed No   Shingrix Completed?: No.    Education has been provided regarding the importance of this vaccine. Patient has been advised to call insurance company to determine out of pocket expense if they have not yet received this vaccine. Advised may also receive vaccine at local pharmacy or Health Dept. Verbalized acceptance and understanding.  Screening Tests Health Maintenance  Topic Date Due   MAMMOGRAM  07/28/2023   INFLUENZA VACCINE  03/28/2024   COVID-19 Vaccine (4 - 2025-26 season) 04/28/2024   Medicare Annual Wellness (AWV)  05/01/2025   DTaP/Tdap/Td (3 - Td or Tdap) 12/30/2030   Colonoscopy  01/18/2031   Pneumococcal Vaccine: 50+ Years  Completed   DEXA SCAN  Completed   Hepatitis C Screening  Completed   HPV  VACCINES  Aged Out   Meningococcal B Vaccine  Aged Out   Zoster Vaccines- Shingrix  Discontinued    Health Maintenance  Health Maintenance Due  Topic Date Due   MAMMOGRAM  07/28/2023   INFLUENZA VACCINE  03/28/2024   COVID-19 Vaccine (4 - 2025-26 season) 04/28/2024    Colorectal cancer screening: Type of screening: Colonoscopy. Completed 01/17/2021. Repeat every 10 years  Mammogram status: Completed 07/2023. Repeat every year  Bone Density status: Completed 07/27/2022. Results reflect: Bone density results: OSTEOPOROSIS. Repeat every 2 years.  Lung Cancer Screening: (Low Dose CT Chest recommended if Age 68-80 years, 20 pack-year currently smoking OR have quit w/in 15years.) does not qualify.   Lung Cancer Screening Referral: n/a  Additional Screening:  Hepatitis C Screening: does qualify; Completed 07/16/2017  Vision Screening: Recommended annual ophthalmology exams for early detection of glaucoma and other disorders of the eye. Is the patient up to date with their annual eye exam?  Yes  Who is the provider or what is the name of the office in which the patient attends annual eye exams? Dr Elnor If pt is not established with a provider, would they like to  be referred to a provider to establish care? N/a.   Dental Screening: Recommended annual dental exams for proper oral hygiene   Community Resource Referral / Chronic Care Management: CRR required this visit?  No   CCM required this visit?  No     Plan:     I have personally reviewed and noted the following in the patient's chart:   Medical and social history Use of alcohol, tobacco or illicit drugs  Current medications and supplements including opioid prescriptions. Patient is not currently taking opioid prescriptions. Functional ability and status Nutritional status Physical activity Advanced directives List of other physicians Hospitalizations, surgeries, and ER visits in previous 12 months.  None Vitals Screenings to include cognitive, depression, and falls Referrals and appointments  In addition, I have reviewed and discussed with patient certain preventive protocols, quality metrics, and best practice recommendations. A written personalized care plan for preventive services as well as general preventive health recommendations were provided to patient.     Bonny Jon Mayor, CMA   05/01/2024   After Visit Summary: (In Person-Printed) AVS printed and given to the patient  Nurse Notes:   Isabel Tyler is a 73 y.o. female patient of Dorothyann Byars, MD who had a Medicare Annual Wellness Visit today. Isabel Tyler lives with her spouse. She reports that he is socially active and does interact with friends/family regularly. She is moderately physically active. She enjoys reading and baking.

## 2024-05-01 NOTE — Patient Instructions (Signed)
  Isabel Tyler , Thank you for taking time to come for your Medicare Wellness Visit. I appreciate your ongoing commitment to your health goals. Please review the following plan we discussed and let me know if I can assist you in the future.   These are the goals we discussed:  Goals      Exercise 150 min/wk Moderate Activity     Patient Stated     Patient states she would like to stay active and healthy.         This is a list of the screening recommended for you and due dates:  Health Maintenance  Topic Date Due   Mammogram  07/28/2023   Flu Shot  03/28/2024   COVID-19 Vaccine (4 - 2025-26 season) 04/28/2024   Medicare Annual Wellness Visit  05/01/2025   DTaP/Tdap/Td vaccine (3 - Td or Tdap) 12/30/2030   Colon Cancer Screening  01/18/2031   Pneumococcal Vaccine for age over 6  Completed   DEXA scan (bone density measurement)  Completed   Hepatitis C Screening  Completed   HPV Vaccine  Aged Out   Meningitis B Vaccine  Aged Out   Zoster (Shingles) Vaccine  Discontinued

## 2024-05-15 ENCOUNTER — Encounter: Payer: Self-pay | Admitting: Family Medicine

## 2024-05-15 ENCOUNTER — Ambulatory Visit (INDEPENDENT_AMBULATORY_CARE_PROVIDER_SITE_OTHER): Admitting: Family Medicine

## 2024-05-15 VITALS — BP 93/56 | HR 75 | Temp 98.4°F | Ht 60.0 in | Wt 110.0 lb

## 2024-05-15 DIAGNOSIS — F3342 Major depressive disorder, recurrent, in full remission: Secondary | ICD-10-CM | POA: Diagnosis not present

## 2024-05-15 DIAGNOSIS — Z23 Encounter for immunization: Secondary | ICD-10-CM

## 2024-05-15 DIAGNOSIS — F411 Generalized anxiety disorder: Secondary | ICD-10-CM | POA: Insufficient documentation

## 2024-05-15 DIAGNOSIS — I959 Hypotension, unspecified: Secondary | ICD-10-CM

## 2024-05-15 DIAGNOSIS — E039 Hypothyroidism, unspecified: Secondary | ICD-10-CM

## 2024-05-15 DIAGNOSIS — R5383 Other fatigue: Secondary | ICD-10-CM | POA: Diagnosis not present

## 2024-05-15 DIAGNOSIS — E559 Vitamin D deficiency, unspecified: Secondary | ICD-10-CM

## 2024-05-15 DIAGNOSIS — I471 Supraventricular tachycardia, unspecified: Secondary | ICD-10-CM | POA: Diagnosis not present

## 2024-05-15 MED ORDER — ESCITALOPRAM OXALATE 5 MG PO TABS: ORAL_TABLET | ORAL | 0 refills | Status: DC

## 2024-05-15 MED ORDER — SERTRALINE HCL 25 MG PO TABS: ORAL_TABLET | ORAL | 1 refills | Status: DC

## 2024-05-15 NOTE — Progress Notes (Signed)
 Acute Office Visit  Subjective:     Patient ID: Isabel Tyler, female    DOB: 1951-06-13, 73 y.o.   MRN: [id number]  Chief Complaint  Patient presents with   Follow-up    anxiety    HPI Patient is in today for   Discussed the use of AI scribe software for clinical note transcription with the patient, who gave verbal consent to proceed.  History of Present Illness Isabel Tyler is a 73 year old female who presents with concerns about her current medication's efficacy and persistent fatigue.  Psychological stress and anxiety - Increased anxiety and stress, particularly since her husband's total knee replacement surgery on August 20th and the responsibility of caring for a new puppy - Feels overwhelmed and unable to complete tasks, which increases her anxiety - Describes herself as someone who likes to get things done but is currently unable to do so - Has been taking Lexapro  for over ten years  Fatigue and sleep quality - Persistent fatigue with never feeling refreshed upon waking - Questions if low blood pressure may be contributing to tiredness - Concerned about whether her energy levels are appropriate for her age - No issues with snoring or significant sleep disturbances, but recent stress has affected sleep quality  Blood pressure and cardiac symptoms - Takes 1/2 tab metoprolol  for supraventricular tachycardia (SVT) - Low blood pressure readings, typically around 90/50 mmHg - No chest pain - Occasional discomfort attributed to hiatal hernia   ROS      Objective:    BP (!) 93/56 (BP Location: Right Arm, Patient Position: Sitting, Cuff Size: Normal)   Pulse 75   Temp 98.4 F (36.9 C) (Oral)   Ht 5' (1.524 m)   Wt 110 lb (49.9 kg)   SpO2 99%   BMI 21.48 kg/m    Physical Exam Vitals reviewed.  Constitutional:      Appearance: Normal appearance.  HENT:     Head: Normocephalic.  Pulmonary:     Effort: Pulmonary effort is normal.  Neurological:      Mental Status: She is alert and oriented to person, place, and time.  Psychiatric:        Mood and Affect: Mood normal.        Behavior: Behavior normal.     No results found for any visits on 05/15/24.      Assessment & Plan:   Problem List Items Addressed This Visit       Cardiovascular and Mediastinum   Supraventricular tachycardia (HCC)   Supraventricular tachycardia SVT managed with metoprolol . The patient reports no chest pain or unusual symptoms, but occasionally experiences a heartburn-like sensation that she attributes to a hiatal hernia. - Continue current metoprolol  regimen.        Endocrine   Hypothyroidism   Hypothyroidism Blood work to include thyroid  function test to ensure current management is adequate. - Include thyroid  function test in blood work.      Relevant Orders   CMP14+EGFR   CBC with Differential/Platelet   TSH   Iron, TIBC and Ferritin Panel   B12   Vitamin B1   Vitamin B6     Other   Vitamin D  deficiency   Will check further mental or deficiencies as well especially since she has been struggling with some fatigue.      Relevant Orders   CMP14+EGFR   CBC with Differential/Platelet   TSH   Iron, TIBC and Ferritin Panel   B12  Vitamin B1   Vitamin B6   Major depressive disorder, recurrent, in full remission (HCC)   Relevant Medications   escitalopram  (LEXAPRO ) 5 MG tablet   sertraline  (ZOLOFT ) 25 MG tablet   GAD (generalized anxiety disorder)   Relevant Medications   escitalopram  (LEXAPRO ) 5 MG tablet   sertraline  (ZOLOFT ) 25 MG tablet   Other Visit Diagnoses       Immunization due    -  Primary   Relevant Orders   Flu vaccine trivalent PF, 6mos and older(Flulaval,Afluria,Fluarix,Fluzone) (Completed)     Other fatigue       Relevant Orders   CMP14+EGFR   CBC with Differential/Platelet   TSH   Iron, TIBC and Ferritin Panel   B12   Vitamin B1   Vitamin B6     Hypotension, unspecified hypotension type            Meds ordered this encounter  Medications   escitalopram  (LEXAPRO ) 5 MG tablet    Sig: Take 3 tablets (15 mg total) by mouth daily for 7 days, THEN 2 tablets (10 mg total) daily for 7 days, THEN 1 tablet (5 mg total) daily for 7 days.    Dispense:  42 tablet    Refill:  0   sertraline  (ZOLOFT ) 25 MG tablet    Sig: Take 1 tablet (25 mg total) by mouth daily for 10 days, THEN 2 tablets (50 mg total) daily for 20 days.    Dispense:  50 tablet    Refill:  1    Assessment and Plan Assessment & Plan Anxiety disorder Chronic anxiety with inadequate response to Lexapro . Prefers to avoid Prozac. - Taper off Lexapro  using 5 mg tablets: take 15 mg for 7 days, then 10 mg for 7 days, then 5 mg for 7 days. - Initiate sertraline  (Zoloft ) after tapering Lexapro , with a one-week overlap at 5 mg Lexapro . - Follow up in 5-6 weeks to assess response to sertraline . - Consider counseling or therapy for additional support if needed.  Fatigue Chronic fatigue possibly due to stress, inadequate rest, and low blood pressure. - Order blood work to check thyroid  function and hemoglobin levels.  Hypotension Low blood pressure possibly contributing to fatigue. Metoprolol  for SVT may affect blood pressure. - Monitor blood pressure at home, especially in the morning. - Ensure adequate hydration, particularly in hot weather or during illness.      Return in about 7 weeks (around 07/03/2024) for New start medication.  Dorothyann Byars, MD

## 2024-05-15 NOTE — Assessment & Plan Note (Signed)
 Supraventricular tachycardia SVT managed with metoprolol . The patient reports no chest pain or unusual symptoms, but occasionally experiences a heartburn-like sensation that she attributes to a hiatal hernia. - Continue current metoprolol  regimen.

## 2024-05-15 NOTE — Assessment & Plan Note (Signed)
 Will check further mental or deficiencies as well especially since she has been struggling with some fatigue.

## 2024-05-15 NOTE — Assessment & Plan Note (Signed)
 Hypothyroidism Blood work to include thyroid  function test to ensure current management is adequate. - Include thyroid  function test in blood work.

## 2024-05-15 NOTE — Patient Instructions (Addendum)
 Take 3 tabs of the 5mg  lexapro  daily for 7 days, then 2 tabs daily for 7 days and then 1 tab daily for 7 days.   Once you start the 5mg  one a day you can start the sertraline , so they will overlap. Just follow the instructions on the sertraline  bottle.

## 2024-05-16 ENCOUNTER — Ambulatory Visit: Payer: Self-pay | Admitting: Family Medicine

## 2024-05-16 NOTE — Progress Notes (Signed)
 HI Nalla,  Your blood count is normal. Your B12 is high.  Are you taking extra B12? If you are please decrease how ofte you are taking it.  Metabolic panel and thyroid  looks good. Iron looks good so far.   Vitamin B1 and B6 are pending

## 2024-05-20 NOTE — Progress Notes (Signed)
 Hi Isabel Tyler, vitamin B1 looks good.  Vitamin B6 is still pending.

## 2024-05-22 DIAGNOSIS — H2513 Age-related nuclear cataract, bilateral: Secondary | ICD-10-CM | POA: Diagnosis not present

## 2024-05-22 DIAGNOSIS — H353221 Exudative age-related macular degeneration, left eye, with active choroidal neovascularization: Secondary | ICD-10-CM | POA: Diagnosis not present

## 2024-05-22 DIAGNOSIS — H35033 Hypertensive retinopathy, bilateral: Secondary | ICD-10-CM | POA: Diagnosis not present

## 2024-05-22 DIAGNOSIS — H353111 Nonexudative age-related macular degeneration, right eye, early dry stage: Secondary | ICD-10-CM | POA: Diagnosis not present

## 2024-05-22 DIAGNOSIS — H43813 Vitreous degeneration, bilateral: Secondary | ICD-10-CM | POA: Diagnosis not present

## 2024-05-23 LAB — CBC WITH DIFFERENTIAL/PLATELET
Basophils Absolute: 0.1 x10E3/uL (ref 0.0–0.2)
Basos: 1 %
EOS (ABSOLUTE): 0 x10E3/uL (ref 0.0–0.4)
Eos: 1 %
Hematocrit: 41.5 % (ref 34.0–46.6)
Hemoglobin: 13.5 g/dL (ref 11.1–15.9)
Immature Grans (Abs): 0 x10E3/uL (ref 0.0–0.1)
Immature Granulocytes: 0 %
Lymphocytes Absolute: 1.6 x10E3/uL (ref 0.7–3.1)
Lymphs: 34 %
MCH: 32.2 pg (ref 26.6–33.0)
MCHC: 32.5 g/dL (ref 31.5–35.7)
MCV: 99 fL — ABNORMAL HIGH (ref 79–97)
Monocytes Absolute: 0.4 x10E3/uL (ref 0.1–0.9)
Monocytes: 8 %
Neutrophils Absolute: 2.7 x10E3/uL (ref 1.4–7.0)
Neutrophils: 56 %
Platelets: 246 x10E3/uL (ref 150–450)
RBC: 4.19 x10E6/uL (ref 3.77–5.28)
RDW: 11.8 % (ref 11.7–15.4)
WBC: 4.7 x10E3/uL (ref 3.4–10.8)

## 2024-05-23 LAB — CMP14+EGFR
ALT: 26 IU/L (ref 0–32)
AST: 29 IU/L (ref 0–40)
Albumin: 4.5 g/dL (ref 3.9–4.9)
Alkaline Phosphatase: 75 IU/L (ref 49–135)
BUN/Creatinine Ratio: 21 (ref 12–28)
BUN: 15 mg/dL (ref 8–27)
Bilirubin Total: 0.6 mg/dL (ref 0.0–1.2)
CO2: 27 mmol/L (ref 20–29)
Calcium: 9.2 mg/dL (ref 8.7–10.3)
Chloride: 99 mmol/L (ref 96–106)
Creatinine, Ser: 0.7 mg/dL (ref 0.57–1.00)
Globulin, Total: 2.3 g/dL (ref 1.5–4.5)
Glucose: 95 mg/dL (ref 70–99)
Potassium: 4.9 mmol/L (ref 3.5–5.2)
Sodium: 137 mmol/L (ref 134–144)
Total Protein: 6.8 g/dL (ref 6.0–8.5)
eGFR: 93 mL/min/1.73 (ref >59)

## 2024-05-23 LAB — VITAMIN B6: Vitamin B6: 8.3 ug/L (ref 3.4–65.2)

## 2024-05-23 LAB — IRON,TIBC AND FERRITIN PANEL
Ferritin: 112 ng/mL (ref 15–150)
Iron Saturation: 47 % (ref 15–55)
Iron: 137 ug/dL (ref 27–139)
Total Iron Binding Capacity: 290 ug/dL (ref 250–450)
UIBC: 153 ug/dL (ref 118–369)

## 2024-05-23 LAB — VITAMIN B1: Thiamine: 105.1 nmol/L (ref 66.5–200.0)

## 2024-05-23 LAB — TSH: TSH: 0.973 u[IU]/mL (ref 0.450–4.500)

## 2024-05-23 LAB — VITAMIN B12: Vitamin B-12: >2000 pg/mL — ABNORMAL HIGH (ref 232–1245)

## 2024-05-23 NOTE — Progress Notes (Signed)
 Hi Isabel Tyler, your B6 looks good.

## 2024-06-12 DIAGNOSIS — H353221 Exudative age-related macular degeneration, left eye, with active choroidal neovascularization: Secondary | ICD-10-CM | POA: Diagnosis not present

## 2024-06-12 DIAGNOSIS — H527 Unspecified disorder of refraction: Secondary | ICD-10-CM | POA: Diagnosis not present

## 2024-06-12 DIAGNOSIS — H25813 Combined forms of age-related cataract, bilateral: Secondary | ICD-10-CM | POA: Diagnosis not present

## 2024-06-27 ENCOUNTER — Other Ambulatory Visit: Payer: Self-pay | Admitting: Family Medicine

## 2024-06-27 DIAGNOSIS — F411 Generalized anxiety disorder: Secondary | ICD-10-CM

## 2024-06-27 DIAGNOSIS — F3342 Major depressive disorder, recurrent, in full remission: Secondary | ICD-10-CM

## 2024-07-01 ENCOUNTER — Encounter: Payer: Self-pay | Admitting: Family Medicine

## 2024-07-01 ENCOUNTER — Ambulatory Visit (INDEPENDENT_AMBULATORY_CARE_PROVIDER_SITE_OTHER): Admitting: Family Medicine

## 2024-07-01 VITALS — BP 117/43 | HR 77 | Ht 60.0 in | Wt 109.1 lb

## 2024-07-01 DIAGNOSIS — K580 Irritable bowel syndrome with diarrhea: Secondary | ICD-10-CM | POA: Diagnosis not present

## 2024-07-01 DIAGNOSIS — F411 Generalized anxiety disorder: Secondary | ICD-10-CM | POA: Diagnosis not present

## 2024-07-01 DIAGNOSIS — M722 Plantar fascial fibromatosis: Secondary | ICD-10-CM

## 2024-07-01 DIAGNOSIS — K589 Irritable bowel syndrome without diarrhea: Secondary | ICD-10-CM | POA: Insufficient documentation

## 2024-07-01 NOTE — Progress Notes (Signed)
 Established Patient Office Visit  Patient ID: WILMOTH RASNIC, female    DOB: Aug 16, 1951  Age: 73 y.o. MRN: [id number] PCP: Alvan Dorothyann BIRCH, MD  Chief Complaint  Patient presents with   mood    Subjective:     HPI  Discussed the use of AI scribe software for clinical note transcription with the patient, who gave verbal consent to proceed.  History of Present Illness CECELIA GRACIANO is a 73 year old female who presents with anxiety and IBS symptoms.  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 - Sleep disturbances following recent stress, leading to increased melatonin intake - Currently taking sertraline  50 mg daily for approximately one month after switching from Lexapro ; uncertain of effectiveness  Irritable bowel syndrome symptoms - Chronic IBS with symptoms exacerbated by stress - Recent flare-up of diarrhea over the weekend following company - Uses Levsin  (hyoscyamine ) as needed for cramping and diarrhea, with good effect - Maintains a balanced diet and expresses interest in dietary modifications to manage symptoms  Right plantar fasciitis - Recurrence of plantar fasciitis in the right foot, previously experienced for over a year - Manages symptoms with stretching exercises and use of a frozen Coke bottle for relief - Has ordered additional items online to assist with symptom management - Concerned about long-term use of ibuprofen for pain due to potential kidney effects - Attempts to remain active despite pain  Psychosocial stressors - Increased daily stress due to responsibility of caring for a new puppy - Feels guilty leaving the house for extended periods due to the puppy's attachment and care needs      ROS    Objective:     There were no vitals taken for this  visit.    Physical Exam Vitals and nursing note reviewed.  Constitutional:      Appearance: Normal appearance.  HENT:     Head: Normocephalic and atraumatic.  Eyes:     Conjunctiva/sclera: Conjunctivae normal.  Cardiovascular:     Rate and Rhythm: Normal rate and regular rhythm.  Pulmonary:     Effort: Pulmonary effort is normal.     Breath sounds: Normal breath sounds.  Skin:    General: Skin is warm and dry.  Neurological:     Mental Status: She is alert.  Psychiatric:        Mood and Affect: Mood normal.      No results found for any visits on 07/01/24.     The 10-year ASCVD risk score (Arnett DK, et al., 2019) is: 5.5%    Assessment & Plan:   Problem List Items Addressed This Visit       Digestive   IBS (irritable bowel syndrome)- Diarrhea predominent - Primary   Irritable Bowel Syndrome (IBS) with Diarrhea IBS exacerbated by stress. Levsin  effective for symptoms. Low FODMAP diet suggested for symptom management. - Provide information on low FODMAP diet. - Continue Levsin  as needed. - Reassess diet effectiveness in 2-3 months.      Relevant Medications   hyoscyamine  (LEVSIN ) 0.125 MG tablet     Other   GAD (generalized anxiety disorder)   Persistent anxiety despite sertraline  50 mg. Dose increase to 100 mg considered due to low current dose and no significant side effects. Explained Lexapro  20 mg is approximately equivalent to 150 mg sertraline . - Increase sertraline  to 100  mg daily, reassess in 4 weeks. - Monitor for side effects and effectiveness. - Consider alternative medication if no improvement at 100 mg.      Other Visit Diagnoses       Plantar fasciitis of right foot           Assessment and Plan Assessment & Plan  Plantar Fasciitis Recurrence in right foot managed with stretching, icing, and supportive footwear. Long-term ibuprofen use discouraged due to potential side effects. - Review stretching exercises.H.O given.  - Advise  supportive sneakers indoors and outdoors. - Alternate ibuprofen with Tylenol . - Consider referral for injection or ultrasound therapy if no improvement in 1-2 months.  Vitamin B12 Supplementation Slightly elevated MCV with previously high B12 levels. Reduce supplementation frequency to prevent potential neuropathy. - Reduce B12 supplementation to every other day or three times a week. - Recheck B12 levels during next routine blood work.  Return in about 2 months (around 08/31/2024) for Mood.    Dorothyann Byars, MD [facility] at [facility]

## 2024-07-01 NOTE — Assessment & Plan Note (Signed)
 Irritable Bowel Syndrome (IBS) with Diarrhea IBS exacerbated by stress. Levsin  effective for symptoms. Low FODMAP diet suggested for symptom management. - Provide information on low FODMAP diet. - Continue Levsin  as needed. - Reassess diet effectiveness in 2-3 months.

## 2024-07-01 NOTE — Assessment & Plan Note (Signed)
 Persistent anxiety despite sertraline  50 mg. Dose increase to 100 mg considered due to low current dose and no significant side effects. Explained Lexapro  20 mg is approximately equivalent to 150 mg sertraline . - Increase sertraline  to 100 mg daily, reassess in 4 weeks. - Monitor for side effects and effectiveness. - Consider alternative medication if no improvement at 100 mg.

## 2024-07-01 NOTE — Patient Instructions (Signed)
 OK to increase sertraline  to 2 tabs once a day   See H.O for plantar fascitis

## 2024-07-08 ENCOUNTER — Ambulatory Visit (INDEPENDENT_AMBULATORY_CARE_PROVIDER_SITE_OTHER): Admitting: Sports Medicine

## 2024-07-08 ENCOUNTER — Encounter: Payer: Self-pay | Admitting: Sports Medicine

## 2024-07-08 ENCOUNTER — Ambulatory Visit: Payer: Self-pay

## 2024-07-08 VITALS — BP 110/70 | HR 85 | Temp 98.4°F | Wt 108.8 lb

## 2024-07-08 DIAGNOSIS — R197 Diarrhea, unspecified: Secondary | ICD-10-CM | POA: Diagnosis not present

## 2024-07-08 DIAGNOSIS — R112 Nausea with vomiting, unspecified: Secondary | ICD-10-CM

## 2024-07-08 MED ORDER — ONDANSETRON 4 MG PO TBDP: 4.0000 mg | ORAL_TABLET | Freq: Three times a day (TID) | ORAL | 0 refills | Status: DC | PRN

## 2024-07-08 NOTE — Progress Notes (Signed)
 Careteam: Patient Care Team: Alvan Dorothyann BIRCH, MD as PCP - General (Family Medicine) Okey Vina GAILS, MD as PCP - Cardiology (Cardiology) Latisha Medford, MD as Consulting Physician (Obstetrics and Gynecology)  PLACE OF SERVICE:  [facility]  Advanced Directive information    Allergies  Allergen Reactions   Ciprofloxacin  Other (See Comments)    Alleged tendonitis   Z-Pak [Azithromycin ] Other (See Comments)    Patient reports that the reaction was GI upset issues.    Chief Complaint  Patient presents with   Abdominal Pain    Pt has been having diarrhea and nausea since Sunday. She has also been having fatigue.     Discussed the use of AI scribe software for clinical note transcription with the patient, who gave verbal consent to proceed.  History of Present Illness  Isabel Tyler is a 73 year old female with IBS who presents with vomiting, diarrhea, and fatigue.  Symptoms began on Sunday at 2:00 AM with vomiting and diarrhea. Vomiting occurred a few times on Sunday and has not recurred since. She experiences significant fatigue and reports a sensation of drainage in her throat. No fever, nasal congestion, or sore throat, but a slight cough is present due to the drainage.  She attempted to eat saltine crackers and drink Gatorade yesterday, and today she tried chicken soup, which resulted in severe stomach pain lasting about 30 minutes. The pain was located in the upper abdomen and has since resolved. She feels slightly nauseous and has a decreased appetite, stating that 'the thought of food' is unappealing despite feeling hungry.  She has a history of IBS and experienced a diarrhea flare-up a couple of weeks ago, but notes that this episode feels different. She took Imodium today and has not had significant bowel movements since Sunday, when she experienced diarrhea. Stools are described as very mucous-like and foul-smelling, with no blood present. She has not been  hospitalized recently and has not taken antibiotics.  She recently increased her Zoloft  dosage to 100 mg, which she started last week or the week before. She mentions having Thai food with her daughter on Saturday, but her daughter did not experience any symptoms despite eating the same meal. She feels slightly lightheaded and attributes her exhaustion to her symptoms.    Review of Systems:  Review of Systems  Constitutional:  Positive for malaise/fatigue. Negative for chills and fever.  HENT:  Negative for congestion and sore throat.   Respiratory:  Negative for cough, sputum production and shortness of breath.   Cardiovascular:  Negative for chest pain, palpitations and leg swelling.  Gastrointestinal:  Positive for abdominal pain, diarrhea and nausea. Negative for heartburn.  Genitourinary:  Negative for dysuria, frequency and hematuria.  Musculoskeletal:  Negative for falls and myalgias.  Neurological:  Negative for dizziness, sensory change and focal weakness.   Negative unless indicated in HPI.   Past Medical History:  Diagnosis Date   Allergy 1976   tree pollen   Anxiety    Diverticular disease    GERD (gastroesophageal reflux disease)    Heart murmur mitralvalve prolapse   Hx of diverticulitis of colon 04/04/2017   Hyperlipidemia, mixed    Hypothyroidism 2000   Osteoporosis 0steoporosis   Vitamin D  deficiency    Past Surgical History:  Procedure Laterality Date   BREAST BIOPSY Left    benign   CHOLECYSTECTOMY N/A 02/08/2017   Procedure: LAPAROSCOPIC CHOLECYSTECTOMY WITH INTRAOPERATIVE CHOLANGIOGRAM;  Surgeon: Belinda Cough, MD;  Location: [facility];  Service: General;  Laterality: N/A;   EYE SURGERY     Eyelid lifted   TONSILLECTOMY     Social History:   reports that she has never smoked. She has never used smokeless tobacco. She reports current alcohol use. She reports that she does not use drugs.  Family History  Problem Relation Age of Onset   Heart disease  Mother    Heart disease Father    Breast cancer Sister        around age 60   Stomach cancer Neg Hx    Colon cancer Neg Hx    Esophageal cancer Neg Hx    Pancreatic cancer Neg Hx    Rectal cancer Neg Hx     Medications: Patient's Medications  New Prescriptions   No medications on file  Previous Medications   ASCORBIC ACID (VITAMIN C) 1000 MG TABLET    Take 1,000 mg by mouth daily.   ASPIRIN EC 81 MG TABLET    Take 81 mg by mouth daily.   CALCIUM  CITRATE (CITRACAL PO)    Take 1 tablet by mouth daily.   CHOLECALCIFEROL (VITAMIN D -3) 5000 UNITS TABS    Take 1 tablet by mouth daily.   FLAXSEED, LINSEED, (FLAXSEED OIL) 1200 MG CAPS    Take 1 capsule by mouth daily.   HYOSCYAMINE  (LEVSIN ) 0.125 MG TABLET    Take 0.125 mg by mouth every 4 (four) hours as needed for cramping.   LEVOTHYROXINE  (SYNTHROID ) 75 MCG TABLET    Take 1 tablet BY MOUTH Daily on an Empty Stomach with only water for 30 minutes & No Antacid meds, Calcium  or Magnesium for 4 hours and AVOID Biotin   MELATONIN 5 MG/ML LIQD    Take 5 mg by mouth at bedtime. PRN   METOPROLOL  SUCCINATE (TOPROL -XL) 25 MG 24 HR TABLET    TAKE 1/2 TABLET(12.5 MG) BY MOUTH DAILY   PROBIOTIC PRODUCT (PROBIOTIC-10) CAPS    Take 1 capsule by mouth daily.   ROSUVASTATIN  (CRESTOR ) 20 MG TABLET    Take  1 tablet  3 x  /week  for Cholesterol   SERTRALINE  (ZOLOFT ) 50 MG TABLET    Take 1 tablet (50 mg total) by mouth daily.   TOBRAMYCIN-DEXAMETHASONE  (TOBRADEX) OPHTHALMIC SOLUTION    Place 1 drop into both eyes as needed.  Modified Medications   No medications on file  Discontinued Medications   No medications on file    Physical Exam: Vitals:   07/08/24 1439  BP: 110/70  Pulse: 85  Temp: 98.4 F (36.9 C)  TempSrc: Oral  SpO2: 98%  Weight: 108 lb 12.8 oz (49.4 kg)   Body mass index is 21.25 kg/m. BP Readings from Last 3 Encounters:  07/08/24 110/70  07/01/24 (!) 117/43  05/15/24 (!) 93/56   Wt Readings from Last 3 Encounters:  07/08/24  108 lb 12.8 oz (49.4 kg)  07/01/24 109 lb 1.3 oz (49.5 kg)  05/15/24 110 lb (49.9 kg)    Physical Exam Constitutional:      Appearance: Normal appearance.  HENT:     Head: Normocephalic and atraumatic.  Cardiovascular:     Rate and Rhythm: Normal rate and regular rhythm.  Pulmonary:     Effort: Pulmonary effort is normal. No respiratory distress.     Breath sounds: Normal breath sounds. No wheezing.  Abdominal:     General: Bowel sounds are normal. There is no distension.     Tenderness: There is no abdominal tenderness. There is no guarding or  rebound.     Comments:    Musculoskeletal:        General: No swelling or tenderness.  Skin:    General: Skin is dry.  Neurological:     Mental Status: She is alert. Mental status is at baseline.     Sensory: No sensory deficit.     Motor: No weakness.     Labs reviewed: Basic Metabolic Panel: Recent Labs    09/25/23 1441 05/15/24 1143  NA 137 137  K 4.9 4.9  CL 100 99  CO2 30 27  GLUCOSE 115 95  BUN 18 15  CREATININE 0.66 0.70  CALCIUM  10.0 9.2  TSH 0.67 0.973   Liver Function Tests: Recent Labs    09/25/23 1441 05/15/24 1143  AST 22 29  ALT 20 26  ALKPHOS  --  75  BILITOT 0.4 0.6  PROT 6.8 6.8  ALBUMIN  --  4.5   No results for input(s): LIPASE, AMYLASE in the last 8760 hours. No results for input(s): AMMONIA in the last 8760 hours. CBC: Recent Labs    09/25/23 1441 05/15/24 1143  WBC 6.1 4.7  NEUTROABS 3,721 2.7  HGB 13.2 13.5  HCT 39.2 41.5  MCV 95.1 99*  PLT 268 246   Lipid Panel: Recent Labs    09/25/23 1441  CHOL 182  HDL 69  LDLCALC 93  TRIG 106  CHOLHDL 2.6   TSH: Recent Labs    09/25/23 1441 05/15/24 1143  TSH 0.67 0.973   A1C: Lab Results  Component Value Date   HGBA1C 5.3 02/19/2024    Assessment and Plan Assessment & Plan   1. Diarrhea, unspecified type (Primary) No abdominal tenderness on palpation  Vitals stable Instructed patient to take  imodium Informed patient about the warning signs including fevers, abdominal could be sign of colitis and need to go to ED - CBC w/Diff - Basic Metabolic Panel (BMET) - Clostridium Difficile by PCR([facility]); Future  2. Nausea and vomiting, unspecified vomiting type Denies acid reflux Instructed patient to take zofran  prn  - ondansetron  (ZOFRAN -ODT) 4 MG disintegrating tablet; Take 1 tablet (4 mg total) by mouth every 8 (eight) hours as needed for nausea or vomiting.  Dispense: 20 tablet; Refill: 0       No follow-ups on file.:   Quentavious Rittenhouse

## 2024-07-08 NOTE — Telephone Encounter (Signed)
 FYI Only or Action Required?: FYI only for provider: appointment scheduled on 07/08/24.  Patient was last seen in primary care on 07/01/2024 by Alvan Dorothyann BIRCH, MD.  Called Nurse Triage reporting Abdominal Pain.  Symptoms began today.  Interventions attempted: Nothing.  Symptoms are: unchanged.  Triage Disposition: See Physician Within 24 Hours  Patient/caregiver understands and will follow disposition?: Yes   Copied from CRM #[id number]. Topic: Clinical - Red Word Triage >> Jul 08, 2024  1:01 PM Montie POUR wrote: Red Word that prompted transfer to Nurse Triage:  She had diarrhea and throwing up Sunday morning and she took medication imodium and it stopped. She is now having really bad stomach pain in the middle part. Pain level is an 8. Reason for Disposition  [1] MODERATE pain (e.g., interferes with normal activities) AND [2] pain comes and goes (cramps) AND [3] present > 24 hours  (Exception: Pain with Vomiting or Diarrhea - see that Guideline.)  Answer Assessment - Initial Assessment Questions No available appts with pcp. Scheduled with alternative provider, 07/08/24. Patient reports daughter will drive to appt.  Advised call back or UC/ED if symptoms worsen.  1. LOCATION: Where does it hurt?      Middle of belly button, fatigue, decreased appetite Last BM Sunday; had diarrhea/vomiting. 2. RADIATION: Does the pain shoot anywhere else? (e.g., chest, back)     no 3. ONSET: When did the pain begin? (e.g., minutes, hours or days ago)      Sunday, but today abd pain started 1 hour ago 4. SUDDEN: Gradual or sudden onset?     Suddenly happened after eating chicken noodle soup 5. PATTERN Does the pain come and go, or is it constant?     constant 6. SEVERITY: How bad is the pain?  (e.g., Scale 1-10; mild, moderate, or severe)     5/10 currently; calmed down was 8/10  8. CAUSE: What do you think is causing the stomach pain? (e.g., gallstones, recent abdominal  surgery)     no 9. RELIEVING/AGGRAVATING FACTORS: What makes it better or worse? (e.g., antacids, bending or twisting motion, bowel movement)    Sitting up makes feel better 10. OTHER SYMPTOMS: Do you have any other symptoms? (e.g., back pain, diarrhea, fever, urination pain, vomiting)       Denies dizziness, faint, fever, n/v Sinus drainage, chills  Protocols used: Abdominal Pain - Female-A-AH

## 2024-07-09 LAB — CBC WITH DIFFERENTIAL/PLATELET
Basophils Absolute: 0 K/uL (ref 0.0–0.1)
Basophils Relative: 0.1 % (ref 0.0–3.0)
Eosinophils Absolute: 0 K/uL (ref 0.0–0.7)
Eosinophils Relative: 0.3 % (ref 0.0–5.0)
HCT: 41.8 % (ref 36.0–46.0)
Hemoglobin: 13.9 g/dL (ref 12.0–15.0)
Lymphocytes Relative: 14.6 % (ref 12.0–46.0)
Lymphs Abs: 1.1 K/uL (ref 0.7–4.0)
MCHC: 33.2 g/dL (ref 30.0–36.0)
MCV: 96.1 fl (ref 78.0–100.0)
Monocytes Absolute: 0.3 K/uL (ref 0.1–1.0)
Monocytes Relative: 3.8 % (ref 3.0–12.0)
Neutro Abs: 5.9 K/uL (ref 1.4–7.7)
Neutrophils Relative %: 81.2 % — ABNORMAL HIGH (ref 43.0–77.0)
Platelets: 251 K/uL (ref 150.0–400.0)
RBC: 4.35 Mil/uL (ref 3.87–5.11)
RDW: 12.9 % (ref 11.5–15.5)
WBC: 7.3 K/uL (ref 4.0–10.5)

## 2024-07-09 LAB — BASIC METABOLIC PANEL WITH GFR
BUN: 13 mg/dL (ref 6–23)
CO2: 31 meq/L (ref 19–32)
Calcium: 9 mg/dL (ref 8.4–10.5)
Chloride: 102 meq/L (ref 96–112)
Creatinine, Ser: 0.71 mg/dL (ref 0.40–1.20)
GFR: 85.74 mL/min (ref >60.00)
Glucose, Bld: 110 mg/dL — ABNORMAL HIGH (ref 70–99)
Potassium: 4.2 meq/L (ref 3.5–5.1)
Sodium: 138 meq/L (ref 135–145)

## 2024-07-10 ENCOUNTER — Ambulatory Visit: Payer: Self-pay | Admitting: Sports Medicine

## 2024-07-14 ENCOUNTER — Other Ambulatory Visit: Payer: Self-pay | Admitting: Family Medicine

## 2024-07-14 DIAGNOSIS — E782 Mixed hyperlipidemia: Secondary | ICD-10-CM

## 2024-07-16 DIAGNOSIS — H353221 Exudative age-related macular degeneration, left eye, with active choroidal neovascularization: Secondary | ICD-10-CM | POA: Diagnosis not present

## 2024-07-28 ENCOUNTER — Telehealth: Payer: Self-pay

## 2024-07-28 NOTE — Telephone Encounter (Signed)
 Copied from CRM #[id number]. Topic: Clinical - Medical Advice >> Jul 28, 2024 11:51 AM Emylou G wrote: Reason for CRM: Patient called.. checking to see if we can offer an injection for her plantar fasciitis or does she need referral?  Her right heel.

## 2024-07-29 ENCOUNTER — Encounter: Payer: Self-pay | Admitting: Family Medicine

## 2024-07-30 ENCOUNTER — Telehealth: Payer: Self-pay | Admitting: *Deleted

## 2024-07-30 ENCOUNTER — Ambulatory Visit

## 2024-07-30 VITALS — BP 102/60 | Ht 60.0 in | Wt 108.0 lb

## 2024-07-30 DIAGNOSIS — G8929 Other chronic pain: Secondary | ICD-10-CM | POA: Diagnosis not present

## 2024-07-30 DIAGNOSIS — M722 Plantar fascial fibromatosis: Secondary | ICD-10-CM | POA: Diagnosis not present

## 2024-07-30 DIAGNOSIS — M25571 Pain in right ankle and joints of right foot: Secondary | ICD-10-CM

## 2024-07-30 MED ORDER — NAPROXEN 500 MG PO TABS: 500.0000 mg | ORAL_TABLET | Freq: Two times a day (BID) | ORAL | 1 refills | Status: AC

## 2024-07-30 NOTE — Telephone Encounter (Signed)
 Called Dr. Ermelinda office and spoke with Mrs. Alisa to inquire about these type of injections, as well as if the patient needs a new referral. Patient last saw Dr. ONEIDA for this issue in 2020. Alisa is going to call patient to get her scheduled.

## 2024-07-30 NOTE — Telephone Encounter (Signed)
-----   Message from Alisa DEL sent at 07/30/2024  3:39 PM EST ----- Regarding: Pt says Lives in [place] & want to go o [facility] Rehad @ [facility] Please send PT order to [facility] not to [facility] at patient request.  Pls & Thx U

## 2024-07-30 NOTE — Progress Notes (Signed)
   Subjective:    Patient ID: Isabel Tyler, female    DOB: 73 y.o., 02-03-1951   MRN: [id number]  Chief Complaint: Right heel pain  Discussed the use of AI scribe software for clinical note transcription with the patient, who gave verbal consent to proceed.  History of Present Illness Isabel Tyler is a 73 year old female who presents with right heel pain.  Right heel pain - Duration of approximately one month - Pain localized to the underside of the back of the right heel - Pain is most severe with first steps in the morning and after prolonged sitting - Associated with stiffness after sitting - Prior injection provided relief for 1 to 2 weeks before recurrence of pain - Home exercises with roller, rocker, frozen bottle, and shoe cushioning have not provided benefit - No use of oral analgesics or topical NSAIDs - Attends [employer]neakers three times weekly and walks her puppy daily - Puppy pulling during walks exacerbates symptoms  Lower extremity neuropathic symptoms - Intermittent sciatic-type pain throughout the day - Symptoms are more prominent when walking her dog - Believes symptoms are related to altered gait from heel pain  Cervical paresthesia - New tingling in the scalp when turning head to the left - Sensation described as gentle hair pulling - No symptoms with turning to the right or looking straight ahead  Review of pertinent imaging: 3 view plain film radiographs obtained of the right foot on 09/03/2018 per my independent evaluation revealing hallux valgus, Mild degenerative changes of the midfoot and first MTP.  No acute osseous abnormalities.    Objective:   There were no vitals filed for this visit.  Right Ankle ( compared to normal ) Inspection: No swelling.  Normal longitudinal arches without significant collapse.  Hallux valgus. Palpation: TTP - ATFL, - CFL, - PTFL, - anterior joint line, - navicular, - base of 5th metatarsal, - deltoid, -  tarsal, -  metatarsal, - achilles, + plantar fascia AROM/PROM: Full plantarflexion, dorsiflexion, eversion, inversion. FROM  of the great toe Strength: 5/5 plantarflexion, 5/5 dorsiflexion, 5/5 eversion, 5/5 inversion Special tests: - talar tilt test, - anterior drawer, - proximal squeeze, - calcaneal squeeze  Extracorporeal Shockwave Therapy Procedure Following the description of risks including pain, bruising, local skin irritation, damage to surrounding structures, patient provided verbal/written consent for ESWT procedure. Palpation was used to identify the right plantar fascia. Patient and probe was sterilely prepped in the usual fashion with alcohol.  Total strikes: 2000 Intensity: 20 Frequency: 12  Patient tolerated well without complication. Precautions provided.     Assessment & Plan:   Assessment & Plan Plantar fasciitis, right foot   Chronic plantar fasciitis in the right foot has persisted for about a month, with heel pain after rest. A previous steroid injection offered temporary relief, but stretching and exercise have been insufficient. Sciatic nerve irritation is considered in the differential diagnosis, likely due to altered gait. Initiated shockwave therapy today, to continue weekly for 3-5 sessions. Referred to physical therapy for exercises to strengthen foot muscles. Recommended orthotic inserts to reduce heel pressure. Prescribed Voltaren gel for inflammation and longer-lasting ibuprofen for pain during travel. Advised to use pain as a guide for activity, especially when walking the dog.

## 2024-07-30 NOTE — Telephone Encounter (Signed)
 New referral updated. See referrals.

## 2024-08-12 ENCOUNTER — Ambulatory Visit

## 2024-08-13 ENCOUNTER — Ambulatory Visit: Payer: Self-pay

## 2024-08-13 VITALS — Ht 60.0 in | Wt 108.0 lb

## 2024-08-13 DIAGNOSIS — M722 Plantar fascial fibromatosis: Secondary | ICD-10-CM

## 2024-08-13 NOTE — Progress Notes (Signed)
° °  Subjective:    Patient ID: Isabel Tyler, female    DOB: 73 y.o., 21-Nov-1950   MRN: [id number]  Chief Complaint: Right foot plantar fasciitis - shockwave treatment #1  Discussed the use of AI scribe software for clinical note transcription with the patient, who gave verbal consent to proceed.  History of Present Illness Isabel Tyler presents for shockwave treatment #2 for plantar fasciitis of her right foot. She received treatment #1 during her last visit with myself     Objective:   There were no vitals filed for this visit.  Extracorporeal Shockwave Therapy Procedure Following the description of risks including pain, bruising, local skin irritation, damage to surrounding structures, patient provided verbal/written consent for ESWT procedure. Palpation was used to identify the right plantar fascia. Patient and probe was sterilely prepped in the usual fashion with alcohol.  Total strikes: 2000 Intensity: 120 Frequency: 12  Patient tolerated well without complication. Precautions provided.      Assessment & Plan:   Assessment & Plan Isabel Tyler tolerated shockwave treatment #2 for her right sided plantar fasciitis well.  Recommend follow-up in 1-2 weeks for shockwave treatment #3.

## 2024-08-20 ENCOUNTER — Ambulatory Visit
Admission: EM | Admit: 2024-08-20 | Discharge: 2024-08-20 | Disposition: A | Discharge status: Home / Self Care | Attending: Family Medicine | Admitting: Family Medicine

## 2024-08-20 ENCOUNTER — Other Ambulatory Visit: Payer: Self-pay

## 2024-08-20 DIAGNOSIS — R5383 Other fatigue: Secondary | ICD-10-CM | POA: Diagnosis not present

## 2024-08-20 DIAGNOSIS — R6889 Other general symptoms and signs: Secondary | ICD-10-CM

## 2024-08-20 LAB — POC SOFIA SARS ANTIGEN FIA: SARS Coronavirus 2 Ag: NEGATIVE

## 2024-08-20 LAB — POCT INFLUENZA A/B
Influenza A, POC: NEGATIVE
Influenza B, POC: NEGATIVE

## 2024-08-20 LAB — POCT RAPID STREP A (OFFICE): Rapid Strep A Screen: NEGATIVE

## 2024-08-20 NOTE — ED Provider Notes (Signed)
 " [facility]    CSN: [id number] Arrival date & time: 08/20/24  0904      History   Chief Complaint No chief complaint on file.   HPI Isabel Tyler is a 73 y.o. female.   HPI Pleasant 73 year old female presents with fatigue for 2 weeks.  PMH significant for SVT, hepatic hemangioma, right lobe since January 2017, and IBS.  Past Medical History:  Diagnosis Date   Allergy 1976   tree pollen   Anxiety    Diverticular disease    GERD (gastroesophageal reflux disease)    Heart murmur mitralvalve prolapse   Hx of diverticulitis of colon 04/04/2017   Hyperlipidemia, mixed    Hypothyroidism 2000   Osteoporosis 0steoporosis   Vitamin D  deficiency     Patient Active Problem List   Diagnosis Date Noted   IBS (irritable bowel syndrome)- Diarrhea predominent 07/01/2024   GAD (generalized anxiety disorder) 05/15/2024   Supraventricular tachycardia 05/15/2024   Hypoglycemia 11/16/2023   Osteoporosis 10/07/2019   Rosacea 12/12/2018   Trigger thumb, left thumb 09/26/2018   Lumbar spinal stenosis 09/03/2018   Vitamin D  deficiency 06/06/2018   Abnormal glucose 06/06/2018   Palpitations 06/06/2018   Gastroesophageal reflux disease 04/04/2017   Hepatic hemangioma, Rt lobe (7 cm) Jan 2017 12/12/2016   Hyperlipidemia, mixed 10/23/2016   Hypothyroidism 10/23/2016   Major depressive disorder, recurrent, in full remission 10/23/2016   Diverticulosis of colon without hemorrhage 10/23/2016    Past Surgical History:  Procedure Laterality Date   BREAST BIOPSY Left    benign   CHOLECYSTECTOMY N/A 02/08/2017   Procedure: LAPAROSCOPIC CHOLECYSTECTOMY WITH INTRAOPERATIVE CHOLANGIOGRAM;  Surgeon: Belinda Cough, MD;  Location: [facility];  Service: General;  Laterality: N/A;   EYE SURGERY     Eyelid lifted   TONSILLECTOMY      OB History   No obstetric history on file.      Home Medications    Prior to Admission medications  Medication Sig Start Date End Date Taking?  Authorizing Provider  Ascorbic Acid (VITAMIN C) 1000 MG tablet Take 1,000 mg by mouth daily.    [provider]  aspirin EC 81 MG tablet Take 81 mg by mouth daily.    [provider]  Calcium  Citrate (CITRACAL PO) Take 1 tablet by mouth daily.    [provider]  Cholecalciferol (VITAMIN D -3) 5000 units TABS Take 1 tablet by mouth daily.    [provider]  Flaxseed, Linseed, (FLAXSEED OIL) 1200 MG CAPS Take 1 capsule by mouth daily.    [provider]  hyoscyamine  (LEVSIN ) 0.125 MG tablet Take 0.125 mg by mouth every 4 (four) hours as needed for cramping.    [provider]  levothyroxine  (SYNTHROID ) 75 MCG tablet Take 1 tablet BY MOUTH Daily on an Empty Stomach with only water for 30 minutes & No Antacid meds, Calcium  or Magnesium for 4 hours and AVOID Biotin 10/02/23   Cranford, Tonya, NP  Melatonin 5 MG/ML LIQD Take 5 mg by mouth at bedtime. PRN    [provider]  metoprolol  succinate (TOPROL -XL) 25 MG 24 hr tablet TAKE 1/2 TABLET(12.5 MG) BY MOUTH DAILY 04/09/24   Alvan Dorothyann BIRCH, MD  naproxen  (NAPROSYN ) 500 MG tablet Take 1 tablet (500 mg total) by mouth 2 (two) times daily with a meal. 07/30/24   Gottwalt, Redell A, DO  ondansetron  (ZOFRAN -ODT) 4 MG disintegrating tablet Take 1 tablet (4 mg total) by mouth every 8 (eight) hours as needed for  nausea or vomiting. 07/08/24   Sherlynn Madden, MD  Probiotic Product (PROBIOTIC-10) CAPS Take 1 capsule by mouth daily.    [provider]  rosuvastatin  (CRESTOR ) 20 MG tablet TAKE ONE TABLET BY MOUTH THREE TIMES A WEEK FOR CHOLESTEROL 07/16/24   Alvan Dorothyann BIRCH, MD  sertraline  (ZOLOFT ) 50 MG tablet Take 1 tablet (50 mg total) by mouth daily. Patient taking differently: Take 100 mg by mouth daily. Pt takes 100 mg 06/27/24   Alvan Dorothyann BIRCH, MD  tobramycin-dexamethasone  [facility]) ophthalmic solution Place 1 drop into both eyes as needed. 08/31/23   [provider]    Family History Family History  Problem Relation Age of Onset   Heart disease Mother    Heart disease Father    Breast cancer Sister        around age 21   Stomach cancer Neg Hx    Colon cancer Neg Hx    Esophageal cancer Neg Hx    Pancreatic cancer Neg Hx    Rectal cancer Neg Hx     Social History Social History[1]   Allergies   Ciprofloxacin  and Z-pak [azithromycin ]   Review of Systems Review of Systems  Constitutional:  Positive for fatigue.  All other systems reviewed and are negative.    Physical Exam Triage Vital Signs ED Triage Vitals  Encounter Vitals Group     BP      Girls Systolic BP Percentile      Girls Diastolic BP Percentile      Boys Systolic BP Percentile      Boys Diastolic BP Percentile      Pulse      Resp      Temp      Temp src      SpO2      Weight      Height      Head Circumference      Peak Flow      Pain Score      Pain Loc      Pain Education      Exclude from Growth Chart    No data found.  Updated Vital Signs There were no vitals taken for this visit.  Physical Exam Vitals and nursing note reviewed.  Constitutional:      Appearance: Normal appearance. She is normal weight.  HENT:     Head: Normocephalic and atraumatic.     Right Ear: Tympanic membrane, ear canal and external ear normal.     Left Ear: Tympanic membrane, ear canal and external ear normal.     Mouth/Throat:     Mouth: Mucous membranes are moist.     Pharynx: Oropharynx is clear.  Eyes:     Extraocular Movements: Extraocular movements intact.     Conjunctiva/sclera: Conjunctivae normal.     Pupils: Pupils are equal, round, and reactive to light.  Cardiovascular:     Rate and Rhythm: Normal rate and regular rhythm.     Heart sounds: Normal heart sounds.  Pulmonary:     Effort: Pulmonary effort is normal.     Breath sounds: Normal breath sounds. No wheezing, rhonchi or rales.  Musculoskeletal:        General: Normal range of  motion.  Skin:    General: Skin is warm and dry.  Neurological:     General: No focal deficit present.     Mental Status: She is alert and oriented to person, place, and time. Mental status is at baseline.  Psychiatric:  Mood and Affect: Mood normal.        Behavior: Behavior normal.      UC Treatments / Results  Labs (all labs ordered are listed, but only abnormal results are displayed) Labs Reviewed - No data to display  EKG   Radiology No results found.  Procedures Procedures (including critical care time)  Medications Ordered in UC Medications - No data to display  Initial Impression / Assessment and Plan / UC Course  I have reviewed the triage vital signs and the nursing notes.  Pertinent labs & imaging results that were available during my care of the patient were reviewed by me and considered in my medical decision making (see chart for details).     MDM: 1.  Fatigue-Advised patient all test were negative today (rapid strep, COVID-19, and influenza).  Encouraged to increase daily water intake to 32 ounces per day 7 days/week.  Advised if symptoms worsen and/or unresolved please follow-up with your PCP for further evaluation.  Patient discharged home, hemodynamically stable. Final Clinical Impressions(s) / UC Diagnoses   Final diagnoses:  None   Discharge Instructions   None    ED Prescriptions   None    PDMP not reviewed this encounter.    [1]  Social History Tobacco Use   Smoking status: Never   Smokeless tobacco: Never  Vaping Use   Vaping status: Never Used  Substance Use Topics   Alcohol use: Yes    Comment: occasional    Drug use: No     Teddy Sharper, FNP 08/20/24 1001  "

## 2024-08-20 NOTE — Discharge Instructions (Addendum)
 Advised patient all test were negative today (rapid strep, COVID-19, and influenza).  Encouraged to increase daily water intake to 32 ounces per day 7 days/week.  Advised if symptoms worsen and/or unresolved please follow-up with your PCP for further evaluation.

## 2024-08-20 NOTE — ED Triage Notes (Signed)
 Pt presenting with c/o fatigue x 2 weeks. Pt denies any other symptoms. No medication taken for symptoms.

## 2024-09-01 ENCOUNTER — Ambulatory Visit

## 2024-09-02 ENCOUNTER — Ambulatory Visit: Attending: Family Medicine | Admitting: Physical Therapy

## 2024-09-02 ENCOUNTER — Encounter: Payer: Self-pay | Admitting: Physical Therapy

## 2024-09-02 ENCOUNTER — Other Ambulatory Visit: Payer: Self-pay

## 2024-09-02 DIAGNOSIS — R262 Difficulty in walking, not elsewhere classified: Secondary | ICD-10-CM | POA: Diagnosis not present

## 2024-09-02 DIAGNOSIS — M722 Plantar fascial fibromatosis: Secondary | ICD-10-CM | POA: Diagnosis present

## 2024-09-02 NOTE — Therapy (Signed)
 " OUTPATIENT PHYSICAL THERAPY LOWER EXTREMITY EVALUATION   Patient Name: Isabel Tyler MRN: [id number] DOB:1950-11-04, 74 y.o., female Today's Date: 09/02/2024  END OF SESSION:  PT End of Session - 09/02/24 1353     Visit Number 1    Number of Visits 16    Date for Recertification  10/28/24    Authorization Type Medicare A and B    Progress Note Due on Visit 10    PT Start Time 1015    PT Stop Time 1051    PT Time Calculation (min) 36 min    Activity Tolerance Patient tolerated treatment well    Behavior During Therapy WFL for tasks assessed/performed          Past Medical History:  Diagnosis Date   Allergy 1976   tree pollen   Anxiety    Diverticular disease    GERD (gastroesophageal reflux disease)    Heart murmur mitralvalve prolapse   Hx of diverticulitis of colon 04/04/2017   Hyperlipidemia, mixed    Hypothyroidism 2000   Osteoporosis 0steoporosis   Vitamin D  deficiency    Past Surgical History:  Procedure Laterality Date   BREAST BIOPSY Left    benign   CHOLECYSTECTOMY N/A 02/08/2017   Procedure: LAPAROSCOPIC CHOLECYSTECTOMY WITH INTRAOPERATIVE CHOLANGIOGRAM;  Surgeon: Belinda Cough, MD;  Location: [facility];  Service: General;  Laterality: N/A;   EYE SURGERY     Eyelid lifted   TONSILLECTOMY     Patient Active Problem List   Diagnosis Date Noted   IBS (irritable bowel syndrome)- Diarrhea predominent 07/01/2024   GAD (generalized anxiety disorder) 05/15/2024   Supraventricular tachycardia 05/15/2024   Hypoglycemia 11/16/2023   Osteoporosis 10/07/2019   Rosacea 12/12/2018   Trigger thumb, left thumb 09/26/2018   Lumbar spinal stenosis 09/03/2018   Vitamin D  deficiency 06/06/2018   Abnormal glucose 06/06/2018   Palpitations 06/06/2018   Gastroesophageal reflux disease 04/04/2017   Hepatic hemangioma, Rt lobe (7 cm) Jan 2017 12/12/2016   Hyperlipidemia, mixed 10/23/2016   Hypothyroidism 10/23/2016   Major depressive disorder, recurrent, in full  remission 10/23/2016   Diverticulosis of colon without hemorrhage 10/23/2016    PCP: Alvan  REFERRING PROVIDER: Gottwalt  REFERRING DIAG: plantar fasciitis  THERAPY DIAG:  Plantar fasciitis of right foot  Difficulty in walking, not elsewhere classified  Rationale for Evaluation and Treatment: Rehabilitation  ONSET DATE: 06/2024  SUBJECTIVE:   SUBJECTIVE STATEMENT: Pt states she has Rt plantar fasciitis and Lt hip pain. She had shockwave treatment for Rt foot but states it did not help. She states MD gave her exercises but she has not been able to do them consistently. She does silver sneakers classes and also walks 1 mile per day with her dog. She states she can tolerate walking 1 mile but after the walk she feels like she has nails in my foot and burning. She states she has pain all the time but it gets a lot worse after walking. She has tried off the shelf inserts and custom made inserts but states both of these increased pain. She states pain decreases a little with use of ice but she has not found a long lasting solution to pain  PERTINENT HISTORY: History of plantar fasciitis, osteoporosis PAIN:  Are you having pain? Yes: NPRS scale: 3/10 currently 10/10 at worst Pain location: Rt heel Pain description: burning, stabbing Aggravating factors: walk Relieving factors: ice  PRECAUTIONS: Other: osteoporosis  RED FLAGS: None   WEIGHT BEARING RESTRICTIONS: No  FALLS:  Has patient fallen in last 6 months? No    OCCUPATION: retired - enjoys silver sneakers classes, walking her dog  PLOF: Independent  PATIENT GOALS: decrease pain quickly  NEXT MD VISIT: PRN  OBJECTIVE:  Note: Objective measures were completed at Evaluation unless otherwise noted.  DIAGNOSTIC FINDINGS: no recent imaging on file  PATIENT SURVEYS:  PSFS: THE PATIENT SPECIFIC FUNCTIONAL SCALE  Place score of 0-10 (0 = unable to perform activity and 10 = able to perform activity at the same  level as before injury or problem)  Activity Date: 09/02/24    Walking x 1 mile 5    2.go up on my toes 0    3.     4.      Average Score 2.5      Total Score = Sum of activity scores/number of activities  Minimally Detectable Change: 3 points (for single activity); 2 points (for average score)  [facility] (nd). The Patient Specific Functional Scale . Retrieved from [email or web address]   COGNITION: Overall cognitive status: Within functional limits for tasks assessed      PALPATION: Decreased toe flexion and extension actively TTP and light touch plantar surface of Rt foot from heel to toes  LOWER EXTREMITY ROM:  Active ROM Right eval Left eval  Hip flexion    Hip extension    Hip abduction    Hip adduction    Hip internal rotation    Hip external rotation    Knee flexion    Knee extension    Ankle dorsiflexion 10 15  Ankle plantarflexion 61 65  Ankle inversion 30 30  Ankle eversion 24 25   (Blank rows = not tested)  LOWER EXTREMITY MMT:  MMT Right eval Left eval  Hip flexion    Hip extension    Hip abduction    Hip adduction    Hip internal rotation    Hip external rotation    Knee flexion    Knee extension    Ankle dorsiflexion 3+ 4+  Ankle plantarflexion 3- 4+  Ankle inversion 4- 4+  Ankle eversion 3- 4+   (Blank rows = not tested)    FUNCTIONAL TESTS:   SLS: Rt 10 seconds - pain!, Lt 4 seconds  GAIT: Distance walked: 110' Assistive device utilized: None Level of assistance: Complete Independence Comments: no  heel strike on Rt, decreased Rt toe off                                                                                                                                TREATMENT DATE: 09/02/24 See HEP Pt educated on PT POC and goals, HEP, rationale for treatment, activity modifications    PATIENT EDUCATION:  Education details: PT POC and goals, HEP Person educated:  Patient Education method: Explanation, Demonstration, and Handouts Education comprehension: verbalized understanding and returned demonstration  HOME EXERCISE PROGRAM: Access Code: [email or web address] URL: [email or web address] Date: 09/02/2024 Prepared by:  Darice Conine  Exercises - Plantar Fascia Stretch on Step  - 1 x daily - 7 x weekly - 1 sets - 10 reps - 10 seconds hold - Towel Scrunches  - 1 x daily - 7 x weekly - 3 sets - 10 reps - Toe Spreading  - 1 x daily - 7 x weekly - 2 sets - 10 reps - Seated Great Toe Extension  - 1 x daily - 7 x weekly - 2 sets - 10 reps  ASSESSMENT:  CLINICAL IMPRESSION: Patient is a 74 y.o. female who was seen today for physical therapy evaluation and treatment for Right plantar fasciitis. She presents with decreased strength and ROM in Rt foot and ankle, decreased activity tolerance, impaired gait and decreased functional mobility. She will benefit from skilled PT to address deficits and improve mobility with decreased pain.   OBJECTIVE IMPAIRMENTS: Abnormal gait, decreased activity tolerance, decreased balance, difficulty walking, decreased ROM, decreased strength, and pain.   ACTIVITY LIMITATIONS: locomotion level  PARTICIPATION LIMITATIONS: community activity  PERSONAL FACTORS: Past/current experiences and Time since onset of injury/illness/exacerbation are also affecting patient's functional outcome.   REHAB POTENTIAL: Good  CLINICAL DECISION MAKING: Evolving/moderate complexity  EVALUATION COMPLEXITY: Moderate   GOALS: Goals reviewed with patient? Yes  SHORT TERM GOALS: Target date: 09/30/2024   Pt will be independent with initial HEP Baseline: Goal status: INITIAL  2.  Pt will report <= 6/10 pain at worst Baseline: 10/10 Goal status: INITIAL    LONG TERM GOALS: Target date: 10/28/2024    Pt will be independent in advanced HEP including return to walking and full silver sneakers routine Baseline:  Goal status:  INITIAL  2.  Pt will improve Rt ankle strength to >= 4/5 to improve tolerance to walking Baseline:  Goal status: INITIAL  3.  Pt will improve PSFS average score to >= 5.5 to demo improved functional mobility Baseline:  Goal status: INITIAL  4.  Pt will tolerate SLS on Rt LE x 20 seconds without increase in pain Baseline:  Goal status: INITIAL    PLAN:  PT FREQUENCY: 1-2x/week  PT DURATION: 8 weeks  PLANNED INTERVENTIONS: 97164- PT Re-evaluation, 97110-Therapeutic exercises, 97530- Therapeutic activity, 97112- Neuromuscular re-education, 97535- Self Care, [id number]- Manual therapy, [id number]- Gait training, [phone]- Aquatic Therapy, [phone]- Electrical stimulation (unattended), [id number]- Ultrasound, [id number]- Ionotophoresis 4mg /ml Dexamethasone , 79439 (1-2 muscles), 20561 (3+ muscles)- Dry Needling, Patient/Family education, Balance training, Stair training, Taping, Cryotherapy, and Moist heat  PLAN FOR NEXT SESSION: taping? Assess response to HEP, toe stretching and strengthening   Eylin Pontarelli, PT 09/02/2024, 1:54 PM  "

## 2024-09-04 ENCOUNTER — Ambulatory Visit: Admitting: Rehabilitative and Restorative Service Providers"

## 2024-09-04 ENCOUNTER — Encounter: Payer: Self-pay | Admitting: Rehabilitative and Restorative Service Providers"

## 2024-09-04 ENCOUNTER — Ambulatory Visit: Admitting: Family Medicine

## 2024-09-04 DIAGNOSIS — M722 Plantar fascial fibromatosis: Secondary | ICD-10-CM

## 2024-09-04 DIAGNOSIS — R262 Difficulty in walking, not elsewhere classified: Secondary | ICD-10-CM

## 2024-09-04 NOTE — Therapy (Signed)
 " OUTPATIENT PHYSICAL THERAPY LOWER EXTREMITY EVALUATION   Patient Name: Isabel Tyler MRN: [id number] DOB:11/14/1950, 74 y.o., female Today's Date: 09/04/2024  END OF SESSION:  PT End of Session - 09/04/24 0918     Visit Number 2    Number of Visits 16    Date for Recertification  10/28/24    Authorization Type Medicare A and B    Progress Note Due on Visit 10    PT Start Time 0919    PT Stop Time 1005    PT Time Calculation (min) 46 min    Activity Tolerance Patient tolerated treatment well          Past Medical History:  Diagnosis Date   Allergy 1976   tree pollen   Anxiety    Diverticular disease    GERD (gastroesophageal reflux disease)    Heart murmur mitralvalve prolapse   Hx of diverticulitis of colon 04/04/2017   Hyperlipidemia, mixed    Hypothyroidism 2000   Osteoporosis 0steoporosis   Vitamin D  deficiency    Past Surgical History:  Procedure Laterality Date   BREAST BIOPSY Left    benign   CHOLECYSTECTOMY N/A 02/08/2017   Procedure: LAPAROSCOPIC CHOLECYSTECTOMY WITH INTRAOPERATIVE CHOLANGIOGRAM;  Surgeon: Belinda Cough, MD;  Location: [facility];  Service: General;  Laterality: N/A;   EYE SURGERY     Eyelid lifted   TONSILLECTOMY     Patient Active Problem List   Diagnosis Date Noted   IBS (irritable bowel syndrome)- Diarrhea predominent 07/01/2024   GAD (generalized anxiety disorder) 05/15/2024   Supraventricular tachycardia 05/15/2024   Hypoglycemia 11/16/2023   Osteoporosis 10/07/2019   Rosacea 12/12/2018   Trigger thumb, left thumb 09/26/2018   Lumbar spinal stenosis 09/03/2018   Vitamin D  deficiency 06/06/2018   Abnormal glucose 06/06/2018   Palpitations 06/06/2018   Gastroesophageal reflux disease 04/04/2017   Hepatic hemangioma, Rt lobe (7 cm) Jan 2017 12/12/2016   Hyperlipidemia, mixed 10/23/2016   Hypothyroidism 10/23/2016   Major depressive disorder, recurrent, in full remission 10/23/2016   Diverticulosis of colon without hemorrhage  10/23/2016    PCP: Alvan  REFERRING PROVIDER: Gottwalt  REFERRING DIAG: plantar fasciitis  THERAPY DIAG:  Plantar fasciitis of right foot  Difficulty in walking, not elsewhere classified  Rationale for Evaluation and Treatment: Rehabilitation  ONSET DATE: 06/2024  SUBJECTIVE:   SUBJECTIVE STATEMENT: Patient reports that she has been doing exercises. She has pain with the one standing on a step. Walking gives her the most grief   Eval: Pt states she has Rt plantar fasciitis and Lt hip pain. She had shockwave treatment for Rt foot but states it did not help. She states MD gave her exercises but she has not been able to do them consistently. She does silver sneakers classes and also walks 1 mile per day with her dog. She states she can tolerate walking 1 mile but after the walk she feels like she has nails in my foot and burning. She states she has pain all the time but it gets a lot worse after walking. She has tried off the shelf inserts and custom made inserts but states both of these increased pain. She states pain decreases a little with use of ice but she has not found a long lasting solution to pain  PERTINENT HISTORY: History of plantar fasciitis, osteoporosis PAIN:  Are you having pain? Yes: NPRS scale: 8/10 currently 10/10 at worst Pain location: Rt heel Pain description: burning, stabbing Aggravating factors: walk Relieving factors: ice  PRECAUTIONS: Other: osteoporosis   WEIGHT BEARING RESTRICTIONS: No  FALLS:  Has patient fallen in last 6 months? No    OCCUPATION: retired - enjoys silver sneakers classes, walking her dog   PATIENT GOALS: decrease pain quickly  NEXT MD VISIT: PRN  OBJECTIVE:  Note: Objective measures were completed at Evaluation unless otherwise noted.  DIAGNOSTIC FINDINGS: no recent imaging on file  PATIENT SURVEYS:  PSFS: THE PATIENT SPECIFIC FUNCTIONAL SCALE  Place score of 0-10 (0 = unable to perform activity and 10 =  able to perform activity at the same level as before injury or problem)  Activity Date: 09/02/24    Walking x 1 mile 5    2.go up on my toes 0    3.     4.      Average Score 2.5      Total Score = Sum of activity scores/number of activities  Minimally Detectable Change: 3 points (for single activity); 2 points (for average score)  [employer] (nd). The Patient Specific Functional Scale . Retrieved from [email or web address]    PALPATION: Decreased toe flexion and extension actively TTP and light touch plantar surface of Rt foot from heel to toes  LOWER EXTREMITY ROM:  Active ROM Right eval Left eval  Hip flexion    Hip extension    Hip abduction    Hip adduction    Hip internal rotation    Hip external rotation    Knee flexion    Knee extension    Ankle dorsiflexion 10 15  Ankle plantarflexion 61 65  Ankle inversion 30 30  Ankle eversion 24 25   (Blank rows = not tested)  LOWER EXTREMITY MMT:  MMT Right eval Left eval  Hip flexion    Hip extension    Hip abduction    Hip adduction    Hip internal rotation    Hip external rotation    Knee flexion    Knee extension    Ankle dorsiflexion 3+ 4+  Ankle plantarflexion 3- 4+  Ankle inversion 4- 4+  Ankle eversion 3- 4+   (Blank rows = not tested)    FUNCTIONAL TESTS:   SLS: Rt 10 seconds - pain!, Lt 4 seconds  GAIT: Distance walked: 110' Assistive device utilized: None Level of assistance: Complete Independence Comments: no  heel strike on Rt, decreased Rt toe off  [facility] Adult PT Treatment:                                                DATE: 09/04/24 Therapeutic Exercise: Supine  Piriformis stretch travell 30 sec x 2  Hamstring stretch with strap 30 sec x 1 ITB stretch 30 sec x 3   Manual Therapy: Iliacus release R/L 60 sec  TPR psoas R/L Myofacial release R LE  Mobs R rear and forefoot  PROM through toes  Neuromuscular re-ed:  (suggestions to prepare tissue prior to walking)  Sitting feet flat on floor patient pressing through the knees to add gradual wt LE's through heels  Standing weight bearing bilat LE's  Therapeutic Activity: Gastroc stretch at chair 20 sec x 2 Soleus stretch at chair 20 sec x 2  Self Care: Added heel lift bilat shoes ~ 2/8's to 3/8's inch with patient gel inserts  Discussed preparing for walking through sitting and standing weight bearing Ascend and descend  steps with flat feet and not on bals of feet                                                                                                                                TREATMENT DATE: 09/02/24 See HEP Pt educated on PT POC and goals, HEP, rationale for treatment, activity modifications    PATIENT EDUCATION:  Education details: PT POC and goals, HEP Person educated: Patient Education method: Explanation, Demonstration, and Handouts Education comprehension: verbalized understanding and returned demonstration  HOME EXERCISE PROGRAM: Access Code: [email or web address] URL: [email or web address] Date: 09/04/2024 Prepared by: Tristian Bouska  Exercises - Towel Scrunches  - 1 x daily - 7 x weekly - 3 sets - 10 reps - Toe Spreading  - 1 x daily - 7 x weekly - 2 sets - 10 reps - Seated Great Toe Extension  - 1 x daily - 7 x weekly - 2 sets - 10 reps - Supine Piriformis Stretch with Leg Straight  - 2 x daily - 7 x weekly - 1 sets - 3 reps - 30 sec  hold - Hooklying Hamstring Stretch with Strap  - 1 x daily - 7 x weekly - 1 sets - 3 reps - 30 sec  hold - Supine ITB Stretch with Strap  - 1 x daily - 7 x weekly - 1 sets - 3 reps - 30 sec  hold - Standing Gastroc Stretch  - 2 x daily - 7 x weekly - 1 sets - 3 reps - 20-30 sec  hold - Standing Gastroc Stretch  - 2 x daily - 7 x weekly - 1 sets - 3 reps - 20-30 sec  hold - Standing Piriformis Release with Ball at Wall  - 2 x daily - 7 x weekly - 30-60 sec  hold  ASSESSMENT:  CLINICAL  IMPRESSION: Patient returns reporting increased pain in the foot from plantar fascia stretch on step which increased pain. Replaced with gastroc and soleus stretches. Note tightness in bilat hip flexors and L posterior lateral hip. Added myofacial release work through bilat hips and R LE; stretches for hips; mobilization of R foot and ankle. Trial of heel lifts bilat shoes to lift heel ~ 3/5 inch. Patient felt better with standing and walking post treatment and with heel lifts. Pain at 1-2/10 post treatment.   Eval: Patient is a 74 y.o. female who was seen today for physical therapy evaluation and treatment for Right plantar fasciitis. She presents with decreased strength and ROM in Rt foot and ankle, decreased activity tolerance, impaired gait and decreased functional mobility. She will benefit from skilled PT to address deficits and improve mobility with decreased pain.   OBJECTIVE IMPAIRMENTS: Abnormal gait, decreased activity tolerance, decreased balance, difficulty walking, decreased ROM, decreased strength, and pain.     GOALS: Goals reviewed with patient? Yes  SHORT TERM GOALS: Target date: 09/30/2024   Pt will be independent with initial HEP Baseline:  Goal status: INITIAL  2.  Pt will report <= 6/10 pain at worst Baseline: 10/10 Goal status: INITIAL    LONG TERM GOALS: Target date: 10/28/2024    Pt will be independent in advanced HEP including return to walking and full silver sneakers routine Baseline:  Goal status: INITIAL  2.  Pt will improve Rt ankle strength to >= 4/5 to improve tolerance to walking Baseline:  Goal status: INITIAL  3.  Pt will improve PSFS average score to >= 5.5 to demo improved functional mobility Baseline:  Goal status: INITIAL  4.  Pt will tolerate SLS on Rt LE x 20 seconds without increase in pain Baseline:  Goal status: INITIAL    PLAN:  PT FREQUENCY: 1-2x/week  PT DURATION: 8 weeks  PLANNED INTERVENTIONS: 97164- PT Re-evaluation,  97110-Therapeutic exercises, 97530- Therapeutic activity, 97112- Neuromuscular re-education, 97535- Self Care, 02859- Manual therapy, [id number]- Gait training, [phone]- Aquatic Therapy, [phone]- Electrical stimulation (unattended), [id number]- Ultrasound, [id number]- Ionotophoresis 4mg /ml Dexamethasone , 79439 (1-2 muscles), [id number] (3+ muscles)- Dry Needling, Patient/Family education, Balance training, Stair training, Taping, Cryotherapy, and Moist heat  PLAN FOR NEXT SESSION: taping? Assess response to HEP, toe stretching and strengthening   Maleeya Peterkin SHAUNNA Baptist, PT 09/04/2024, 9:19 AM  "

## 2024-09-08 LAB — HM MAMMOGRAPHY

## 2024-09-09 ENCOUNTER — Encounter: Payer: Self-pay | Admitting: Family Medicine

## 2024-09-09 ENCOUNTER — Encounter: Payer: Self-pay | Admitting: Rehabilitative and Restorative Service Providers"

## 2024-09-09 ENCOUNTER — Ambulatory Visit: Admitting: Rehabilitative and Restorative Service Providers"

## 2024-09-09 ENCOUNTER — Ambulatory Visit: Admitting: Family Medicine

## 2024-09-09 ENCOUNTER — Ambulatory Visit: Admitting: Physical Therapy

## 2024-09-09 VITALS — BP 107/43 | HR 74 | Ht 60.0 in | Wt 107.5 lb

## 2024-09-09 DIAGNOSIS — F411 Generalized anxiety disorder: Secondary | ICD-10-CM

## 2024-09-09 DIAGNOSIS — R140 Abdominal distension (gaseous): Secondary | ICD-10-CM | POA: Diagnosis not present

## 2024-09-09 DIAGNOSIS — M722 Plantar fascial fibromatosis: Secondary | ICD-10-CM

## 2024-09-09 DIAGNOSIS — F3342 Major depressive disorder, recurrent, in full remission: Secondary | ICD-10-CM

## 2024-09-09 DIAGNOSIS — H00012 Hordeolum externum right lower eyelid: Secondary | ICD-10-CM | POA: Diagnosis not present

## 2024-09-09 DIAGNOSIS — R262 Difficulty in walking, not elsewhere classified: Secondary | ICD-10-CM

## 2024-09-09 MED ORDER — ERYTHROMYCIN 5 MG/GM OP OINT: 1.0000 | TOPICAL_OINTMENT | Freq: Three times a day (TID) | OPHTHALMIC | 0 refills | Status: AC

## 2024-09-09 MED ORDER — SERTRALINE HCL 25 MG PO TABS: ORAL_TABLET | ORAL | 0 refills | Status: AC

## 2024-09-09 MED ORDER — VENLAFAXINE HCL ER 37.5 MG PO CP24: ORAL_CAPSULE | ORAL | 1 refills | Status: AC

## 2024-09-09 NOTE — Progress Notes (Signed)
 "  Established Patient Office Visit  Patient ID: Isabel Tyler, female    DOB: 1951/03/08  Age: 74 y.o. MRN: [id number] PCP: Isabel Isabel BIRCH, MD  Chief Complaint  Patient presents with   General anxiety disorder    Patient states that  Zoloft  not helping symptoms- she is out of this medication today but is wanting to discuss possibly  changing to Venlafaxine  instead.  Patient states when awakens in morning feels that stomach is unsettled  wondering If related to Anxiety.  Of note: patient just had Mamm[facility] done [facility] [place].    Subjective:     HPI  Discussed the use of AI scribe software for clinical note transcription with the patient, who gave verbal consent to proceed.  History of Present Illness Isabel Tyler is a 74 year old female who presents with mood disturbances and a sty in the right eye.  Right eye sty (hordeolum) - Sty on right lower eyelid began approximately one week ago - Severe at onset, with a white head on the lower lid and a sore on the inside of the lid - Severity has reduced but remains erythematous - History of recurrent styes, last episode six months ago - Regular eyelid hygiene with hot compresses and baby shampoo  Mood disturbances and emotional lability - Mood disturbances since December, characterized by sudden tearfulness and emotional lability - Feels overwhelmed by minor stressors and frequently on the verge of tears - Currently taking sertraline  50 mg, last dose today, refill available - No prior use of venlafaxine , considering trial based on spouse's positive experience  Gastrointestinal symptoms (gas and bloating) - Excessive gas and bloating, attributed to anxiety - Gas causes sensation of fullness and difficulty breathing, leading to panic - Relief with Gas-X (simethicone  125 mg) once daily - Negative testing for celiac disease and H. pylori - No identified food triggers - Long-term use of  probiotics - would like to try a papaya enzyme.      ROS    Objective:     BP (!) 107/43   Pulse 74   Ht 5' (1.524 m)   Wt 107 lb 8 oz (48.8 kg)   SpO2 99%   BMI 20.99 kg/m    Physical Exam Vitals and nursing note reviewed.  Constitutional:      Appearance: Normal appearance.  HENT:     Head: Normocephalic and atraumatic.     Comments: Right lower eyelid is erythematous and slightly swollen.   Eyes:     Conjunctiva/sclera: Conjunctivae normal.  Cardiovascular:     Rate and Rhythm: Normal rate and regular rhythm.  Pulmonary:     Effort: Pulmonary effort is normal.     Breath sounds: Normal breath sounds.  Skin:    General: Skin is warm and dry.  Neurological:     Mental Status: She is alert.  Psychiatric:        Mood and Affect: Mood normal.      No results found for any visits on 09/09/24.    The 10-year ASCVD risk score (Arnett DK, et al., 2019) is: 7.1%    Assessment & Plan:   Problem List Items Addressed This Visit       Other   Major depressive disorder, recurrent, in full remission   Relevant Medications   sertraline  (ZOLOFT ) 25 MG tablet   venlafaxine  XR (EFFEXOR  XR) 37.5 MG 24 hr capsule   GAD (generalized anxiety disorder)   Generalized anxiety disorder  and depression Increased tearfulness and emotional lability. Transitioning from sertraline  to venlafaxine  due to persistent symptoms. Discussed transition process and potential for increased emotional lability during tapering. - Prescribed sertraline  25 mg for tapering. - Instructed to taper sertraline  to 25 mg for 6 days, then half of 25 mg for 6 days while starting venlafaxine . - Prescribed venlafaxine  for 30 days. - Scheduled follow-up in 4 weeks to assess response to venlafaxine  and adjust dosage if necessary.      Relevant Medications   sertraline  (ZOLOFT ) 25 MG tablet   venlafaxine  XR (EFFEXOR  XR) 37.5 MG 24 hr capsule   Bloating symptom   Bloating/Belching Chronic bloating and  gas managed with simethicone . Discussed potential food triggers and adjunctive therapies. - Continue simethicone  125 mg daily. - Consider trying digestive enzyme supplement with peppermint oil, monitor for heartburn. - Continue probiotic supplementation.      Other Visit Diagnoses       Hordeolum externum of right lower eyelid    -  Primary   Relevant Medications   erythromycin  ophthalmic ointment       Assessment and Plan Assessment & Plan Internal hordeolum (sty) of eyelid Severe sty on the right eyelid with improvement noted. Continued use of hot compresses and eyelid hygiene. - Continue hot compresses and eyelid hygiene with baby shampoo. - Prescribed erythromycin  ophthalmic ointment for one week. - If no improvement after one week, discontinue ointment.   Plantar fasciitis of right foot Chronic plantar fasciitis in the right foot.  General health maintenance Mammogram completed. Reviewed blood work results.    Return in about 6 weeks (around 10/21/2024) for Mood.    Isabel Byars, MD [facility]   "

## 2024-09-09 NOTE — Assessment & Plan Note (Signed)
 Generalized anxiety disorder and depression Increased tearfulness and emotional lability. Transitioning from sertraline  to venlafaxine  due to persistent symptoms. Discussed transition process and potential for increased emotional lability during tapering. - Prescribed sertraline  25 mg for tapering. - Instructed to taper sertraline  to 25 mg for 6 days, then half of 25 mg for 6 days while starting venlafaxine . - Prescribed venlafaxine  for 30 days. - Scheduled follow-up in 4 weeks to assess response to venlafaxine  and adjust dosage if necessary.

## 2024-09-09 NOTE — Patient Instructions (Signed)
 Decrease Zoloft  to 25mg  daily for 6 days, and then 1/2 tab daily for 6 days When you start the half a tab you can start the venlafaxine . You will take both for 6 days and then stop the 1/2 of sertraline  completely.

## 2024-09-09 NOTE — Therapy (Signed)
 " OUTPATIENT PHYSICAL THERAPY LOWER EXTREMITY TREATMENT   Patient Name: Isabel Tyler MRN: [id number] DOB:November 29, 1950, 74 y.o., female Today's Date: 09/09/2024  END OF SESSION:  PT End of Session - 09/09/24 1146     Visit Number 3    Number of Visits 16    Date for Recertification  10/28/24    Authorization Type Medicare A and B    Progress Note Due on Visit 10    PT Start Time 1145    PT Stop Time 1230    PT Time Calculation (min) 45 min    Activity Tolerance Patient tolerated treatment well          Past Medical History:  Diagnosis Date   Allergy 1976   tree pollen   Anxiety    Diverticular disease    GERD (gastroesophageal reflux disease)    Heart murmur mitralvalve prolapse   Hx of diverticulitis of colon 04/04/2017   Hyperlipidemia, mixed    Hypothyroidism 2000   Osteoporosis 0steoporosis   Vitamin D  deficiency    Past Surgical History:  Procedure Laterality Date   BREAST BIOPSY Left    benign   CHOLECYSTECTOMY N/A 02/08/2017   Procedure: LAPAROSCOPIC CHOLECYSTECTOMY WITH INTRAOPERATIVE CHOLANGIOGRAM;  Surgeon: Belinda Cough, MD;  Location: [facility];  Service: General;  Laterality: N/A;   EYE SURGERY     Eyelid lifted   TONSILLECTOMY     Patient Active Problem List   Diagnosis Date Noted   IBS (irritable bowel syndrome)- Diarrhea predominent 07/01/2024   GAD (generalized anxiety disorder) 05/15/2024   Supraventricular tachycardia 05/15/2024   Hypoglycemia 11/16/2023   Osteoporosis 10/07/2019   Rosacea 12/12/2018   Trigger thumb, left thumb 09/26/2018   Lumbar spinal stenosis 09/03/2018   Vitamin D  deficiency 06/06/2018   Abnormal glucose 06/06/2018   Palpitations 06/06/2018   Gastroesophageal reflux disease 04/04/2017   Hepatic hemangioma, Rt lobe (7 cm) Jan 2017 12/12/2016   Hyperlipidemia, mixed 10/23/2016   Hypothyroidism 10/23/2016   Major depressive disorder, recurrent, in full remission 10/23/2016   Diverticulosis of colon without hemorrhage  10/23/2016    PCP: Alvan  REFERRING PROVIDER: Gottwalt  REFERRING DIAG: plantar fasciitis  THERAPY DIAG:  Plantar fasciitis of right foot  Difficulty in walking, not elsewhere classified  Rationale for Evaluation and Treatment: Rehabilitation  ONSET DATE: 06/2024  SUBJECTIVE:   SUBJECTIVE STATEMENT: Patient reports that she has been doing exercises. Her hip may feel a little better. Foot is about the same. She took the heel lifts because they seemed to make heel feel worse. Still has pain with walking. She is now walking only 1/2 mile but she walks outside up and down inclines. Asking about night splint for stretching the foot. Has an appointment with an acupuncturist Friday to see if that helps.  Eval: Pt states she has Rt plantar fasciitis and Lt hip pain. She had shockwave treatment for Rt foot but states it did not help. She states MD gave her exercises but she has not been able to do them consistently. She does silver sneakers classes and also walks 1 mile per day with her dog. She states she can tolerate walking 1 mile but after the walk she feels like she has nails in my foot and burning. She states she has pain all the time but it gets a lot worse after walking. She has tried off the shelf inserts and custom made inserts but states both of these increased pain. She states pain decreases a little with use of  ice but she has not found a long lasting solution to pain  PERTINENT HISTORY: History of plantar fasciitis, osteoporosis PAIN:  Are you having pain? Yes: NPRS scale: 6/10 currently 8/10 at worst Pain location: Rt heel Pain description: burning, stabbing Aggravating factors: walk Relieving factors: ice  PRECAUTIONS: Other: osteoporosis   WEIGHT BEARING RESTRICTIONS: No  FALLS:  Has patient fallen in last 6 months? No    OCCUPATION: retired - enjoys silver sneakers classes, walking her dog   PATIENT GOALS: decrease pain quickly  NEXT MD VISIT:  PRN  OBJECTIVE:  Note: Objective measures were completed at Evaluation unless otherwise noted.  DIAGNOSTIC FINDINGS: no recent imaging on file  PATIENT SURVEYS:  PSFS: THE PATIENT SPECIFIC FUNCTIONAL SCALE  Place score of 0-10 (0 = unable to perform activity and 10 = able to perform activity at the same level as before injury or problem)  Activity Date: 09/02/24    Walking x 1 mile 5    2.go up on my toes 0    3.     4.      Average Score 2.5      Total Score = Sum of activity scores/number of activities  Minimally Detectable Change: 3 points (for single activity); 2 points (for average score)  [facility] (nd). The Patient Specific Functional Scale . Retrieved from [email or web address]    PALPATION: Decreased toe flexion and extension actively TTP and light touch plantar surface of Rt foot from heel to toes  LOWER EXTREMITY ROM:  Active ROM Right eval Left eval  Hip flexion    Hip extension    Hip abduction    Hip adduction    Hip internal rotation    Hip external rotation    Knee flexion    Knee extension    Ankle dorsiflexion 10 15  Ankle plantarflexion 61 65  Ankle inversion 30 30  Ankle eversion 24 25   (Blank rows = not tested)  LOWER EXTREMITY MMT:  MMT Right eval Left eval  Hip flexion    Hip extension    Hip abduction    Hip adduction    Hip internal rotation    Hip external rotation    Knee flexion    Knee extension    Ankle dorsiflexion 3+ 4+  Ankle plantarflexion 3- 4+  Ankle inversion 4- 4+  Ankle eversion 3- 4+   (Blank rows = not tested)    FUNCTIONAL TESTS:   SLS: Rt 10 seconds - pain!, Lt 4 seconds  GAIT: Distance walked: 110' Assistive device utilized: None Level of assistance: Complete Independence Comments: no  heel strike on Rt, decreased Rt toe off  [facility] Adult PT Treatment:                                                DATE: 09/09/24 Therapeutic  Exercise: Supine  Piriformis stretch travell 30 sec x 2  Hamstring stretch with strap 30 sec x 2 ITB stretch 30 sec x 3   Manual Therapy: Iliacus release R/L 60 sec  TPR psoas R/L Myofacial release R LE  Mobs R rear and forefoot  PROM through toes  Taping for plantar fascitis - 3 strips longitudinally on plantar surface; 3 strips horizontally across plantar surface 60% pull   Neuromuscular re-ed: (suggestions to prepare tissue prior to standing/walking)  Sitting feet flat  on floor patient pressing through the knees to add gradual wt LE's through heels  Standing weight bearing bilat LE's  Therapeutic Activity: Gastroc stretch at chair 20 sec x 2 Soleus stretch at chair 20 sec x 2  Ball release posterior and anterior hip musculature bilat 4 in plastic ball  Golf ball myofascial release through the plantar fascia sitting  Self Care: Avoid walking up and down inclines  Discussed preparing for walking through sitting and standing weight bearing Ascend and descend steps with flat feet and not on bals of feet    [facility] Adult PT Treatment:                                                DATE: 09/04/24 Therapeutic Exercise: Supine  Piriformis stretch travell 30 sec x 2  Hamstring stretch with strap 30 sec x 1 ITB stretch 30 sec x 3   Manual Therapy: Iliacus release R/L 60 sec  TPR psoas R/L Myofacial release R LE  Mobs R rear and forefoot  PROM through toes  Neuromuscular re-ed: (suggestions to prepare tissue prior to walking)  Sitting feet flat on floor patient pressing through the knees to add gradual wt LE's through heels  Standing weight bearing bilat LE's  Therapeutic Activity: Gastroc stretch at chair 20 sec x 2 Soleus stretch at chair 20 sec x 2  Self Care: Added heel lift bilat shoes ~ 2/8's to 3/8's inch with patient gel inserts  Discussed preparing for walking through sitting and standing weight bearing Ascend and descend steps with flat feet and not on bals of feet                                                                                                                                 TREATMENT DATE: 09/02/24 See HEP Pt educated on PT POC and goals, HEP, rationale for treatment, activity modifications    PATIENT EDUCATION:  Education details: PT POC and goals, HEP Person educated: Patient Education method: Explanation, Demonstration, and Handouts Education comprehension: verbalized understanding and returned demonstration  HOME EXERCISE PROGRAM: Access Code: [email or web address] URL: [email or web address] Date: 09/04/2024 Prepared by: Derrik Mceachern  Exercises - Towel Scrunches  - 1 x daily - 7 x weekly - 3 sets - 10 reps - Toe Spreading  - 1 x daily - 7 x weekly - 2 sets - 10 reps - Seated Great Toe Extension  - 1 x daily - 7 x weekly - 2 sets - 10 reps - Supine Piriformis Stretch with Leg Straight  - 2 x daily - 7 x weekly - 1 sets - 3 reps - 30 sec  hold - Hooklying Hamstring Stretch with Strap  - 1 x daily - 7 x weekly - 1 sets - 3 reps - 30 sec  hold -  Supine ITB Stretch with Strap  - 1 x daily - 7 x weekly - 1 sets - 3 reps - 30 sec  hold - Standing Gastroc Stretch  - 2 x daily - 7 x weekly - 1 sets - 3 reps - 20-30 sec  hold - Standing Gastroc Stretch  - 2 x daily - 7 x weekly - 1 sets - 3 reps - 20-30 sec  hold - Standing Piriformis Release with Ball at Wall  - 2 x daily - 7 x weekly - 30-60 sec  hold  ASSESSMENT:  CLINICAL IMPRESSION: Patient returns reporting some improvement in L hip pain. Foot is not significantly improved. Continued with manual work and exercise as noted. Trial of tape for plantar fascitis. Will hold plantar fascia stretch on step which increased pain and continue with gastroc and soleus stretches. Patient advised to avoid pain with stretches and continue stretch feeling a pull or stretch no pain. Patient has persistent tightness in bilat hip flexors and L posterior lateral hip. Continued myofacial release work through  bilat hips and R LE; stretches for hips; mobilization of R foot and ankle. Trial of heel lifts bilat shoes to lift heel discontinued. Trial of kinesotaping for plantar fascia. Advised on use and care of tape and will discontinue tape if symptoms are increased. Pain persisted post treatment.   Eval: Patient is a 74 y.o. female who was seen today for physical therapy evaluation and treatment for Right plantar fasciitis. She presents with decreased strength and ROM in Rt foot and ankle, decreased activity tolerance, impaired gait and decreased functional mobility. She will benefit from skilled PT to address deficits and improve mobility with decreased pain.   OBJECTIVE IMPAIRMENTS: Abnormal gait, decreased activity tolerance, decreased balance, difficulty walking, decreased ROM, decreased strength, and pain.     GOALS: Goals reviewed with patient? Yes  SHORT TERM GOALS: Target date: 09/30/2024   Pt will be independent with initial HEP Baseline: Goal status: INITIAL  2.  Pt will report <= 6/10 pain at worst Baseline: 10/10 Goal status: INITIAL    LONG TERM GOALS: Target date: 10/28/2024    Pt will be independent in advanced HEP including return to walking and full silver sneakers routine Baseline:  Goal status: INITIAL  2.  Pt will improve Rt ankle strength to >= 4/5 to improve tolerance to walking Baseline:  Goal status: INITIAL  3.  Pt will improve PSFS average score to >= 5.5 to demo improved functional mobility Baseline:  Goal status: INITIAL  4.  Pt will tolerate SLS on Rt LE x 20 seconds without increase in pain Baseline:  Goal status: INITIAL    PLAN:  PT FREQUENCY: 1-2x/week  PT DURATION: 8 weeks  PLANNED INTERVENTIONS: 97164- PT Re-evaluation, 97110-Therapeutic exercises, 97530- Therapeutic activity, 97112- Neuromuscular re-education, 97535- Self Care, 02859- Manual therapy, [id number]- Gait training, [phone]- Aquatic Therapy, [phone]- Electrical stimulation (unattended),  [id number]- Ultrasound, [id number]- Ionotophoresis 4mg /ml Dexamethasone , 79439 (1-2 muscles), 20561 (3+ muscles)- Dry Needling, Patient/Family education, Balance training, Stair training, Taping, Cryotherapy, and Moist heat  PLAN FOR NEXT SESSION: taping? Assess response to HEP, toe stretching and strengthening   Fard Borunda SHAUNNA Baptist, PT 09/09/2024, 11:47 AM  "

## 2024-09-09 NOTE — Assessment & Plan Note (Signed)
 Bloating/Belching Chronic bloating and gas managed with simethicone . Discussed potential food triggers and adjunctive therapies. - Continue simethicone  125 mg daily. - Consider trying digestive enzyme supplement with peppermint oil, monitor for heartburn. - Continue probiotic supplementation.

## 2024-09-11 ENCOUNTER — Ambulatory Visit

## 2024-09-11 DIAGNOSIS — R262 Difficulty in walking, not elsewhere classified: Secondary | ICD-10-CM

## 2024-09-11 DIAGNOSIS — M722 Plantar fascial fibromatosis: Secondary | ICD-10-CM | POA: Diagnosis not present

## 2024-09-11 NOTE — Therapy (Signed)
 " OUTPATIENT PHYSICAL THERAPY LOWER EXTREMITY TREATMENT   Patient Name: Isabel Tyler MRN: [id number] DOB:1951/06/27, 74 y.o., female Today's Date: 09/11/2024  END OF SESSION:  PT End of Session - 09/11/24 1402     Visit Number 4    Number of Visits 16    Date for Recertification  10/28/24    Authorization Type Medicare A and B    Progress Note Due on Visit 10    PT Start Time 1402    PT Stop Time 1445    PT Time Calculation (min) 43 min    Activity Tolerance Patient tolerated treatment well    Behavior During Therapy WFL for tasks assessed/performed          Past Medical History:  Diagnosis Date   Allergy 1976   tree pollen   Anxiety    Diverticular disease    GERD (gastroesophageal reflux disease)    Heart murmur mitralvalve prolapse   Hx of diverticulitis of colon 04/04/2017   Hyperlipidemia, mixed    Hypothyroidism 2000   Osteoporosis 0steoporosis   Vitamin D  deficiency    Past Surgical History:  Procedure Laterality Date   BREAST BIOPSY Left    benign   CHOLECYSTECTOMY N/A 02/08/2017   Procedure: LAPAROSCOPIC CHOLECYSTECTOMY WITH INTRAOPERATIVE CHOLANGIOGRAM;  Surgeon: Belinda Cough, MD;  Location: [facility];  Service: General;  Laterality: N/A;   EYE SURGERY     Eyelid lifted   TONSILLECTOMY     Patient Active Problem List   Diagnosis Date Noted   Bloating symptom 09/09/2024   IBS (irritable bowel syndrome)- Diarrhea predominent 07/01/2024   GAD (generalized anxiety disorder) 05/15/2024   Supraventricular tachycardia 05/15/2024   Hypoglycemia 11/16/2023   Osteoporosis 10/07/2019   Rosacea 12/12/2018   Trigger thumb, left thumb 09/26/2018   Lumbar spinal stenosis 09/03/2018   Vitamin D  deficiency 06/06/2018   Abnormal glucose 06/06/2018   Palpitations 06/06/2018   Gastroesophageal reflux disease 04/04/2017   Hepatic hemangioma, Rt lobe (7 cm) Jan 2017 12/12/2016   Hyperlipidemia, mixed 10/23/2016   Hypothyroidism 10/23/2016   Major depressive  disorder, recurrent, in full remission 10/23/2016   Diverticulosis of colon without hemorrhage 10/23/2016    PCP: Al[facility]  REFERRING PROVIDER: Gottwalt  REFERRING DIAG: plantar fasciitis  THERAPY DIAG:  Plantar fasciitis of right foot  Difficulty in walking, not elsewhere classified  Rationale for Evaluation and Treatment: Rehabilitation  ONSET DATE: 06/2024  SUBJECTIVE:   SUBJECTIVE STATEMENT: Patient reports the taping helped with pain and still it on. Patient states she has 5/10 when first standing up and then decreases to 3/10 with kinesiotape on.   Eval: Pt states she has Rt plantar fasciitis and Lt hip pain. She had shockwave treatment for Rt foot but states it did not help. She states MD gave her exercises but she has not been able to do them consistently. She does silver sneakers classes and also walks 1 mile per day with her dog. She states she can tolerate walking 1 mile but after the walk she feels like she has nails in my foot and burning. She states she has pain all the time but it gets a lot worse after walking. She has tried off the shelf inserts and custom made inserts but states both of these increased pain. She states pain decreases a little with use of ice but she has not found a long lasting solution to pain  PERTINENT HISTORY: History of plantar fasciitis, osteoporosis PAIN:  Are you having pain? Yes: NPRS scale:  5/10 worst, 3/10 with tape Pain location: Rt heel Pain description: burning, stabbing Aggravating factors: walk Relieving factors: ice  PRECAUTIONS: Other: osteoporosis   WEIGHT BEARING RESTRICTIONS: No  FALLS:  Has patient fallen in last 6 months? No    OCCUPATION: retired - enjoys silver sneakers classes, walking her dog   PATIENT GOALS: decrease pain quickly  NEXT MD VISIT: PRN  OBJECTIVE:  Note: Objective measures were completed at Evaluation unless otherwise noted.  DIAGNOSTIC FINDINGS: no recent imaging on file  PATIENT  SURVEYS:  PSFS: THE PATIENT SPECIFIC FUNCTIONAL SCALE  Place score of 0-10 (0 = unable to perform activity and 10 = able to perform activity at the same level as before injury or problem)  Activity Date: 09/02/24    Walking x 1 mile 5    2.go up on my toes 0    3.     4.      Average Score 2.5      Total Score = Sum of activity scores/number of activities  Minimally Detectable Change: 3 points (for single activity); 2 points (for average score)  [facility] (nd). The Patient Specific Functional Scale . Retrieved from [employer]ateo[employer].com.[email or web address]    PALPATION: Decreased toe flexion and extension actively TTP and light touch plantar surface of Rt foot from heel to toes  LOWER EXTREMITY ROM:  Active ROM Right eval Left eval  Hip flexion    Hip extension    Hip abduction    Hip adduction    Hip internal rotation    Hip external rotation    Knee flexion    Knee extension    Ankle dorsiflexion 10 15  Ankle plantarflexion 61 65  Ankle inversion 30 30  Ankle eversion 24 25   (Blank rows = not tested)  LOWER EXTREMITY MMT:  MMT Right eval Left eval  Hip flexion    Hip extension    Hip abduction    Hip adduction    Hip internal rotation    Hip external rotation    Knee flexion    Knee extension    Ankle dorsiflexion 3+ 4+  Ankle plantarflexion 3- 4+  Ankle inversion 4- 4+  Ankle eversion 3- 4+   (Blank rows = not tested)    FUNCTIONAL TESTS:   SLS: Rt 10 seconds - pain!, Lt 4 seconds  GAIT: Distance walked: 110' Assistive device utilized: None Level of assistance: Complete Independence Comments: no  heel strike on Rt, decreased Rt toe off   [facility] Adult PT Treatment:                                                DATE: 09/11/2024 Therapeutic Exercise: Toe spreading stretch (threading fingers through toes) --> ankle pumps & circles Great toe stretch Plantar flexor stretch Gastroc stretch with great  toe extension Manual Therapy: Taping for plantar fascitis - 3 strips longitudinally on plantar surface; 3 strips horizontally across plantar surface 60% pull   Therapeutic Activity: Towel scrunch Great toe extension Toe yoga Arch lifting Self Care: Silicone toe spacers  [facility] Adult PT Treatment:  DATE: 09/09/24 Therapeutic Exercise: Supine  Piriformis stretch travell 30 sec x 2  Hamstring stretch with strap 30 sec x 2 ITB stretch 30 sec x 3   Manual Therapy: Iliacus release R/L 60 sec  TPR psoas R/L Myofacial release R LE  Mobs R rear and forefoot  PROM through toes  Taping for plantar fascitis - 3 strips longitudinally on plantar surface; 3 strips horizontally across plantar surface 60% pull   Neuromuscular re-ed: (suggestions to prepare tissue prior to standing/walking)  Sitting feet flat on floor patient pressing through the knees to add gradual wt LE's through heels  Standing weight bearing bilat LE's  Therapeutic Activity: Gastroc stretch at chair 20 sec x 2 Soleus stretch at chair 20 sec x 2  Ball release posterior and anterior hip musculature bilat 4 in plastic ball  Golf ball myofascial release through the plantar fascia sitting  Self Care: Avoid walking up and down inclines  Discussed preparing for walking through sitting and standing weight bearing Ascend and descend steps with flat feet and not on bals of feet     [facility] Adult PT Treatment:                                                DATE: 09/04/24 Therapeutic Exercise: Supine  Piriformis stretch travell 30 sec x 2  Hamstring stretch with strap 30 sec x 1 ITB stretch 30 sec x 3   Manual Therapy: Iliacus release R/L 60 sec  TPR psoas R/L Myofacial release R LE  Mobs R rear and forefoot  PROM through toes  Neuromuscular re-ed: (suggestions to prepare tissue prior to walking)  Sitting feet flat on floor patient pressing through the knees to add gradual wt LE's  through heels  Standing weight bearing bilat LE's  Therapeutic Activity: Gastroc stretch at chair 20 sec x 2 Soleus stretch at chair 20 sec x 2  Self Care: Added heel lift bilat shoes ~ 2/8's to 3/8's inch with patient gel inserts  Discussed preparing for walking through sitting and standing weight bearing Ascend and descend steps with flat feet and not on bals of feet                                                                                                                                PATIENT EDUCATION:  Education details: PT POC and goals, HEP Person educated: Patient Education method: Explanation, Demonstration, and Handouts Education comprehension: verbalized understanding and returned demonstration  HOME EXERCISE PROGRAM: Access Code: [email or web address] URL: [email or web address] Date: 09/11/2024 Prepared by: Lamarr Price  Exercises - Towel Scrunches  - 1 x daily - 7 x weekly - 3 sets - 10 reps - Toe Spreading  - 1 x daily - 7 x weekly - 2 sets - 10 reps - Seated Great Toe  Extension  - 1 x daily - 7 x weekly - 2 sets - 10 reps - Supine Piriformis Stretch with Leg Straight  - 2 x daily - 7 x weekly - 1 sets - 3 reps - 30 sec  hold - Hooklying Hamstring Stretch with Strap  - 1 x daily - 7 x weekly - 1 sets - 3 reps - 30 sec  hold - Supine ITB Stretch with Strap  - 1 x daily - 7 x weekly - 1 sets - 3 reps - 30 sec  hold - Standing Gastroc Stretch  - 2 x daily - 7 x weekly - 1 sets - 3 reps - 20-30 sec  hold - Standing Piriformis Release with Ball at Wall  - 2 x daily - 7 x weekly - 30-60 sec  hold - Toe Yoga - Alternating Great Toe and Lesser Toe Extension  - 1 x daily - 7 x weekly - 3 sets - 10 reps  ASSESSMENT:  CLINICAL IMPRESSION: Continued intrinsic foot strengthening exercises and progressing great toe mobility. Limited great toe extension noted; initial tactile assist to stabilize toes during toe yoga exercise improved mechanics with trying exercise with no  assist. Recommended patient consider toe spacers to address tightness/tension in forefoot.   Eval: Patient is a 74 y.o. female who was seen today for physical therapy evaluation and treatment for Right plantar fasciitis. She presents with decreased strength and ROM in Rt foot and ankle, decreased activity tolerance, impaired gait and decreased functional mobility. She will benefit from skilled PT to address deficits and improve mobility with decreased pain.   OBJECTIVE IMPAIRMENTS: Abnormal gait, decreased activity tolerance, decreased balance, difficulty walking, decreased ROM, decreased strength, and pain.     GOALS: Goals reviewed with patient? Yes  SHORT TERM GOALS: Target date: 09/30/2024  Pt will be independent with initial HEP Baseline: Goal status: INITIAL  2.  Pt will report <= 6/10 pain at worst Baseline: 10/10 Goal status: INITIAL    LONG TERM GOALS: Target date: 10/28/2024  Pt will be independent in advanced HEP including return to walking and full silver sneakers routine Baseline:  Goal status: INITIAL  2.  Pt will improve Rt ankle strength to >= 4/5 to improve tolerance to walking Baseline:  Goal status: INITIAL  3.  Pt will improve PSFS average score to >= 5.5 to demo improved functional mobility Baseline:  Goal status: INITIAL  4.  Pt will tolerate SLS on Rt LE x 20 seconds without increase in pain Baseline:  Goal status: INITIAL    PLAN:  PT FREQUENCY: 1-2x/week  PT DURATION: 8 weeks  PLANNED INTERVENTIONS: 97164- PT Re-evaluation, 97110-Therapeutic exercises, 97530- Therapeutic activity, 97112- Neuromuscular re-education, 97535- Self Care, 02859- Manual therapy, [id number]- Gait training, [phone]- Aquatic Therapy, [phone]- Electrical stimulation (unattended), [id number]- Ultrasound, [id number]- Ionotophoresis 4mg /ml Dexamethasone , 79439 (1-2 muscles), 20561 (3+ muscles)- Dry Needling, Patient/Family education, Balance training, Stair training, Taping, Cryotherapy, and  Moist heat  PLAN FOR NEXT SESSION: Assess response to HEP, toe stretching and strengthening   Lamarr GORMAN Price, PTA 09/11/2024, 3:14 PM  "

## 2024-09-16 ENCOUNTER — Encounter: Payer: Self-pay | Admitting: Physical Therapy

## 2024-09-16 ENCOUNTER — Ambulatory Visit: Admitting: Physical Therapy

## 2024-09-16 DIAGNOSIS — M722 Plantar fascial fibromatosis: Secondary | ICD-10-CM | POA: Diagnosis not present

## 2024-09-16 DIAGNOSIS — R262 Difficulty in walking, not elsewhere classified: Secondary | ICD-10-CM

## 2024-09-16 NOTE — Therapy (Signed)
 " OUTPATIENT PHYSICAL THERAPY LOWER EXTREMITY TREATMENT   Patient Name: Isabel Tyler MRN: [id number] DOB:November 07, 1950, 74 y.o., female Today's Date: 09/16/2024  END OF SESSION:  PT End of Session - 09/16/24 1358     Visit Number 5    Number of Visits 16    Date for Recertification  10/28/24    Authorization Type Medicare A and B    Authorization - Visit Number 4    Progress Note Due on Visit 10    PT Start Time 1315    PT Stop Time 1355    PT Time Calculation (min) 40 min    Activity Tolerance Patient tolerated treatment well    Behavior During Therapy WFL for tasks assessed/performed           Past Medical History:  Diagnosis Date   Allergy 1976   tree pollen   Anxiety    Diverticular disease    GERD (gastroesophageal reflux disease)    Heart murmur mitralvalve prolapse   Hx of diverticulitis of colon 04/04/2017   Hyperlipidemia, mixed    Hypothyroidism 2000   Osteoporosis 0steoporosis   Vitamin D  deficiency    Past Surgical History:  Procedure Laterality Date   BREAST BIOPSY Left    benign   CHOLECYSTECTOMY N/A 02/08/2017   Procedure: LAPAROSCOPIC CHOLECYSTECTOMY WITH INTRAOPERATIVE CHOLANGIOGRAM;  Surgeon: Belinda Cough, MD;  Location: [facility];  Service: General;  Laterality: N/A;   EYE SURGERY     Eyelid lifted   TONSILLECTOMY     Patient Active Problem List   Diagnosis Date Noted   Bloating symptom 09/09/2024   IBS (irritable bowel syndrome)- Diarrhea predominent 07/01/2024   GAD (generalized anxiety disorder) 05/15/2024   Supraventricular tachycardia 05/15/2024   Hypoglycemia 11/16/2023   Osteoporosis 10/07/2019   Rosacea 12/12/2018   Trigger thumb, left thumb 09/26/2018   Lumbar spinal stenosis 09/03/2018   Vitamin D  deficiency 06/06/2018   Abnormal glucose 06/06/2018   Palpitations 06/06/2018   Gastroesophageal reflux disease 04/04/2017   Hepatic hemangioma, Rt lobe (7 cm) Jan 2017 12/12/2016   Hyperlipidemia, mixed 10/23/2016    Hypothyroidism 10/23/2016   Major depressive disorder, recurrent, in full remission 10/23/2016   Diverticulosis of colon without hemorrhage 10/23/2016    PCP: Alvan  REFERRING PROVIDER: Gottwalt  REFERRING DIAG: plantar fasciitis  THERAPY DIAG:  Plantar fasciitis of right foot  Difficulty in walking, not elsewhere classified  Rationale for Evaluation and Treatment: Rehabilitation  ONSET DATE: 06/2024  SUBJECTIVE:   SUBJECTIVE STATEMENT: Pt states the tape really helps but she has kept it off so she can benefit more from the exercises  Eval: Pt states she has Rt plantar fasciitis and Lt hip pain. She had shockwave treatment for Rt foot but states it did not help. She states MD gave her exercises but she has not been able to do them consistently. She does silver sneakers classes and also walks 1 mile per day with her dog. She states she can tolerate walking 1 mile but after the walk she feels like she has nails in my foot and burning. She states she has pain all the time but it gets a lot worse after walking. She has tried off the shelf inserts and custom made inserts but states both of these increased pain. She states pain decreases a little with use of ice but she has not found a long lasting solution to pain  PERTINENT HISTORY: History of plantar fasciitis, osteoporosis PAIN:  Are you having pain? Yes: NPRS scale:  5/10 worst, 3/10 with tape Pain location: Rt heel Pain description: burning, stabbing Aggravating factors: walk Relieving factors: ice  PRECAUTIONS: Other: osteoporosis   WEIGHT BEARING RESTRICTIONS: No  FALLS:  Has patient fallen in last 6 months? No    OCCUPATION: retired - enjoys silver sneakers classes, walking her dog   PATIENT GOALS: decrease pain quickly  NEXT MD VISIT: PRN  OBJECTIVE:  Note: Objective measures were completed at Evaluation unless otherwise noted.  DIAGNOSTIC FINDINGS: no recent imaging on file  PATIENT SURVEYS:   PSFS: THE PATIENT SPECIFIC FUNCTIONAL SCALE  Place score of 0-10 (0 = unable to perform activity and 10 = able to perform activity at the same level as before injury or problem)  Activity Date: 09/02/24    Walking x 1 mile 5    2.go up on my toes 0    3.     4.      Average Score 2.5      Total Score = Sum of activity scores/number of activities  Minimally Detectable Change: 3 points (for single activity); 2 points (for average score)  [facility] (nd). The Patient Specific Functional Scale . Retrieved from [email or web address]    PALPATION: Decreased toe flexion and extension actively TTP and light touch plantar surface of Rt foot from heel to toes  LOWER EXTREMITY ROM:  Active ROM Right eval Left eval  Hip flexion    Hip extension    Hip abduction    Hip adduction    Hip internal rotation    Hip external rotation    Knee flexion    Knee extension    Ankle dorsiflexion 10 15  Ankle plantarflexion 61 65  Ankle inversion 30 30  Ankle eversion 24 25   (Blank rows = not tested)  LOWER EXTREMITY MMT:  MMT Right eval Left eval  Hip flexion    Hip extension    Hip abduction    Hip adduction    Hip internal rotation    Hip external rotation    Knee flexion    Knee extension    Ankle dorsiflexion 3+ 4+  Ankle plantarflexion 3- 4+  Ankle inversion 4- 4+  Ankle eversion 3- 4+   (Blank rows = not tested)    FUNCTIONAL TESTS:   SLS: Rt 10 seconds - pain!, Lt 4 seconds  GAIT: Distance walked: 110' Assistive device utilized: None Level of assistance: Complete Independence Comments: no  heel strike on Rt, decreased Rt toe off   [facility] Adult PT Treatment:                                                DATE: 09/16/24 Therapeutic Exercise: Gastroc stretch 2 x 30 sec Soleus stretch 2 x 30 sec HS stretch 2 x 30 sec ITB stretch 2 x 30 sec Manual Therapy: STM plantar surface of Rt foot Taping for  plantar fascitis - 3 strips longitudinally on plantar surface; 3 strips horizontally across plantar surface 60% pull  Neuromuscular re-ed: Toe yoga SLS x 30 sec - some increased pain Tandem stance Rt foot in back - mild increase in pain Therapeutic Activity: Toe extension Toe spreading Arch lifting Towel scrunch   [facility] Adult PT Treatment:  DATE: 09/11/2024 Therapeutic Exercise: Toe spreading stretch (threading fingers through toes) --> ankle pumps & circles Great toe stretch Plantar flexor stretch Gastroc stretch with great toe extension Manual Therapy: Taping for plantar fascitis - 3 strips longitudinally on plantar surface; 3 strips horizontally across plantar surface 60% pull   Therapeutic Activity: Towel scrunch Great toe extension Toe yoga Arch lifting Golf ball myofascial release through the plantar fascia sitting  Self Care: Silicone toe spacers  [facility] Adult PT Treatment:                                                DATE: 09/09/24 Therapeutic Exercise: Supine  Piriformis stretch travell 30 sec x 2  Hamstring stretch with strap 30 sec x 2 ITB stretch 30 sec x 3   Manual Therapy: Iliacus release R/L 60 sec  TPR psoas R/L Myofacial release R LE  Mobs R rear and forefoot  PROM through toes  Taping for plantar fascitis - 3 strips longitudinally on plantar surface; 3 strips horizontally across plantar surface 60% pull   Neuromuscular re-ed: (suggestions to prepare tissue prior to standing/walking)  Sitting feet flat on floor patient pressing through the knees to add gradual wt LE's through heels  Standing weight bearing bilat LE's  Therapeutic Activity: Gastroc stretch at chair 20 sec x 2 Soleus stretch at chair 20 sec x 2  Ball release posterior and anterior hip musculature bilat 4 in plastic ball  Golf ball myofascial release through the plantar fascia sitting  Self Care: Avoid walking up and down inclines  Discussed  preparing for walking through sitting and standing weight bearing Ascend and descend steps with flat feet and not on bals of feet                                                                                                                                 PATIENT EDUCATION:  Education details: PT POC and goals, HEP Person educated: Patient Education method: Explanation, Demonstration, and Handouts Education comprehension: verbalized understanding and returned demonstration  HOME EXERCISE PROGRAM: Access Code: [email or web address] URL: [email or web address] Date: 09/11/2024 Prepared by: Lamarr Price  Exercises - Towel Scrunches  - 1 x daily - 7 x weekly - 3 sets - 10 reps - Toe Spreading  - 1 x daily - 7 x weekly - 2 sets - 10 reps - Seated Great Toe Extension  - 1 x daily - 7 x weekly - 2 sets - 10 reps - Supine Piriformis Stretch with Leg Straight  - 2 x daily - 7 x weekly - 1 sets - 3 reps - 30 sec  hold - Hooklying Hamstring Stretch with Strap  - 1 x daily - 7 x weekly - 1 sets - 3 reps - 30 sec  hold - Supine ITB Stretch with  Strap  - 1 x daily - 7 x weekly - 1 sets - 3 reps - 30 sec  hold - Standing Gastroc Stretch  - 2 x daily - 7 x weekly - 1 sets - 3 reps - 20-30 sec  hold - Standing Piriformis Release with Ball at Wall  - 2 x daily - 7 x weekly - 30-60 sec  hold - Toe Yoga - Alternating Great Toe and Lesser Toe Extension  - 1 x daily - 7 x weekly - 3 sets - 10 reps  ASSESSMENT:  CLINICAL IMPRESSION: Pt is improving performance with foot intrinsic exercises but continues with pain with SLS and tandem stance due to pain with wt bearing on heels. She states she feels comfortable with HEP and hopes to d/c after next visit.  Eval: Patient is a 74 y.o. female who was seen today for physical therapy evaluation and treatment for Right plantar fasciitis. She presents with decreased strength and ROM in Rt foot and ankle, decreased activity tolerance, impaired gait and decreased  functional mobility. She will benefit from skilled PT to address deficits and improve mobility with decreased pain.   OBJECTIVE IMPAIRMENTS: Abnormal gait, decreased activity tolerance, decreased balance, difficulty walking, decreased ROM, decreased strength, and pain.     GOALS: Goals reviewed with patient? Yes  SHORT TERM GOALS: Target date: 09/30/2024  Pt will be independent with initial HEP Baseline: Goal status: INITIAL  2.  Pt will report <= 6/10 pain at worst Baseline: 10/10 Goal status: INITIAL    LONG TERM GOALS: Target date: 10/28/2024  Pt will be independent in advanced HEP including return to walking and full silver sneakers routine Baseline:  Goal status: INITIAL  2.  Pt will improve Rt ankle strength to >= 4/5 to improve tolerance to walking Baseline:  Goal status: INITIAL  3.  Pt will improve PSFS average score to >= 5.5 to demo improved functional mobility Baseline:  Goal status: INITIAL  4.  Pt will tolerate SLS on Rt LE x 20 seconds without increase in pain Baseline:  Goal status: INITIAL    PLAN:  PT FREQUENCY: 1-2x/week  PT DURATION: 8 weeks  PLANNED INTERVENTIONS: 97164- PT Re-evaluation, 97110-Therapeutic exercises, 97530- Therapeutic activity, 97112- Neuromuscular re-education, 97535- Self Care, 02859- Manual therapy, [id number]- Gait training, [phone]- Aquatic Therapy, [id number]- Electrical stimulation (unattended), [id number]- Ultrasound, [id number]- Ionotophoresis 4mg /ml Dexamethasone , 79439 (1-2 muscles), 20561 (3+ muscles)- Dry Needling, Patient/Family education, Balance training, Stair training, Taping, Cryotherapy, and Moist heat  PLAN FOR NEXT SESSION: finalize HEP, d/c   Arcangel Minion, PT 09/16/2024, 1:59 PM  "

## 2024-09-18 ENCOUNTER — Ambulatory Visit

## 2024-09-18 DIAGNOSIS — M722 Plantar fascial fibromatosis: Secondary | ICD-10-CM

## 2024-09-18 DIAGNOSIS — R262 Difficulty in walking, not elsewhere classified: Secondary | ICD-10-CM

## 2024-09-18 NOTE — Therapy (Signed)
 " OUTPATIENT PHYSICAL THERAPY LOWER EXTREMITY TREATMENT   Patient Name: Isabel Tyler MRN: [id number] DOB:1950-12-10, 74 y.o., female Today's Date: 09/18/2024  END OF SESSION:  PT End of Session - 09/18/24 1402     Visit Number 6    Number of Visits 16    Date for Recertification  10/28/24    Authorization Type Medicare A and B    Progress Note Due on Visit 10    PT Start Time 1402    Activity Tolerance Patient tolerated treatment well    Behavior During Therapy [facility] for tasks assessed/performed         Past Medical History:  Diagnosis Date   Allergy 1976   tree pollen   Anxiety    Diverticular disease    GERD (gastroesophageal reflux disease)    Heart murmur mitralvalve prolapse   Hx of diverticulitis of colon 04/04/2017   Hyperlipidemia, mixed    Hypothyroidism 2000   Osteoporosis 0steoporosis   Vitamin D  deficiency    Past Surgical History:  Procedure Laterality Date   BREAST BIOPSY Left    benign   CHOLECYSTECTOMY N/A 02/08/2017   Procedure: LAPAROSCOPIC CHOLECYSTECTOMY WITH INTRAOPERATIVE CHOLANGIOGRAM;  Surgeon: Belinda Cough, MD;  Location: [facility];  Service: General;  Laterality: N/A;   EYE SURGERY     Eyelid lifted   TONSILLECTOMY     Patient Active Problem List   Diagnosis Date Noted   Bloating symptom 09/09/2024   IBS (irritable bowel syndrome)- Diarrhea predominent 07/01/2024   GAD (generalized anxiety disorder) 05/15/2024   Supraventricular tachycardia 05/15/2024   Hypoglycemia 11/16/2023   Osteoporosis 10/07/2019   Rosacea 12/12/2018   Trigger thumb, left thumb 09/26/2018   Lumbar spinal stenosis 09/03/2018   Vitamin D  deficiency 06/06/2018   Abnormal glucose 06/06/2018   Palpitations 06/06/2018   Gastroesophageal reflux disease 04/04/2017   Hepatic hemangioma, Rt lobe (7 cm) Jan 2017 12/12/2016   Hyperlipidemia, mixed 10/23/2016   Hypothyroidism 10/23/2016   Major depressive disorder, recurrent, in full remission 10/23/2016    Diverticulosis of colon without hemorrhage 10/23/2016    PCP: [facility]  REFERRING PROVIDER: Gottwalt  REFERRING DIAG: plantar fasciitis  THERAPY DIAG:  Plantar fasciitis of right foot  Difficulty in walking, not elsewhere classified  Rationale for Evaluation and Treatment: Rehabilitation  ONSET DATE: 06/2024  SUBJECTIVE:   SUBJECTIVE STATEMENT: Patient reports her pain averages at 3/10, states she has been doing her exercises without the tape. Patient states she feels like she is independent enough to continues exercises on her own and is ready to discharge PT today.   Eval: Pt states she has Rt plantar fasciitis and Lt hip pain. She had shockwave treatment for Rt foot but states it did not help. She states MD gave her exercises but she has not been able to do them consistently. She does silver sneakers classes and also walks 1 mile per day with her dog. She states she can tolerate walking 1 mile but after the walk she feels like she has nails in my foot and burning. She states she has pain all the time but it gets a lot worse after walking. She has tried off the shelf inserts and custom made inserts but states both of these increased pain. She states pain decreases a little with use of ice but she has not found a long lasting solution to pain  PERTINENT HISTORY: History of plantar fasciitis, osteoporosis PAIN:  Are you having pain? Yes: NPRS scale: 3/10 pain Pain location: Rt heel  Pain description: burning, stabbing Aggravating factors: walk Relieving factors: ice  PRECAUTIONS: Other: osteoporosis   WEIGHT BEARING RESTRICTIONS: No  FALLS:  Has patient fallen in last 6 months? No    OCCUPATION: retired - enjoys silver sneakers classes, walking her dog   PATIENT GOALS: decrease pain quickly  NEXT MD VISIT: PRN  OBJECTIVE:  Note: Objective measures were completed at Evaluation unless otherwise noted.  DIAGNOSTIC FINDINGS: no recent imaging on file  PATIENT  SURVEYS:  PSFS: THE PATIENT SPECIFIC FUNCTIONAL SCALE  Place score of 0-10 (0 = unable to perform activity and 10 = able to perform activity at the same level as before injury or problem)  Activity Date: 09/02/24 09/18/24   Walking x 1 mile 5 5   2. go up on my toes 0 0   3.     4.      Average Score 2.5 2.5     Total Score = Sum of activity scores/number of activities  Minimally Detectable Change: 3 points (for single activity); 2 points (for average score)  [employer] [facility] (nd). The Patient Specific Functional Scale . Retrieved from [email or web address].[email or web address]    PALPATION: Decreased toe flexion and extension actively TTP and light touch plantar surface of Rt foot from heel to toes  LOWER EXTREMITY ROM:  Active ROM Right eval Left eval  Hip flexion    Hip extension    Hip abduction    Hip adduction    Hip internal rotation    Hip external rotation    Knee flexion    Knee extension    Ankle dorsiflexion 10 15  Ankle plantarflexion 61 65  Ankle inversion 30 30  Ankle eversion 24 25   (Blank rows = not tested)  LOWER EXTREMITY MMT:  MMT Right eval Left eval  Hip flexion    Hip extension    Hip abduction    Hip adduction    Hip internal rotation    Hip external rotation    Knee flexion    Knee extension    Ankle dorsiflexion 3+ 4+  Ankle plantarflexion 3- 4+  Ankle inversion 4- 4+  Ankle eversion 3- 4+   (Blank rows = not tested)    FUNCTIONAL TESTS:   SLS: Rt 10 seconds - pain!, Lt 4 seconds  GAIT: Distance walked: 110' Assistive device utilized: None Level of assistance: Complete Independence Comments: no  heel strike on Rt, decreased Rt toe off   [facility] Adult PT Treatment:                                                DATE: 09/18/2024 Therapeutic Exercise: Towel scrunch Toe splay Toe yoga Seated heel raises with tennis ball b/w heels Neuromuscular re-ed: Arch lifting in standing Seated  resisted hip abd + red TB around thighs, yellow TB around ankles Side lying clamshells + red TB Side lying hip abd + red TB around thighs Therapeutic Activity: Figure 4 great toe stretch  Stride stance with fwd weight shifting + push off from great toe    [facility] Adult PT Treatment:  DATE: 09/16/24 Therapeutic Exercise: Gastroc stretch 2 x 30 sec Soleus stretch 2 x 30 sec HS stretch 2 x 30 sec ITB stretch 2 x 30 sec Manual Therapy: STM plantar surface of Rt foot Taping for plantar fascitis - 3 strips longitudinally on plantar surface; 3 strips horizontally across plantar surface 60% pull  Neuromuscular re-ed: Toe yoga SLS x 30 sec - some increased pain Tandem stance Rt foot in back - mild increase in pain Therapeutic Activity: Toe extension Toe spreading Arch lifting Towel scrunch   [facility] Adult PT Treatment:                                                DATE: 09/11/2024 Therapeutic Exercise: Toe spreading stretch (threading fingers through toes) --> ankle pumps & circles Great toe stretch Plantar flexor stretch Gastroc stretch with great toe extension Manual Therapy: Taping for plantar fascitis - 3 strips longitudinally on plantar surface; 3 strips horizontally across plantar surface 60% pull   Therapeutic Activity: Towel scrunch Great toe extension Toe yoga Arch lifting Golf ball myofascial release through the plantar fascia sitting  Self Care: Silicone toe spacers  [facility] Adult PT Treatment:                                                DATE: 09/09/24 Therapeutic Exercise: Supine  Piriformis stretch travell 30 sec x 2  Hamstring stretch with strap 30 sec x 2 ITB stretch 30 sec x 3   Manual Therapy: Iliacus release R/L 60 sec  TPR psoas R/L Myofacial release R LE  Mobs R rear and forefoot  PROM through toes  Taping for plantar fascitis - 3 strips longitudinally on plantar surface; 3 strips horizontally across plantar  surface 60% pull   Neuromuscular re-ed: (suggestions to prepare tissue prior to standing/walking)  Sitting feet flat on floor patient pressing through the knees to add gradual wt LE's through heels  Standing weight bearing bilat LE's  Therapeutic Activity: Gastroc stretch at chair 20 sec x 2 Soleus stretch at chair 20 sec x 2  Ball release posterior and anterior hip musculature bilat 4 in plastic ball  Golf ball myofascial release through the plantar fascia sitting  Self Care: Avoid walking up and down inclines  Discussed preparing for walking through sitting and standing weight bearing Ascend and descend steps with flat feet and not on bals of feet                                                                                                                                 PATIENT EDUCATION:  Education details: Updated HEP Person educated: Patient Education method: Explanation, Demonstration, and  Handouts Education comprehension: verbalized understanding and returned demonstration  HOME EXERCISE PROGRAM: Access Code: [email or web address] URL: [email or web address] Date: 09/11/2024 Prepared by: Lamarr Price  Exercises - Towel Scrunches  - 1 x daily - 7 x weekly - 3 sets - 10 reps - Toe Spreading  - 1 x daily - 7 x weekly - 2 sets - 10 reps - Seated Great Toe Extension  - 1 x daily - 7 x weekly - 2 sets - 10 reps - Supine Piriformis Stretch with Leg Straight  - 2 x daily - 7 x weekly - 1 sets - 3 reps - 30 sec  hold - Hooklying Hamstring Stretch with Strap  - 1 x daily - 7 x weekly - 1 sets - 3 reps - 30 sec  hold - Supine ITB Stretch with Strap  - 1 x daily - 7 x weekly - 1 sets - 3 reps - 30 sec  hold - Standing Gastroc Stretch  - 2 x daily - 7 x weekly - 1 sets - 3 reps - 20-30 sec  hold - Standing Piriformis Release with Ball at Wall  - 2 x daily - 7 x weekly - 30-60 sec  hold - Toe Yoga - Alternating Great Toe and Lesser Toe Extension  - 1 x daily - 7 x weekly - 3 sets -  10 reps  ASSESSMENT:  CLINICAL IMPRESSION: Updated HEP with new exercises and noted on how to progress or modify exercises as needed. Discussed with patient on taking break from PT to work independently with exercises before deciding if still wanting to discharge.  Eval: Patient is a 74 y.o. female who was seen today for physical therapy evaluation and treatment for Right plantar fasciitis. She presents with decreased strength and ROM in Rt foot and ankle, decreased activity tolerance, impaired gait and decreased functional mobility. She will benefit from skilled PT to address deficits and improve mobility with decreased pain.   OBJECTIVE IMPAIRMENTS: Abnormal gait, decreased activity tolerance, decreased balance, difficulty walking, decreased ROM, decreased strength, and pain.     GOALS: Goals reviewed with patient? Yes  SHORT TERM GOALS: Target date: 09/30/2024  Pt will be independent with initial HEP Baseline: Goal status: INITIAL  2.  Pt will report <= 6/10 pain at worst Baseline: 10/10 Goal status: INITIAL    LONG TERM GOALS: Target date: 10/28/2024  Pt will be independent in advanced HEP including return to walking and full silver sneakers routine Baseline:  Goal status: INITIAL  2.  Pt will improve Rt ankle strength to >= 4/5 to improve tolerance to walking Baseline:  Goal status: INITIAL  3.  Pt will improve PSFS average score to >= 5.5 to demo improved functional mobility Baseline:  Goal status: INITIAL  4.  Pt will tolerate SLS on Rt LE x 20 seconds without increase in pain Baseline:  Goal status: INITIAL    PLAN:  PT FREQUENCY: 1-2x/week  PT DURATION: 8 weeks  PLANNED INTERVENTIONS: 97164- PT Re-evaluation, 97110-Therapeutic exercises, 97530- Therapeutic activity, 97112- Neuromuscular re-education, 97535- Self Care, 02859- Manual therapy, [phone]- Gait training, [phone]- Aquatic Therapy, [phone]- Electrical stimulation (unattended), [id number]- Ultrasound, [id number]-  Ionotophoresis 4mg /ml Dexamethasone , 79439 (1-2 muscles), 20561 (3+ muscles)- Dry Needling, Patient/Family education, Balance training, Stair training, Taping, Cryotherapy, and Moist heat  PLAN FOR NEXT SESSION: keep open for 30 days before discharging   Lamarr GORMAN Price, PTA 09/18/2024, 2:02 PM  "

## 2024-10-21 ENCOUNTER — Ambulatory Visit: Admitting: Family Medicine

## 2025-05-05 ENCOUNTER — Ambulatory Visit
